# Patient Record
Sex: Male | Born: 1995 | Race: White | Hispanic: No | Marital: Single | Smoking: Former smoker
Health system: Southern US, Community
[De-identification: ages and names within clinical notes are randomized; demographics above are authoritative.]

## PROBLEM LIST (undated history)

## (undated) DIAGNOSIS — K219 Gastro-esophageal reflux disease without esophagitis: Secondary | ICD-10-CM

## (undated) DIAGNOSIS — J45909 Unspecified asthma, uncomplicated: Secondary | ICD-10-CM

## (undated) HISTORY — PX: ESOPHAGUS SURGERY: SHX626

---
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
## 2013-08-25 ENCOUNTER — Emergency Department (HOSPITAL_COMMUNITY)
Admission: EM | Admit: 2013-08-25 | Discharge: 2013-08-25 | Disposition: A | Discharge status: Home / Self Care | Payer: Self-pay | Attending: Emergency Medicine | Admitting: Emergency Medicine

## 2013-08-25 ENCOUNTER — Encounter (HOSPITAL_COMMUNITY): Payer: Self-pay | Admitting: Emergency Medicine

## 2013-08-25 DIAGNOSIS — L089 Local infection of the skin and subcutaneous tissue, unspecified: Secondary | ICD-10-CM | POA: Insufficient documentation

## 2013-08-25 DIAGNOSIS — Y929 Unspecified place or not applicable: Secondary | ICD-10-CM | POA: Insufficient documentation

## 2013-08-25 DIAGNOSIS — S80869A Insect bite (nonvenomous), unspecified lower leg, initial encounter: Principal | ICD-10-CM

## 2013-08-25 DIAGNOSIS — W57XXXA Bitten or stung by nonvenomous insect and other nonvenomous arthropods, initial encounter: Principal | ICD-10-CM

## 2013-08-25 DIAGNOSIS — L03119 Cellulitis of unspecified part of limb: Secondary | ICD-10-CM

## 2013-08-25 DIAGNOSIS — Y939 Activity, unspecified: Secondary | ICD-10-CM | POA: Insufficient documentation

## 2013-08-25 DIAGNOSIS — L02419 Cutaneous abscess of limb, unspecified: Secondary | ICD-10-CM | POA: Insufficient documentation

## 2013-08-25 DIAGNOSIS — F17200 Nicotine dependence, unspecified, uncomplicated: Secondary | ICD-10-CM | POA: Insufficient documentation

## 2013-08-25 MED ORDER — SULFAMETHOXAZOLE-TRIMETHOPRIM 800-160 MG PO TABS: 1.0000 | ORAL_TABLET | Freq: Two times a day (BID) | ORAL | Status: DC

## 2013-08-25 MED ORDER — CEPHALEXIN 500 MG PO CAPS: 500.0000 mg | ORAL_CAPSULE | Freq: Four times a day (QID) | ORAL | Status: DC

## 2013-08-25 MED ORDER — HYDROCODONE-ACETAMINOPHEN 5-325 MG PO TABS: 1.0000 | ORAL_TABLET | Freq: Four times a day (QID) | ORAL | Status: DC | PRN

## 2013-08-25 NOTE — Discharge Instructions (Signed)
Followup with your doctor, the ED, or an urgent care in 48-72 hours to have your wound checked. You may return to the emergency department if you have  a fever that persists greater than 101 or your abscess appears to become infected (growing surrounding redness and warmth). Do not operate any heavy machinery while on pain medications. Do not consume alcohol on these medications either.  Abscess An abscess (boil or furuncle) is an infected area that contains a collection of pus.  SYMPTOMS Signs and symptoms of an abscess include pain, tenderness, redness, or hardness. You may feel a moveable soft area under your skin. An abscess can occur anywhere in the body.  TREATMENT  A surgical cut (incision) may be made over your abscess to drain the pus. Gauze may be packed into the space or a drain may be looped through the abscess cavity (pocket). This provides a drain that will allow the cavity to heal from the inside outwards. The abscess may be painful for a few days, but should feel much better if it was drained.  Your abscess, if seen early, may not have localized and may not have been drained. If not, another appointment may be required if it does not get better on its own or with medications. HOME CARE INSTRUCTIONS   Only take over-the-counter or prescription medicines for pain, discomfort, or fever as directed by your caregiver.   Take your antibiotics as directed if they were prescribed. Finish them even if you start to feel better.   Keep the skin and clothes clean around your abscess.   If the abscess was drained, you will need to use gauze dressing to collect any draining pus. Dressings will typically need to be changed 3 or more times a day.   The infection may spread by skin contact with others. Avoid skin contact as much as possible.   Practice good hygiene. This includes regular hand washing, cover any draining skin lesions, and do not share personal care items.   If you participate in  sports, do not share athletic equipment, towels, whirlpools, or personal care items. Shower after every practice or tournament.   If a draining area cannot be adequately covered:   Do not participate in sports.   Children should not participate in day care until the wound has healed or drainage stops.   If your caregiver has given you a follow-up appointment, it is very important to keep that appointment. Not keeping the appointment could result in a much worse infection, chronic or permanent injury, pain, and disability. If there is any problem keeping the appointment, you must call back to this facility for assistance.  SEEK MEDICAL CARE IF:   You develop increased pain, swelling, redness, drainage, or bleeding in the wound site.   You develop signs of generalized infection including muscle aches, chills, fever, or a general ill feeling.   You have an oral temperature above 102 F (38.9 C).  MAKE SURE YOU:   Understand these instructions.   Will watch your condition.   Will get help right away if you are not doing well or get worse.  Document Released: 01/05/2005 Document Revised: 12/08/2010 Document Reviewed: 10/30/2007 [facility] Patient Information 2012 [place], [place].  Cellulitis Cellulitis is an infection of the skin and the tissue beneath it. The infected area is usually red and tender. Cellulitis occurs most often in the arms and lower legs.  CAUSES  Cellulitis is caused by bacteria that enter the skin through cracks or cuts  in the skin. The most common types of bacteria that cause cellulitis are Staphylococcus and Streptococcus. SYMPTOMS   Redness and warmth.  Swelling.  Tenderness or pain.  Fever. DIAGNOSIS  Your caregiver can usually determine what is wrong based on a physical exam. Blood tests may also be done. TREATMENT  Treatment usually involves taking an antibiotic medicine. HOME CARE INSTRUCTIONS   Take your antibiotics as directed. Finish them even if  you start to feel better.  Keep the infected arm or leg elevated to reduce swelling.  Apply a warm cloth to the affected area up to 4 times per day to relieve pain.  Only take over-the-counter or prescription medicines for pain, discomfort, or fever as directed by your caregiver.  Keep all follow-up appointments as directed by your caregiver. SEEK MEDICAL CARE IF:   You notice red streaks coming from the infected area.  Your red area gets larger or turns dark in color.  Your bone or joint underneath the infected area becomes painful after the skin has healed.  Your infection returns in the same area or another area.  You notice a swollen bump in the infected area.  You develop new symptoms. SEEK IMMEDIATE MEDICAL CARE IF:   You have a fever.  You feel very sleepy.  You develop vomiting or diarrhea.  You have a general ill feeling (malaise) with muscle aches and pains. MAKE SURE YOU:   Understand these instructions.  Will watch your condition.  Will get help right away if you are not doing well or get worse. Document Released: 01/05/2005 Document Revised: 09/27/2011 Document Reviewed: 06/13/2011 [facility] Patient Information 2014 [place], [place].

## 2013-08-25 NOTE — ED Notes (Signed)
Pt [place] on beach 1 week ago, and upon awakening found bite to right upper thigh. Area noted to be red, swelling at site. States area is progressively worsening. Pt denies fever/chills. NAD. AO x4.

## 2013-08-25 NOTE — ED Provider Notes (Signed)
CSN: [id number]     Arrival date & time 08/25/13  1622 History  This chart was scribed for non-physician practitioner, Arthor CaptainAbigail Ziyah Cordoba, PA-C working with Glynn OctaveStephen Rancour, MD by Greggory StallionKayla Andersen, ED scribe. This patient was seen in room TR09C/TR09C and the patient's care was started at 5:52 PM.   Chief Complaint  Patient presents with  . Insect Bite   The history is provided by the patient. No language interpreter was used.   HPI Comments: [name] is a 18 y.o. male who presents to the Emergency Department complaining of an insect bite to his right upper thigh that occurred one week ago. He states there is increased redness and swelling around the area. Pt states there has been some pus drainage from the area. Denies history of IV drug use.   History reviewed. No pertinent past medical history. History reviewed. No pertinent past surgical history. No family history on file. History  Substance Use Topics  . Smoking status: Current Every Day Smoker -- 0.50 packs/day    Types: Cigarettes  . Smokeless tobacco: Not on file  . Alcohol Use: Yes    Review of Systems  Constitutional: Negative for fever.  HENT: Negative for congestion.   Eyes: Negative for redness.  Respiratory: Negative for shortness of breath.   Cardiovascular: Negative for chest pain.  Gastrointestinal: Negative for abdominal distention.  Musculoskeletal: Negative for gait problem.  Skin: Negative for rash.       Abscess.   Neurological: Negative for speech difficulty.  Psychiatric/Behavioral: Negative for confusion.   Allergies  Review of patient's allergies indicates no known allergies.  Home Medications   Prior to Admission medications   Not on File   BP 127/81  Pulse 96  Temp(Src) 98.7 F (37.1 C) (Oral)  Resp 18  SpO2 99%  Physical Exam  Nursing note and vitals reviewed. Constitutional: He is oriented to person, place, and time. He appears well-developed and well-nourished. No distress.  HENT:   Head: Normocephalic and atraumatic.  Eyes: EOM are normal.  Neck: Neck supple. No tracheal deviation present.  Cardiovascular: Normal rate.   Pulmonary/Chest: Effort normal. No respiratory distress.  Musculoskeletal: Normal range of motion.  Neurological: He is alert and oriented to person, place, and time.  Skin: Skin is warm and dry.  Right thigh with 10 cm of induration surrounded by 20 cm of poorly demarcated erythema consistent with cellulitis. Tender to palpation. Expressible purulence and crusting centrally.   Psychiatric: He has a normal mood and affect. His behavior is normal.    ED Course  Procedures (including critical care time)  DIAGNOSTIC STUDIES: Oxygen Saturation is 99% on RA, normal by my interpretation.    COORDINATION OF CARE: 5:55 PM-Discussed treatment plan which includes I&D with pt at bedside and pt agreed to plan.   INCISION AND DRAINAGE Performed by: Arthor CaptainAbigail Les Longmore, PA-C Consent: Verbal consent obtained. Risks and benefits: risks, benefits and alternatives were discussed Type: abscess  Body area: right thigh  Anesthesia: local infiltration  Incision was made with a scalpel.  Local anesthetic: lidocaine 2% without epinephrine  Anesthetic total: 5 ml  Complexity: complex Blunt dissection to break up loculations  Drainage: purulent  Drainage amount: copious  Packing material: none  Patient tolerance: Patient tolerated the procedure well with no immediate complications.  Labs Review Labs Reviewed  CULTURE, ROUTINE-ABSCESS    Imaging Review No results found.   EKG Interpretation None      MDM   Final diagnoses:  Cellulitis and abscess  of leg   Patient with Abscess of the R  Thigh. I &D performed . Wound culture sent. D/c with bactrim and keflex for surrounding cellulitis. hte wound has been demarcated. patietn is to follow up in 2 days for a wound recheck.  I personally performed the services described in this documentation,  which was scribed in my presence. The recorded information has been reviewed and is accurate.  Arthor CaptainAbigail Omario Ander, PA-C 08/30/13 [phone]

## 2013-08-28 ENCOUNTER — Telehealth (HOSPITAL_BASED_OUTPATIENT_CLINIC_OR_DEPARTMENT_OTHER): Payer: Self-pay

## 2013-08-28 LAB — CULTURE, ROUTINE-ABSCESS: Special Requests: NORMAL

## 2013-08-28 NOTE — Telephone Encounter (Addendum)
[facility]  (+) MRSA.  Rx given in ED for Sulfa-Trimeth -> sensitive to the same and Keflex -> no sensitivity for this drug provided.  Call and notify pt.  General FYI set for MRSA Hx.  [facility]

## 2013-08-29 NOTE — Telephone Encounter (Signed)
Post ED Visit - Positive Culture Follow-up  Culture report reviewed by antimicrobial stewardship pharmacist: []  [name], Pharm.D., BCPS []  [name], Pharm.D., BCPS []  [name], Pharm.D., BCPS [x]  [name], [place]Pharm.D., BCPS, AAHIVP []  Estella HuskMichelle Larsen, Pharm.D., BCPS, AAHIVP []  [name], Pharm.D.  Positive abscess culture Treated with Sulfa-Trimeth, organism sensitive to the same and no further patient follow-up is required at this time.  [name] 08/29/2013, 11:50 AM

## 2013-08-30 NOTE — ED Provider Notes (Signed)
Medical screening examination/treatment/procedure(s) were performed by non-physician practitioner and as supervising physician I was immediately available for consultation/collaboration.   EKG Interpretation None       Glynn Octave, MD 08/30/13 1052

## 2013-09-06 ENCOUNTER — Telehealth (HOSPITAL_BASED_OUTPATIENT_CLINIC_OR_DEPARTMENT_OTHER): Payer: Self-pay | Admitting: Emergency Medicine

## 2013-09-06 NOTE — Telephone Encounter (Signed)
patient return call after receiving letter. ID verified. Notified of + abscess culture +MRSA. and that treated with prescribed Bactrim. Patient reports he is improved.

## 2014-12-18 ENCOUNTER — Emergency Department (HOSPITAL_COMMUNITY)
Admission: EM | Admit: 2014-12-18 | Discharge: 2014-12-18 | Disposition: A | Discharge status: Home / Self Care | Payer: Self-pay | Attending: Emergency Medicine | Admitting: Emergency Medicine

## 2014-12-18 ENCOUNTER — Emergency Department (HOSPITAL_COMMUNITY): Payer: Self-pay

## 2014-12-18 ENCOUNTER — Encounter (HOSPITAL_COMMUNITY): Payer: Self-pay | Admitting: Emergency Medicine

## 2014-12-18 DIAGNOSIS — R071 Chest pain on breathing: Secondary | ICD-10-CM

## 2014-12-18 DIAGNOSIS — Z792 Long term (current) use of antibiotics: Secondary | ICD-10-CM | POA: Insufficient documentation

## 2014-12-18 DIAGNOSIS — Z720 Tobacco use: Secondary | ICD-10-CM | POA: Insufficient documentation

## 2014-12-18 DIAGNOSIS — F1210 Cannabis abuse, uncomplicated: Secondary | ICD-10-CM | POA: Insufficient documentation

## 2014-12-18 DIAGNOSIS — R079 Chest pain, unspecified: Secondary | ICD-10-CM | POA: Insufficient documentation

## 2014-12-18 LAB — URINALYSIS, ROUTINE W REFLEX MICROSCOPIC
BILIRUBIN URINE: NEGATIVE
Glucose, UA: NEGATIVE mg/dL
Hgb urine dipstick: NEGATIVE
Ketones, ur: NEGATIVE mg/dL
Leukocytes, UA: NEGATIVE
Nitrite: NEGATIVE
PH: 6.5 (ref 5.0–8.0)
Protein, ur: NEGATIVE mg/dL
Specific Gravity, Urine: 1.026 (ref 1.005–1.030)
Urobilinogen, UA: 1 mg/dL (ref 0.0–1.0)

## 2014-12-18 LAB — RAPID URINE DRUG SCREEN, HOSP PERFORMED
Amphetamines: NOT DETECTED
BARBITURATES: NOT DETECTED
Benzodiazepines: NOT DETECTED
Cocaine: NOT DETECTED
Opiates: NOT DETECTED
TETRAHYDROCANNABINOL: POSITIVE — AB

## 2014-12-18 LAB — BASIC METABOLIC PANEL
Anion gap: 7 (ref 5–15)
BUN: 16 mg/dL (ref 6–20)
CO2: 31 mmol/L (ref 22–32)
Calcium: 10 mg/dL (ref 8.9–10.3)
Chloride: 99 mmol/L — ABNORMAL LOW (ref 101–111)
Creatinine, Ser: 0.98 mg/dL (ref 0.61–1.24)
GFR calc Af Amer: >60 mL/min (ref >60)
Glucose, Bld: 80 mg/dL (ref 65–99)
Potassium: 4.2 mmol/L (ref 3.5–5.1)
Sodium: 137 mmol/L (ref 135–145)

## 2014-12-18 LAB — CBC WITH DIFFERENTIAL/PLATELET
BASOS ABS: 0 10*3/uL (ref 0.0–0.1)
Basophils Relative: 0 % (ref 0–1)
Eosinophils Absolute: 0.2 10*3/uL (ref 0.0–0.7)
Eosinophils Relative: 3 % (ref 0–5)
HCT: 47.4 % (ref 39.0–52.0)
Hemoglobin: 16.5 g/dL (ref 13.0–17.0)
Lymphocytes Relative: 48 % — ABNORMAL HIGH (ref 12–46)
Lymphs Abs: 2.8 10*3/uL (ref 0.7–4.0)
MCH: 31.2 pg (ref 26.0–34.0)
MCHC: 34.8 g/dL (ref 30.0–36.0)
MCV: 89.6 fL (ref 78.0–100.0)
Monocytes Absolute: 0.6 10*3/uL (ref 0.1–1.0)
Monocytes Relative: 10 % (ref 3–12)
Neutro Abs: 2.3 10*3/uL (ref 1.7–7.7)
Neutrophils Relative %: 39 % — ABNORMAL LOW (ref 43–77)
PLATELETS: 192 10*3/uL (ref 150–400)
RBC: 5.29 MIL/uL (ref 4.22–5.81)
RDW: 12.5 % (ref 11.5–15.5)
WBC: 5.8 10*3/uL (ref 4.0–10.5)

## 2014-12-18 LAB — TSH: TSH: 0.866 u[IU]/mL (ref 0.350–4.500)

## 2014-12-18 LAB — TROPONIN I

## 2014-12-18 MED ORDER — IBUPROFEN 800 MG PO TABS: 800.0000 mg | ORAL_TABLET | Freq: Three times a day (TID) | ORAL | Status: DC

## 2014-12-18 NOTE — Discharge Instructions (Signed)
1. Medications: ibuprofen, usual home medications 2. Treatment: rest, drink plenty of fluids  3. Follow Up: please followup with your primary doctor  for discussion of your diagnoses and further evaluation after today's visit; if you do not have a primary care doctor use the resource guide provided to find one; please followup with cardiology if symptoms do not improve in 3-4 days; please return to the ER for severe chest pain, shortness of breath, fever   Chest Pain (Nonspecific) It is often hard to give a diagnosis for the cause of chest pain. There is always a chance that your pain could be related to something serious, such as a heart attack or a blood clot in the lungs. You need to follow up with your doctor. HOME CARE  If antibiotic medicine was given, take it as directed by your doctor. Finish the medicine even if you start to feel better.  For the next few days, avoid activities that bring on chest pain. Continue physical activities as told by your doctor.  Do not use any tobacco products. This includes cigarettes, chewing tobacco, and e-cigarettes.  Avoid drinking alcohol.  Only take medicine as told by your doctor.  Follow your doctor's suggestions for more testing if your chest pain does not go away.  Keep all doctor visits you made. GET HELP IF:  Your chest pain does not go away, even after treatment.  You have a rash with blisters on your chest.  You have a fever. GET HELP RIGHT AWAY IF:   You have more pain or pain that spreads to your arm, neck, jaw, back, or belly (abdomen).  You have shortness of breath.  You cough more than usual or cough up blood.  You have very bad back or belly pain.  You feel sick to your stomach (nauseous) or throw up (vomit).  You have very bad weakness.  You pass out (faint).  You have chills. This is an emergency. Do not wait to see if the problems will go away. Call your local emergency services (911 in U.S.). Do not drive  yourself to the hospital. MAKE SURE YOU:   Understand these instructions.  Will watch your condition.  Will get help right away if you are not doing well or get worse. Document Released: 09/14/2007 Document Revised: 04/02/2013 Document Reviewed: 09/14/2007 [facility] Patient Information 2015 [place], [place]. This information is not intended to replace advice given to you by your health care provider. Make sure you discuss any questions you have with your health care provider.   Emergency Department Resource Guide 1) Find a Doctor and Pay Out of Pocket Although you won't have to find out who is covered by your insurance plan, it is a good idea to ask around and get recommendations. You will then need to call the office and see if the doctor you have chosen will accept you as a new patient and what types of options they offer for patients who are self-pay. Some doctors offer discounts or will set up payment plans for their patients who do not have insurance, but you will need to ask so you aren't surprised when you get to your appointment.  2) Contact Your Local Health Department Not all health departments have doctors that can see patients for sick visits, but many do, so it is worth a call to see if yours does. If you don't know where your local health department is, you can check in your phone book. The CDC also has a tool  to help you locate your state's health department, and many state websites also have listings of all of their local health departments.  3) Find a Walk-in Clinic If your illness is not likely to be very severe or complicated, you may want to try a walk in clinic. These are popping up all over the country in pharmacies, drugstores, and shopping centers. They're usually staffed by nurse practitioners or physician assistants that have been trained to treat common illnesses and complaints. They're usually fairly quick and inexpensive. However, if you have serious medical issues or  chronic medical problems, these are probably not your best option.  No Primary Care Doctor: - Call Health Connect at  [phone] - they can help you locate a primary care doctor that  accepts your insurance, provides certain services, etc. - Physician Referral Service- [phone]  Chronic Pain Problems: Organization         Address  Phone   Notes  [facility]  [phone] Patients need to be referred by their primary care doctor.   Medication Assistance: Organization         Address  Phone   Notes  [facility] Medication [facility] [address] [place] [place], [place] [address] [phone] --Must be a resident of [facility] -- Must have NO insurance coverage whatsoever (no Medicaid/ Medicare, etc.) -- The pt. MUST have a primary care doctor that directs their care regularly and follows them in the community   MedAssist  [phone]   [employer]  [phone]    Agencies that provide inexpensive medical care: Organization         Address  Phone   Notes  [facility]  [phone]   [facility]    [phone]   [facility] [address] [place], [place] [address] [phone]   [facility] [address] [place] [place] [phone]   Planned Parenthood    [phone]   [facility]    [phone]   Community Health and [facility]  [address], [place] Phone:  [phone], Fax:  [phone] Hours of Operation:  9 am - 6 pm, M-F.  Also accepts Medicaid/Medicare and self-pay.  [facility]  [address], [place] Phone: [phone], Fax: [phone]. Hours of Operation:  8:30 am - 5:30 pm, M-F.  Also accepts Medicaid and self-pay.  [facility] [address], [place] Phone: [phone]   [facility] [address] Natasha Bence [place], [place]  [phone], Ext. [phone] Mondays & Thursdays: 7-9 AM.  First 15 patients are seen on a first come, first serve basis.    Medicaid-accepting [facility] Providers:  Organization         Address  Phone   Notes  [facility] [address], [place] [phone] Also accepts self-pay patients.  [facility] [address] Laurell Josephs [place], [place]  [phone]   [facility] [address], [place] [phone]   [facility] [address], [place] [phone]   Renaye Rakers [address], [place]   [phone] Only accepts [place] Access [place] patients after they have their name applied to their card.   Self-Pay (no insurance) in [place]:  Organization         Address  Phone   Notes  Sickle Cell Patients, [facility] Internal Medicine [place] [phone]   [facility] Care [place] [phone]   [facility] [place]  [place], [place] [phone]   [facility]/Dr. Osei-Bonsu  [address], [place] or [address], [place] [phone] Phone number for both [place] and [place] locations is the same.  Urgent Medical and [facility] [address] [place] [phone]   [facility] [address] [place] or [address] [phone]   [facility] [address], [place] [phone], phone; [phone], fax Sees patients 1st and 3rd Saturday of every month.  Must not qualify for public or private insurance (i.e. Medicaid, Medicare, [place] Health Choice, Veterans' Benefits)  Household income should be no more than 200% of the poverty level The clinic cannot treat you if you are pregnant or think you are pregnant  Sexually transmitted  diseases are not treated at the clinic.    Dental Care: Organization         Address  Phone  Notes  [facility] Department of [facility] [phone] Accepts children up to age 52 who are enrolled in [place] or [place]; pregnant women with a Medicaid card; and children who have applied for Medicaid or [place] Health Choice, but were declined, whose parents can pay a reduced fee at time of service.  [facility] Department of [facility]  [address], [place] [phone] Accepts children up to age 45 who are enrolled in [place] or [place]; pregnant women with a Medicaid card; and children who have applied for Medicaid or [place] Health Choice, but were declined, whose parents can pay a reduced fee at time of service.  [place] Adult [facility] Access PROGRAM  [place] [phone] Patients are seen by appointment only. Walk-ins are not accepted. [place] will see patients 47 years of age and older. Monday - Tuesday (8am-5pm) Most Wednesdays (8:30-5pm) $30 per visit, cash only  [facility] Adult Dental Access PROGRAM  [address], [facility] [phone] Patients are seen by appointment only. Walk-ins are not accepted. [place] will see patients 20 years of age and older. One Wednesday Evening (Monthly: Volunteer Based).  $30 per visit, cash only  [place]  [phone] for adults; Children under age 27, call Graduate Pediatric Dentistry at [phone]. Children aged 77-14, please call [phone] to request a pediatric application.  Dental services are provided in all areas of dental care including fillings, crowns and bridges, complete and partial dentures, implants, gum treatment, root canals, and extractions. Preventive care is also provided. Treatment is provided to both adults and children. Patients are selected via a  lottery and there is often a waiting list.   [facility] [address], [place]  [phone] [email or web address]   [facility] [address] [place], [place] [phone], Ext. [phone] Second and Fourth Thursday of each month, opens at 6:30 AM; Clinic ends at 9 AM.  Patients are seen on a first-come first-served basis, and a limited number are seen during each clinic.  [facility]  [address] Hillard Danker [place], [place] [phone]   Eligibility Requirements You must have lived in [place], [place], or [place] counties for at least the last three months.   You cannot be eligible for state or federal sponsored [employer], including [employer], [place], or [employer].   You generally cannot be eligible for healthcare insurance through your employer.    How to apply: Eligibility screenings are held every Tuesday and Wednesday afternoon from 1:00 pm until 4:00 pm. You do not need an appointment for the interview!  [facility] [address], [place], [place]   [place]  [place] Department  [place]  [phone]    Behavioral Health Resources in the Community: Intensive Outpatient Programs Organization         Address  Phone  Notes  [place]. [address], [place], [place] [phone]   [facility]patient [address] [place], [place]   ADS: Alcohol & Drug S[facility] [address] [place], [place]   [place] [address]  [place], [place] or [phone]   Substance Abuse Resources Organization         Address  Phone  Notes  Alcohol and Drug Services  [phone]   [place]  [phone]   [facility] [place]   Chinita Pester  [phone]   Residential &  Outpatient Substance Abuse Program  [phone]   Psychological Services Organization         Address  Phone  Notes  [facility]  [place]  [phone]   [place] [address], [place] or [phone]    Mobile Crisis Teams Organization         Address  Phone  Notes  [facility], Mobile Crisis Care Unit  [phone]   [facility]  [address] [place], [place]   [place] [address] [place] [phone]    Self-Help/Support Groups Organization         Address  Phone             Notes  [place]. of [place] - variety of support groups  [place] Call for more information  Narcotics Anonymous (NA), Caring [facility] [address], [employer] [place]  2 meetings at this location   Special educational needs teacher         Address  Phone  Notes  ASAP Residential Treatment [place],    [place]  [phone]   [facility]  [address] [place] [address], [place] [phone], [place], [place]   [place], [place] [phone] Admissions: 8am-3pm M-F  Incentives Substance [place] [address],    [place], [place] [phone]   [facility] [address] Jadene Pierini [place], [place]   [facility] [address],  [place], [place]   [facility]ight Programs - Intensive Outpatient [address] Kristeen [address], [place], [place]   [facility] ([place].) [place].,  [place], [place] or [phone]   [facility] (RTS) [address] [place], [place] Accepts Medicaid  [facility] [place] [address],  [place] [phone] Substance Abuse/Addiction Treatment  [facility] Organization          Address  Phone  Notes  [facility]  [phone]   Angie Fava, PhD [address] Ervin Knack [place], [place]   [phone] or [phone]   [facility] Behavioral   [address] [employer], [place] [phone]   [facility] [address] [place], [place] [phone] Insurance/Medicaid/sponsorship through [facility] and Families [address]                                    [place], [place] [phone] Therapy/tele-psych/case  [facility] [address], [place] [phone]    Dr. Lolly Mustache  [phone]   [facility]  [facility] Dept. 1) [address] [place] 2) [address], [place] 3)  [address] [place] [address], [place] [phone]  [phone]   [facility] Child Abuse Hotline 417-067-2668 or [phone] (After Hours)

## 2014-12-18 NOTE — ED Notes (Signed)
Patient states intermittent left sided chest pain associated with activity

## 2014-12-18 NOTE — ED Notes (Signed)
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 Smoker.

## 2014-12-18 NOTE — ED Notes (Signed)
Patient transported to X-ray 

## 2014-12-18 NOTE — Consult Note (Signed)
CARDIOLOGY CONSULT NOTE   Patient ID: [name] MRN: [id number] DOB/AGE: [birth year] 19 y.o.  Admit date: 12/18/2014  Primary Physician   No PCP Per Patient Primary Cardiologist   New Reason for Consultation   Chest pain  HPI: [name] is a 19 y.o. male with no significant past medical history except for his substance abuse. He smoke cigarette  half a pack a day x 5 years. Last cocaine use 1 month ago, last marijuana use last night. He works outside [place] and dose heavy lifting. Intermittently he has been having sharp left-sided chest pain with exertion for the past few weeks. No shortness of breath. No chest pain at rest. His chest pain are precipitated by deep breath. He denies fever, cough, recent illness, shortness of breath, lower extremity edema, palpitations, nausea, vomiting, orthopnea or PND. No recent trauma or travel.   He had a cystourethrogram and renal ultrasound done 10 years ago for urgent care. No history of syncope. No history of surgery or malignancy. He does not know any family history of heart disease, hypertension or diabetes.  In ED, his lab woks are unremarkable, Trop I x 1 negative. EKG with NSR with PVCs and incomplete RBBB. Tele showed ventricular bigeminy with rate of 40-80s. UDS positive for Marijuana. Cxr without acute abnormality. UA negative.     No Known Allergies  I have reviewed the patient's current medications     Prior to Admission medications   Medication Sig Start Date End Date Taking? Authorizing Provider  HYDROcodone-acetaminophen (NORCO) 5-325 MG per tablet Take 1-2 tablets by mouth every 6 (six) hours as needed for moderate pain. 08/25/13  Yes Arthor Captain, PA-C  cephALEXin (KEFLEX) 500 MG capsule Take 1 capsule (500 mg total) by mouth 4 (four) times daily. Patient not taking: Reported on 12/18/2014 08/25/13   Arthor Captain, PA-C  sulfamethoxazole-trimethoprim (SEPTRA DS) 800-160 MG per tablet Take 1 tablet by mouth every 12  (twelve) hours. Patient not taking: Reported on 12/18/2014 08/25/13   Arthor Captain, PA-C     Social History   Social History  . Marital Status: Married    Spouse Name: N/A  . Number of Children: N/A  . Years of Education: N/A   Occupational History  . Not on file.   Social History Main Topics  . Smoking status: Current Every Day Smoker -- 0.50 packs/day    Types: Cigarettes  . Smokeless tobacco: Not on file  . Alcohol Use: Yes  . Drug Use: Not on file  . Sexual Activity: Yes   Other Topics Concern  . Not on file   Social History Narrative    Patient dose not know about any family history.   ROS:  Full 14 point review of systems complete and found to be negative unless listed above.  Physical Exam: Blood pressure 104/56, pulse 29, temperature 97.4 F (36.3 C), temperature source Oral, resp. rate 16, SpO2 95 %.  General: Well developed, well nourished, male in no acute distress Head: Eyes PERRLA, No xanthomas. Normocephalic and atraumatic, oropharynx without edema or exudate.  Lungs: Resp regular and unlabored, CTA. Heart: RRR no s3, s4, or murmurs..   Neck: No carotid bruits. No lymphadenopathy.  No JVD. Abdomen: Bowel sounds present, abdomen soft and non-tender without masses or hernias noted. Msk:  No spine or cva tenderness. No weakness, no joint deformities or effusions. Extremities: No clubbing, cyanosis or edema. DP/PT/Radials 2+ and equal bilaterally. Neuro: Alert and oriented X 3. No focal  deficits noted. Psych:  Good affect, responds appropriately Skin: No rashes or lesions noted.  Labs:   Lab Results  Component Value Date   WBC 5.8 12/18/2014   HGB 16.5 12/18/2014   HCT 47.4 12/18/2014   MCV 89.6 12/18/2014   PLT 192 12/18/2014   No results for input(s): INR in the last 72 hours.  Recent Labs Lab 12/18/14 1231  NA 137  K 4.2  CL 99*  CO2 31  BUN 16  CREATININE 0.98  CALCIUM 10.0  GLUCOSE 80    ECG:   Vent. rate 92 BPM PR interval 132  ms QRS duration 94 ms QT/QTc 380/469 ms P-R-T axes 78 111 72  Radiology:  Dg Chest 2 View  12/18/2014   CLINICAL DATA:  Dyspnea, smoker  EXAM: CHEST  2 VIEW  COMPARISON:  None.  FINDINGS: The heart size and mediastinal contours are within normal limits. Both lungs are clear. The visualized skeletal structures are unremarkable.  IMPRESSION: No active cardiopulmonary disease.   Electronically Signed   By: Natasha Mead M.D.   On: 12/18/2014 12:59    ASSESSMENT AND PLAN:     1. Atypical chest pain - Seems pleuritic vs MSK  that worse with deep breath. Low suspicious for PE or cardiac etiology. Trop I x 1 negative. EKG with NSR with PVCs and incomplete RBBB. Tele showed ventricular bigeminy with rate of 40-80s and frequent PVCs. CXR without acute abnormality.    2. Polysubstance abuse - At extensive discussion (15-20 minutes) about cessation of tobacco, cocaine and marijuana cessation. Patient understands risk at such a young age. He is willing to quit. - UDS positive for Marijuana.   SignedManson Passey, PA 12/18/2014, 2:59 PM Pager [phone]  Co-Sign MD  Patient seen with PA, agree with the above note. 1. Chest pain: Began after swinging heavy pallets onto a truck.  Pleuritic, only present with deep breathing and sneezing.  Localizes to a small area of his left chest.  No friction rub or evidence for pericarditis on ECG.  CXR, troponin ok.  I suspect this is musculoskeletal.  If it does not resolve in 3-4 more days, we can do an outpatient echo.  2. PVCs: PVCs noted on telemetry and ECG.  He has a large, fully consumed Twitch energy drink on bedside table.  No cocaine on urine drug screen.  Advised him to avoid stimulants => energy drinks, cocaine, etc.   Marca Ancona 12/18/2014 3:29 PM

## 2014-12-18 NOTE — ED Provider Notes (Signed)
CSN: [id number]     Arrival date & time 12/18/14  0940 History   First MD Initiated Contact with Patient 12/18/14 1123     Chief Complaint  Patient presents with  . Chest Pain     HPI   [name] is a 19 y.o. male with a PMH of tobacco use who presents to the ED with chest pain x 3 days. Reports his pain over the left side of his chest and feels like pressure. At rest, has no chest pain, however symptoms are precipitated by deep inspiration. He also reports symptoms when he is exerting himself at work. 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 He has not tried anything for symptom relief. He states this has never happened to him before. Denies fever, chills, HA, lightheadedness, dizziness, cough, congestion, shortness of breath, palpitations, abdominal pain, vomiting, diarrhea, constipation. Reports intermittent nausea. Denies weakness, numbness, paresthesia. States he smokes 1/2 pack per day x 5 years. Denies history of malignancy, history of DVT, recent travel or immobility.   History reviewed. No pertinent past medical history. History reviewed. No pertinent past surgical history. History reviewed. No pertinent family history.   Social History  Substance Use Topics  . Smoking status: Current Every Day Smoker -- 0.50 packs/day    Types: Cigarettes  . Smokeless tobacco: None  . Alcohol Use: Yes     Review of Systems  Constitutional: Negative for fever, chills, activity change, appetite change and fatigue.  HENT: Negative for congestion.   Respiratory: Positive for chest tightness. Negative for cough and shortness of breath.   Cardiovascular: Positive for chest pain. Negative for palpitations and leg swelling.  Gastrointestinal: Negative for nausea, vomiting, abdominal pain, diarrhea and constipation.  Musculoskeletal: Negative for myalgias, back pain, arthralgias, neck pain and neck stiffness.  Skin: Negative for color change, pallor, rash and wound.  Neurological:  Negative for dizziness, syncope, weakness, light-headedness, numbness and headaches.  All other systems reviewed and are negative.     Allergies  Review of patient's allergies indicates no known allergies.  Home Medications   Prior to Admission medications   Medication Sig Start Date End Date Taking? Authorizing Provider  cephALEXin (KEFLEX) 500 MG capsule Take 1 capsule (500 mg total) by mouth 4 (four) times daily. 08/25/13   Arthor Captain, PA-C  HYDROcodone-acetaminophen (NORCO) 5-325 MG per tablet Take 1-2 tablets by mouth every 6 (six) hours as needed for moderate pain. 08/25/13   Arthor Captain, PA-C  sulfamethoxazole-trimethoprim (SEPTRA DS) 800-160 MG per tablet Take 1 tablet by mouth every 12 (twelve) hours. 08/25/13   Abigail Harris, PA-C    BP 104/59 mmHg  Pulse 81  Temp(Src) 97.4 F (36.3 C) (Oral)  Resp 18  SpO2 98% Physical Exam  Constitutional: He is oriented to person, place, and time. He appears well-developed and well-nourished. No distress.  HENT:  Head: Normocephalic and atraumatic.  Right Ear: External ear normal.  Left Ear: External ear normal.  Nose: Nose normal.  Mouth/Throat: Uvula is midline, oropharynx is clear and moist and mucous membranes are normal.  Eyes: Conjunctivae, EOM and lids are normal. Pupils are equal, round, and reactive to light. Right eye exhibits no discharge. Left eye exhibits no discharge. No scleral icterus.  Neck: Normal range of motion. Neck supple.  Cardiovascular: Normal rate, regular rhythm, intact distal pulses and normal pulses.   Regular rate and rhythm with occasional ectopy. Split S2.  Pulmonary/Chest: Effort normal and breath sounds normal. No respiratory distress. He  has no wheezes. He has no rales. He exhibits no tenderness.  Abdominal: Soft. Normal appearance and bowel sounds are normal. He exhibits no distension and no mass. There is no tenderness. There is no rigidity, no rebound and no guarding.  Musculoskeletal:  Normal range of motion. He exhibits no edema or tenderness.  Neurological: He is alert and oriented to person, place, and time. He has normal strength.  Skin: Skin is warm, dry and intact. No rash noted. He is not diaphoretic. No erythema. No pallor.  Psychiatric: He has a normal mood and affect. His speech is normal and behavior is normal. Judgment and thought content normal.  Nursing note and vitals reviewed.   ED Course  Procedures (including critical care time)  Labs Review Labs Reviewed  CBC WITH DIFFERENTIAL/PLATELET - Abnormal; Notable for the following:    Neutrophils Relative % 39 (*)    Lymphocytes Relative 48 (*)    All other components within normal limits  BASIC METABOLIC PANEL - Abnormal; Notable for the following:    Chloride 99 (*)    All other components within normal limits  URINALYSIS, ROUTINE W REFLEX MICROSCOPIC (NOT AT [facility]) - Abnormal; Notable for the following:    APPearance HAZY (*)    All other components within normal limits  URINE RAPID DRUG SCREEN, HOSP PERFORMED - Abnormal; Notable for the following:    Tetrahydrocannabinol POSITIVE (*)    All other components within normal limits  TROPONIN I  TSH    Imaging Review Dg Chest 2 View  12/18/2014   CLINICAL DATA:  Dyspnea, smoker  EXAM: CHEST  2 VIEW  COMPARISON:  None.  FINDINGS: The heart size and mediastinal contours are within normal limits. Both lungs are clear. The visualized skeletal structures are unremarkable.  IMPRESSION: No active cardiopulmonary disease.   Electronically Signed   By: Natasha Mead M.D.   On: 12/18/2014 12:59     I have personally reviewed and evaluated these images and lab results as part of my medical decision-making.   EKG Interpretation   Date/Time:  Thursday December 18 2014 09:57:05 EDT Ventricular Rate:  87 PR Interval:  132 QRS Duration: 94 QT Interval:  378 QTC Calculation: 454 R Axis:   117 Text Interpretation:  Sinus rhythm with sinus arrhythmia with frequent   Premature ventricular complexes Right atrial enlargement Right axis  deviation Pulmonary disease pattern Incomplete right bundle branch block  Abnormal ECG agree. 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 Denies fever, chills, HA, lightheadedness, dizziness, cough, congestion, shortness of breath, palpitations, abdominal pain, vomiting, diarrhea, constipation, weakness, numbness, paresthesia. States he smokes 1/2 pack per day x 5 years. Denies history of malignancy, history of DVT, recent travel or immobility.  Patient is afebrile. Vital signs stable. Regular rate and rhythm with occasional ectopy and split S2. Lungs clear to auscultation bilaterally. Chest wall non-tender to palpation. Abdomen soft, non-tender, non-distended. No lower extremity edema or TTP.  EKG with frequent PVCs, right atrial enlargement, right axis deviation. Troponin negative x 1. No evidence of ACS or pericarditis. CBC, BMP unremarkable. TSH within normal limits. UA negative for infection, UDS positive for THC. CXR negative for active cardiopulmonary disease.   Cardiology consulted; per note, feel that  chest pain is likely musculoskeletal in etiology with low suspicion for PE or cardiac source of symptoms; if pain does not resolve, patient to follow up with cardiology for outpatient echo; patient counseled on smoking and drug cessation and advised not to consume stimulants.  Patient to follow-up with PCP. Return precautions discussed.   BP 102/76 mmHg  Pulse 60  Temp(Src) 97.4 F (36.3 C) (Oral)  Resp 18  SpO2 99%       Mady Gemma, PA-C 12/18/14 1701  Arby Barrette, MD 12/18/14 1752

## 2017-07-03 ENCOUNTER — Ambulatory Visit (HOSPITAL_COMMUNITY)
Admission: EM | Admit: 2017-07-03 | Discharge: 2017-07-03 | Disposition: A | Discharge status: Home / Self Care | Payer: Self-pay | Attending: Family Medicine | Admitting: Family Medicine

## 2017-07-03 ENCOUNTER — Encounter (HOSPITAL_COMMUNITY): Payer: Self-pay | Admitting: Family Medicine

## 2017-07-03 DIAGNOSIS — R69 Illness, unspecified: Secondary | ICD-10-CM

## 2017-07-03 DIAGNOSIS — J111 Influenza due to unidentified influenza virus with other respiratory manifestations: Secondary | ICD-10-CM

## 2017-07-03 MED ORDER — OSELTAMIVIR PHOSPHATE 75 MG PO CAPS: 75.0000 mg | ORAL_CAPSULE | Freq: Two times a day (BID) | ORAL | 0 refills | Status: DC

## 2017-07-03 NOTE — Discharge Instructions (Signed)
If this does not get better, you need to find a PCP to follow up. Even if it does get better, it is not a bad idea to find one.  OK to take Tylenol 1000 mg (2 extra strength tabs) or 975 mg (3 regular strength tabs) every 6 hours as needed.  Ibuprofen 400-600 mg (2-3 over the counter strength tabs) every 6 hours as needed for pain.  Continue to push fluids, practice good hand hygiene, and cover your mouth if you cough.  If you start having fevers, shaking or shortness of breath, seek immediate care.

## 2017-07-03 NOTE — ED Triage Notes (Signed)
Pt here for flu like symptoms he was exposed at work. Sweats, fatigue, loss of appetite. This has been intermittent x a few weeks.

## 2017-07-03 NOTE — ED Provider Notes (Signed)
  [facility]    CSN: [id number] Arrival date & time: 07/03/17  1840   Chief Complaint  Patient presents with  . Influenza    [name] here for URI complaints.  Duration: 1 day  Associated symptoms: sinus congestion, rhinorrhea and myalgia Denies: sinus pain, itchy watery eyes, ear pain, ear drainage, sore throat, wheezing, shortness of breath and fevers Treatment to date: none Sick contacts: Yes- coworkers  ROS:  Const: Denies fevers HEENT: As noted in HPI Lungs: No SOB  No known health hx.   BP 118/73   Pulse 70   Temp 97.7 F (36.5 C)   Resp 18   SpO2 97%  General: Awake, alert, appears stated age HEENT: AT, [place], ears patent b/l and TM's neg, nares patent w/o discharge, pharynx pink and without exudates, MMM Neck: No masses or asymmetry Heart: RRR Lungs: CTAB, no accessory muscle use Abd: Neg Psych: Age appropriate judgment and insight, normal mood and affect  Influenza-like illness  Tamiflu.  Pt has been under a lot of stress and bringing up somatic issues. I think treating for flu is reasonable, but he needs to follow up with a pcp if things do not improve. I think underlying and untreated anxiety could be a strong contributor to his issues at hand.  Continue to push fluids, practice good hand hygiene, cover mouth when coughing. F/u prn. If starting to experience fevers, shaking, or shortness of breath, seek immediate care. Pt voiced understanding and agreement to the plan.   [name], [name] Paul, [place] 07/03/17 2132

## 2017-08-02 ENCOUNTER — Ambulatory Visit (HOSPITAL_COMMUNITY)
Admission: EM | Admit: 2017-08-02 | Discharge: 2017-08-02 | Disposition: A | Discharge status: Home / Self Care | Payer: Self-pay | Attending: Family Medicine | Admitting: Family Medicine

## 2017-08-02 ENCOUNTER — Encounter (HOSPITAL_COMMUNITY): Payer: Self-pay | Admitting: Emergency Medicine

## 2017-08-02 DIAGNOSIS — F17210 Nicotine dependence, cigarettes, uncomplicated: Secondary | ICD-10-CM | POA: Insufficient documentation

## 2017-08-02 DIAGNOSIS — R5383 Other fatigue: Secondary | ICD-10-CM

## 2017-08-02 DIAGNOSIS — R1013 Epigastric pain: Secondary | ICD-10-CM | POA: Insufficient documentation

## 2017-08-02 DIAGNOSIS — M791 Myalgia, unspecified site: Secondary | ICD-10-CM

## 2017-08-02 LAB — CBC WITH DIFFERENTIAL/PLATELET
Basophils Absolute: 0 10*3/uL (ref 0.0–0.1)
Basophils Relative: 0 %
Eosinophils Absolute: 0.1 10*3/uL (ref 0.0–0.7)
Eosinophils Relative: 1 %
HCT: 43.2 % (ref 39.0–52.0)
Hemoglobin: 14.6 g/dL (ref 13.0–17.0)
Lymphocytes Relative: 51 %
Lymphs Abs: 3.2 10*3/uL (ref 0.7–4.0)
MCH: 29.6 pg (ref 26.0–34.0)
MCHC: 33.8 g/dL (ref 30.0–36.0)
MCV: 87.6 fL (ref 78.0–100.0)
Monocytes Absolute: 0.6 10*3/uL (ref 0.1–1.0)
Monocytes Relative: 10 %
Neutro Abs: 2.4 10*3/uL (ref 1.7–7.7)
Neutrophils Relative %: 38 %
Platelets: 183 10*3/uL (ref 150–400)
RBC: 4.93 MIL/uL (ref 4.22–5.81)
RDW: 12.3 % (ref 11.5–15.5)
WBC: 6.4 10*3/uL (ref 4.0–10.5)

## 2017-08-02 LAB — COMPREHENSIVE METABOLIC PANEL
ALT: 20 U/L (ref 17–63)
AST: 27 U/L (ref 15–41)
Albumin: 4.6 g/dL (ref 3.5–5.0)
Alkaline Phosphatase: 52 U/L (ref 38–126)
Anion gap: 7 (ref 5–15)
BUN: 12 mg/dL (ref 6–20)
CO2: 29 mmol/L (ref 22–32)
Calcium: 9.8 mg/dL (ref 8.9–10.3)
Chloride: 104 mmol/L (ref 101–111)
Creatinine, Ser: 0.97 mg/dL (ref 0.61–1.24)
GFR calc Af Amer: >60 mL/min (ref >60)
GFR calc non Af Amer: >60 mL/min (ref >60)
Glucose, Bld: 95 mg/dL (ref 65–99)
Potassium: 3.9 mmol/L (ref 3.5–5.1)
Sodium: 140 mmol/L (ref 135–145)
Total Bilirubin: 1.3 mg/dL — ABNORMAL HIGH (ref 0.3–1.2)
Total Protein: 7.2 g/dL (ref 6.5–8.1)

## 2017-08-02 NOTE — Discharge Instructions (Addendum)
Please return in 2 days to review the labs.  [facility]

## 2017-08-02 NOTE — ED Triage Notes (Signed)
Pt c/o epigastric pain, states "its squeezing me right there". Pt has mutiple complaints, stating "I just feel bad, im not eating, i've lost weight"

## 2017-08-02 NOTE — ED Provider Notes (Signed)
[facility]   [id number] 08/02/17 Arrival Time: 1655   SUBJECTIVE:  [name] is a 22 y.o. male who presents to the urgent care with complaint of epigastric pain, states "its squeezing me right there". Pt has mutiple complaints, stating "I just feel bad, im not eating, i've lost weight"  Patient describes an illness that began 2-3 weeks ago where he has felt weak and has diffuse muscle aches and chest pains constantly.  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  He has been crying today because he feels his body is failing.   History reviewed. No pertinent past medical history. No family history on file. Social History   Socioeconomic History  . Marital status: Married    Spouse name: Not on file  . Number of children: Not on file  . Years of education: Not on file  . Highest education level: Not on file  Occupational History  . Not on file  Social Needs  . Financial resource strain: Not on file  . Food insecurity:    Worry: Not on file    Inability: Not on file  . Transportation needs:    Medical: Not on file    Non-medical: Not on file  Tobacco Use  . Smoking status: Current Every Day Smoker    Packs/day: 0.50    Types: Cigarettes  Substance and Sexual Activity  . Alcohol use: Yes    Alcohol/week: 0.0 oz  . Drug use: Yes  . Sexual activity: Yes  Lifestyle  . Physical activity:    Days per week: Not on file    Minutes per session: Not on file  . Stress: Not on file  Relationships  . Social connections:    Talks on phone: Not on file    Gets together: Not on file    Attends religious service: Not on file    Active member of club or organization: Not on file    Attends meetings of clubs or organizations: Not on file    Relationship status: Not on file  . Intimate partner violence:    Fear of current or ex partner: Not on file    Emotionally abused: Not on file    Physically abused: Not on file   Forced sexual activity: Not on file  Other Topics Concern  . Not on file  Social History Narrative  . Not on file   No outpatient medications have been marked as taking for the 08/02/17 encounter [facility] Encounter).   No Known Allergies    ROS: As per HPI, remainder of ROS negative.   OBJECTIVE:   Vitals:   08/02/17 1716  BP: 106/61  Pulse: 61  Resp: 18  Temp: 98.3 F (36.8 C)  SpO2: 100%     General appearance: alert; no distress Eyes: PERRL; EOMI; conjunctiva normal HENT: normocephalic; atraumatic;  external ears normal without trauma; nasal mucosa normal; oral mucosa normal Neck: supple Lungs: clear to auscultation bilaterally Heart: regular rate and rhythm Abdomen: soft, non-tender; bowel sounds normal; no masses or organomegaly; no guarding or rebound tenderness Back: no CVA tenderness Extremities: no cyanosis or edema; symmetrical with no gross deformities Skin: warm and dry Neurologic: normal gait; grossly normal Psychological: alert and cooperative; normal mood and affect      Labs:  Results for orders placed or performed during the hospital encounter of 12/18/14  CBC with Differential  Result Value Ref Range   WBC 5.8 4.0 -  10.5 K/uL   RBC 5.29 4.22 - 5.81 MIL/uL   Hemoglobin 16.5 13.0 - 17.0 g/dL   HCT 69.647.4 29.539.0 - 28.452.0 %   MCV 89.6 78.0 - 100.0 fL   MCH 31.2 26.0 - 34.0 pg   MCHC 34.8 30.0 - 36.0 g/dL   RDW 13.212.5 44.011.5 - 10.215.5 %   Platelets 192 150 - 400 K/uL   Neutrophils Relative % 39 (L) 43 - 77 %   Neutro Abs 2.3 1.7 - 7.7 K/uL   Lymphocytes Relative 48 (H) 12 - 46 %   Lymphs Abs 2.8 0.7 - 4.0 K/uL   Monocytes Relative 10 3 - 12 %   Monocytes Absolute 0.6 0.1 - 1.0 K/uL   Eosinophils Relative 3 0 - 5 %   Eosinophils Absolute 0.2 0.0 - 0.7 K/uL   Basophils Relative 0 0 - 1 %   Basophils Absolute 0.0 0.0 - 0.1 K/uL  Basic metabolic panel  Result Value Ref Range   Sodium 137 135 - 145 mmol/L   Potassium 4.2 3.5 - 5.1 mmol/L   Chloride  99 (L) 101 - 111 mmol/L   CO2 31 22 - 32 mmol/L   Glucose, Bld 80 65 - 99 mg/dL   BUN 16 6 - 20 mg/dL   Creatinine, Ser 7.250.98 0.61 - 1.24 mg/dL   Calcium 36.610.0 8.9 - 44.010.3 mg/dL   GFR calc non Af Amer >60 >60 mL/min   GFR calc Af Amer >60 >60 mL/min   Anion gap 7 5 - 15  Troponin I  Result Value Ref Range   Troponin I <0.03 <0.031 ng/mL  Urinalysis, Routine w reflex microscopic  Result Value Ref Range   Color, Urine YELLOW YELLOW   APPearance HAZY (A) CLEAR   Specific Gravity, Urine 1.026 1.005 - 1.030   pH 6.5 5.0 - 8.0   Glucose, UA NEGATIVE NEGATIVE mg/dL   Hgb urine dipstick NEGATIVE NEGATIVE   Bilirubin Urine NEGATIVE NEGATIVE   Ketones, ur NEGATIVE NEGATIVE mg/dL   Protein, ur NEGATIVE NEGATIVE mg/dL   Urobilinogen, UA 1.0 0.0 - 1.0 mg/dL   Nitrite NEGATIVE NEGATIVE   Leukocytes, UA NEGATIVE NEGATIVE  Urine rapid drug screen (hosp performed)  Result Value Ref Range   Opiates NONE DETECTED NONE DETECTED   Cocaine NONE DETECTED NONE DETECTED   Benzodiazepines NONE DETECTED NONE DETECTED   Amphetamines NONE DETECTED NONE DETECTED   Tetrahydrocannabinol POSITIVE (A) NONE DETECTED   Barbiturates NONE DETECTED NONE DETECTED  TSH  Result Value Ref Range   TSH 0.866 0.350 - 4.500 uIU/mL    Labs Reviewed  CBC WITH DIFFERENTIAL/PLATELET  COMPREHENSIVE METABOLIC PANEL  HIV ANTIBODY (ROUTINE TESTING)    No results found.     ASSESSMENT & PLAN:  1. Myalgia   2. Fatigue, unspecified type     No orders of the defined types were placed in this encounter.   Reviewed expectations re: course of current medical issues. Questions answered. Outlined signs and symptoms indicating need for more acute intervention. Patient verbalized understanding. After Visit Summary given.    Procedures:      Elvina SidleLauenstein, Dontrel Smethers, MD 08/02/17 1755

## 2017-08-03 LAB — HIV ANTIBODY (ROUTINE TESTING W REFLEX): HIV Screen 4th Generation wRfx: NONREACTIVE

## 2017-08-03 LAB — POCT I-STAT, CHEM 8
BUN: 14 mg/dL (ref 6–20)
Calcium, Ion: 1.25 mmol/L (ref 1.15–1.40)
Chloride: 101 mmol/L (ref 101–111)
Creatinine, Ser: 0.9 mg/dL (ref 0.61–1.24)
Glucose, Bld: 91 mg/dL (ref 65–99)
HCT: 45 % (ref 39.0–52.0)
Hemoglobin: 15.3 g/dL (ref 13.0–17.0)
Potassium: 3.9 mmol/L (ref 3.5–5.1)
Sodium: 141 mmol/L (ref 135–145)
TCO2: 29 mmol/L (ref 22–32)

## 2017-08-04 ENCOUNTER — Encounter (HOSPITAL_COMMUNITY): Payer: Self-pay | Admitting: Family Medicine

## 2017-08-04 ENCOUNTER — Ambulatory Visit (HOSPITAL_COMMUNITY)
Admission: EM | Admit: 2017-08-04 | Discharge: 2017-08-04 | Disposition: A | Discharge status: Home / Self Care | Payer: Self-pay | Attending: Family Medicine | Admitting: Family Medicine

## 2017-08-04 DIAGNOSIS — B349 Viral infection, unspecified: Secondary | ICD-10-CM

## 2017-08-04 MED ORDER — PREDNISONE 20 MG PO TABS: ORAL_TABLET | ORAL | 0 refills | Status: DC

## 2017-08-04 NOTE — ED Triage Notes (Signed)
Pt here for lab results

## 2017-08-04 NOTE — ED Provider Notes (Signed)
[facility]   [id number] 08/04/17 Arrival Time: 1621   SUBJECTIVE:  [name] is a 22 y.o. male who presents to the urgent care with complaint of myalgia and weakness that comes on in waves.  Was seen here several days ago and labs were run     History reviewed. No pertinent past medical history. History reviewed. No pertinent family history. Social History   Socioeconomic History  . Marital status: Married    Spouse name: Not on file  . Number of children: Not on file  . Years of education: Not on file  . Highest education level: Not on file  Occupational History  . Not on file  Social Needs  . Financial resource strain: Not on file  . Food insecurity:    Worry: Not on file    Inability: Not on file  . Transportation needs:    Medical: Not on file    Non-medical: Not on file  Tobacco Use  . Smoking status: Current Every Day Smoker    Packs/day: 0.50    Types: Cigarettes  Substance and Sexual Activity  . Alcohol use: Yes    Alcohol/week: 0.0 oz  . Drug use: Yes  . Sexual activity: Yes  Lifestyle  . Physical activity:    Days per week: Not on file    Minutes per session: Not on file  . Stress: Not on file  Relationships  . Social connections:    Talks on phone: Not on file    Gets together: Not on file    Attends religious service: Not on file    Active member of club or organization: Not on file    Attends meetings of clubs or organizations: Not on file    Relationship status: Not on file  . Intimate partner violence:    Fear of current or ex partner: Not on file    Emotionally abused: Not on file    Physically abused: Not on file    Forced sexual activity: Not on file  Other Topics Concern  . Not on file  Social History Narrative  . Not on file   No outpatient medications have been marked as taking for the 08/04/17 encounter [facility] Encounter).   No Known Allergies    ROS: As per HPI, remainder of ROS  negative.   OBJECTIVE:   There were no vitals filed for this visit.   General appearance: alert; no distress Eyes: PERRL; EOMI; conjunctiva normal HENT: normocephalic; atraumatic; TMs normal, canal normal, external ears normal without trauma; nasal mucosa normal; oral mucosa normal Neck: supple Lungs: clear to auscultation bilaterally Heart: regular rate and rhythm Abdomen: soft, non-tender; bowel sounds normal; no masses or organomegaly; no guarding or rebound tenderness Back: no CVA tenderness Extremities: no cyanosis or edema; symmetrical with no gross deformities Skin: warm and dry Neurologic: normal gait; grossly normal Psychological: alert and cooperative; normal mood and affect      Labs:  Results for orders placed or performed during the hospital encounter of 08/02/17  CBC with Differential  Result Value Ref Range   WBC 6.4 4.0 - 10.5 K/uL   RBC 4.93 4.22 - 5.81 MIL/uL   Hemoglobin 14.6 13.0 - 17.0 g/dL   HCT 40.9 81.1 - 91.4 %   MCV 87.6 78.0 - 100.0 fL   MCH 29.6 26.0 - 34.0 pg   MCHC 33.8 30.0 - 36.0 g/dL   RDW 78.2 95.6 - 21.3 %   Platelets 183 150 - 400 K/uL  Neutrophils Relative % 38 %   Neutro Abs 2.4 1.7 - 7.7 K/uL   Lymphocytes Relative 51 %   Lymphs Abs 3.2 0.7 - 4.0 K/uL   Monocytes Relative 10 %   Monocytes Absolute 0.6 0.1 - 1.0 K/uL   Eosinophils Relative 1 %   Eosinophils Absolute 0.1 0.0 - 0.7 K/uL   Basophils Relative 0 %   Basophils Absolute 0.0 0.0 - 0.1 K/uL  Comprehensive metabolic panel  Result Value Ref Range   Sodium 140 135 - 145 mmol/L   Potassium 3.9 3.5 - 5.1 mmol/L   Chloride 104 101 - 111 mmol/L   CO2 29 22 - 32 mmol/L   Glucose, Bld 95 65 - 99 mg/dL   BUN 12 6 - 20 mg/dL   Creatinine, Ser 5.780.97 0.61 - 1.24 mg/dL   Calcium 9.8 8.9 - 46.910.3 mg/dL   Total Protein 7.2 6.5 - 8.1 g/dL   Albumin 4.6 3.5 - 5.0 g/dL   AST 27 15 - 41 U/L   ALT 20 17 - 63 U/L   Alkaline Phosphatase 52 38 - 126 U/L   Total Bilirubin 1.3 (H) 0.3  - 1.2 mg/dL   GFR calc non Af Amer >60 >60 mL/min   GFR calc Af Amer >60 >60 mL/min   Anion gap 7 5 - 15  HIV antibody  Result Value Ref Range   HIV Screen 4th Generation wRfx Non Reactive Non Reactive  I-STAT, chem 8  Result Value Ref Range   Sodium 141 135 - 145 mmol/L   Potassium 3.9 3.5 - 5.1 mmol/L   Chloride 101 101 - 111 mmol/L   BUN 14 6 - 20 mg/dL   Creatinine, Ser 6.290.90 0.61 - 1.24 mg/dL   Glucose, Bld 91 65 - 99 mg/dL   Calcium, Ion 5.281.25 4.131.15 - 1.40 mmol/L   TCO2 29 22 - 32 mmol/L   Hemoglobin 15.3 13.0 - 17.0 g/dL   HCT 24.445.0 01.039.0 - 27.252.0 %    Labs Reviewed - No data to display  No results found.     ASSESSMENT & PLAN:  1. Viral syndrome     Meds ordered this encounter  Medications  . predniSONE (DELTASONE) 20 MG tablet    Sig: One daily with food    Dispense:  5 tablet    Refill:  0    Reviewed expectations re: course of current medical issues. Questions answered. Outlined signs and symptoms indicating need for more acute intervention. Patient verbalized understanding. After Visit Summary given.    Procedures:      Elvina SidleLauenstein, Jeiry Birnbaum, MD 08/04/17 1715

## 2017-08-05 NOTE — Progress Notes (Signed)
Pt called and made aware of results being normal

## 2017-11-23 ENCOUNTER — Encounter (HOSPITAL_COMMUNITY): Payer: Self-pay | Admitting: Emergency Medicine

## 2017-11-23 ENCOUNTER — Emergency Department (HOSPITAL_COMMUNITY)
Admission: EM | Admit: 2017-11-23 | Discharge: 2017-11-23 | Disposition: A | Discharge status: Home / Self Care | Payer: Self-pay | Attending: Emergency Medicine | Admitting: Emergency Medicine

## 2017-11-23 ENCOUNTER — Emergency Department (HOSPITAL_COMMUNITY): Payer: Self-pay

## 2017-11-23 DIAGNOSIS — Z87891 Personal history of nicotine dependence: Secondary | ICD-10-CM | POA: Insufficient documentation

## 2017-11-23 DIAGNOSIS — R072 Precordial pain: Secondary | ICD-10-CM | POA: Insufficient documentation

## 2017-11-23 HISTORY — DX: Gastro-esophageal reflux disease without esophagitis: K21.9

## 2017-11-23 LAB — URINALYSIS, ROUTINE W REFLEX MICROSCOPIC
Bilirubin Urine: NEGATIVE
Glucose, UA: NEGATIVE mg/dL
Hgb urine dipstick: NEGATIVE
Ketones, ur: 5 mg/dL — AB
LEUKOCYTES UA: NEGATIVE
NITRITE: NEGATIVE
PROTEIN: NEGATIVE mg/dL
Specific Gravity, Urine: 1.019 (ref 1.005–1.030)
pH: 7 (ref 5.0–8.0)

## 2017-11-23 LAB — CBC
HEMATOCRIT: 49.4 % (ref 39.0–52.0)
HEMOGLOBIN: 16.7 g/dL (ref 13.0–17.0)
MCH: 29.8 pg (ref 26.0–34.0)
MCHC: 33.8 g/dL (ref 30.0–36.0)
MCV: 88.2 fL (ref 78.0–100.0)
Platelets: 213 10*3/uL (ref 150–400)
RBC: 5.6 MIL/uL (ref 4.22–5.81)
RDW: 11.9 % (ref 11.5–15.5)
WBC: 5.7 10*3/uL (ref 4.0–10.5)

## 2017-11-23 LAB — I-STAT TROPONIN, ED
TROPONIN I, POC: 0 ng/mL (ref 0.00–0.08)
Troponin i, poc: 0 ng/mL (ref 0.00–0.08)

## 2017-11-23 LAB — BASIC METABOLIC PANEL
ANION GAP: 10 (ref 5–15)
BUN: 14 mg/dL (ref 6–20)
CALCIUM: 9.9 mg/dL (ref 8.9–10.3)
CO2: 25 mmol/L (ref 22–32)
Chloride: 104 mmol/L (ref 98–111)
Creatinine, Ser: 0.95 mg/dL (ref 0.61–1.24)
GFR calc Af Amer: >60 mL/min (ref >60)
GFR calc non Af Amer: >60 mL/min (ref >60)
GLUCOSE: 92 mg/dL (ref 70–99)
Potassium: 4 mmol/L (ref 3.5–5.1)
Sodium: 139 mmol/L (ref 135–145)

## 2017-11-23 LAB — RAPID URINE DRUG SCREEN, HOSP PERFORMED
Amphetamines: NOT DETECTED
BENZODIAZEPINES: NOT DETECTED
Barbiturates: NOT DETECTED
Cocaine: NOT DETECTED
OPIATES: NOT DETECTED
Tetrahydrocannabinol: NOT DETECTED

## 2017-11-23 LAB — D-DIMER, QUANTITATIVE: D-Dimer, Quant: <0.27 ug/mL-FEU (ref 0.00–0.50)

## 2017-11-23 MED ORDER — SODIUM CHLORIDE 0.9 % IV SOLN
INTRAVENOUS | Status: DC
  Administered 2017-11-23: 100 mL/h via INTRAVENOUS

## 2017-11-23 MED ORDER — SODIUM CHLORIDE 0.9 % IV BOLUS
1000.0000 mL | Freq: Once | INTRAVENOUS | Status: AC
  Administered 2017-11-23: 1000 mL via INTRAVENOUS

## 2017-11-23 NOTE — ED Notes (Addendum)
Pt state he had no  difficuylty walking

## 2017-11-23 NOTE — ED Notes (Addendum)
Irregular rhythm noted and recording given to Dr. Deretha EmoryZackowski. Bigemeny noted.

## 2017-11-23 NOTE — ED Notes (Signed)
Pt reports chest pains that have been going on for a month now. Pt reports these pains feel like a squeezing at times and at other times like someone is stabbing him with a pin. Pt denies any illicit street drugs.

## 2017-11-23 NOTE — Discharge Instructions (Addendum)
Work note provided.  Call and schedule appointment with cardiology.  In the meantime recommend taking a baby aspirin once daily.  Return for any new or worse symptoms.  Today's work-up without any significant findings.  Her cardiology will be able to do additional work-up as needed.

## 2017-11-23 NOTE — ED Notes (Signed)
Pt ambulating in hallway with tech

## 2017-11-23 NOTE — ED Notes (Signed)
Pt states he understands instructions and after discussed at length is discharged stable with steadygait. Urged to return for any problems.

## 2017-11-23 NOTE — ED Provider Notes (Signed)
[facility] Provider Note   CSN: [id number] Arrival date & time: 11/23/17  [id number]     History   Chief Complaint Chief Complaint  Patient presents with  . Chest Pain    HPI [name] is a 22 y.o. male.  Patient states that he has been having difficulty with anterior chest pain for several months following a significant viral illness over the winter early spring.  Patient is also had fatigue.  Has some shortness of breath with it.  He just has not felt right ever since he had that viral illness.  Is been seen several times at urgent care for this complaint.  Patient denies any drug use.  Used to use cocaine but that has been over a year since he is used that.  The pain occurs on a daily basis it is intermittent but sometimes it is severe.     Past Medical History:  Diagnosis Date  . Acid reflux     There are no active problems to display for this patient.   Past Surgical History:  Procedure Laterality Date  . ESOPHAGUS SURGERY          Home Medications    Prior to Admission medications   Not on File    Family History No family history on file.  Social History Social History   Tobacco Use  . Smoking status: Former Smoker    Packs/day: 0.50    Types: Cigarettes  Substance Use Topics  . Alcohol use: Yes    Alcohol/week: 0.0 standard drinks    Comment: occasional   . Drug use: Not Currently    Comment: previously used cocaine in 2016     Allergies   Patient has no known allergies.   Review of Systems Review of Systems  Constitutional: Positive for fatigue. Negative for fever.  HENT: Negative for congestion.   Eyes: Negative for visual disturbance.  Respiratory: Positive for shortness of breath.   Cardiovascular: Positive for chest pain.  Gastrointestinal: Negative for abdominal pain.  Genitourinary: Negative for dysuria.  Musculoskeletal: Negative for back pain.  Skin: Negative for rash.  Neurological:  Negative for headaches.  Hematological: Does not bruise/bleed easily.  Psychiatric/Behavioral: Negative for confusion.     Physical Exam Updated Vital Signs BP 116/70   Pulse 61   Temp 97.9 F (36.6 C) (Oral)   Resp 19   SpO2 97%   Physical Exam  Constitutional: He is oriented to person, place, and time. He appears well-developed and well-nourished. No distress.  HENT:  Head: Normocephalic and atraumatic.  Mouth/Throat: Oropharynx is clear and moist.  Eyes: Pupils are equal, round, and reactive to light. EOM are normal.  Neck: Normal range of motion. Neck supple.  Cardiovascular: Normal rate, regular rhythm and normal heart sounds.  Pulmonary/Chest: Effort normal and breath sounds normal. No respiratory distress. He has no wheezes.  Abdominal: Soft. Bowel sounds are normal. There is no tenderness.  Musculoskeletal: Normal range of motion.  Neurological: He is alert and oriented to person, place, and time. No cranial nerve deficit or sensory deficit. He exhibits normal muscle tone. Coordination normal.  Skin: Skin is warm.  Nursing note and vitals reviewed.    ED Treatments / Results  Labs (all labs ordered are listed, but only abnormal results are displayed) Labs Reviewed  URINALYSIS, ROUTINE W REFLEX MICROSCOPIC - Abnormal; Notable for the following components:      Result Value   Ketones, ur 5 (*)  All other components within normal limits  BASIC METABOLIC PANEL  CBC  D-DIMER, QUANTITATIVE (NOT [facility])  RAPID URINE DRUG SCREEN, HOSP PERFORMED  I-STAT TROPONIN, ED  I-STAT TROPONIN, ED    EKG EKG Interpretation  Date/Time:  Thursday November 23 2017 06:25:47 EDT Ventricular Rate:  86 PR Interval:  132 QRS Duration: 94 QT Interval:  372 QTC Calculation: 445 R Axis:   107 Text Interpretation:  Sinus rhythm with occasional Premature ventricular complexes Right atrial enlargement Rightward axis Pulmonary disease pattern Incomplete right bundle branch block  Abnormal ECG Interpretation limited secondary to artifact No significant change since last tracing Confirmed by Zadie RhineWickline, Donald ([id number]) on 11/23/2017 6:38:00 AM   Radiology Dg Chest 2 View  Result Date: 11/23/2017 CLINICAL DATA:  Central chest pain for 4 months. Pain is worse with deep breaths. EXAM: CHEST - 2 VIEW COMPARISON:  Two-view chest x-ray 12/18/2014 FINDINGS: The heart size and mediastinal contours are within normal limits. Both lungs are clear. The visualized skeletal structures are unremarkable. IMPRESSION: Negative two view chest x-ray Electronically Signed   By: Marin Robertshristopher  Mattern M.D.   On: 11/23/2017 07:38    Procedures Procedures (including critical care time)  Medications Ordered in ED Medications  0.9 %  sodium chloride infusion (100 mL/hr Intravenous New Bag/Given 11/23/17 1319)  sodium chloride 0.9 % bolus 1,000 mL (0 mLs Intravenous Stopped 11/23/17 1311)     Initial Impression / Assessment and Plan / ED Course  I have reviewed the triage vital signs and the nursing notes.  Pertinent labs & imaging results that were available during my care of the patient were reviewed by me and considered in my medical decision making (see chart for details).     Extensive work-up here for several month history of the chest pain.  Troponins negative x2 d-dimer negative.  Patient with some bigeminy on cardiac monitoring.  Patient's blood pressures a time was soft but after walking him and checking them they were fine.  Patient had a viral illness in the early spring winter months and had persistent chest discomfort since then.  Possibly could have had a pericarditis but was expected his troponins to be positive her EKG does show some changes consistent with that.  But based on this recommending he follow-up with cardiology safe to go home today.  Chest x-ray also negative.  Urine drug screen negative.  So recommending baby aspirin a day.  And follow-up with cardiology.  Final  Clinical Impressions(s) / ED Diagnoses   Final diagnoses:  Precordial pain    ED Discharge Orders    None       Vanetta MuldersZackowski, Javante Nilsson, MD 11/23/17 [phone]

## 2017-11-23 NOTE — ED Triage Notes (Signed)
Pt reports central chest pain x4 months, states pain is worse with deep breath, states he has been seen for this before and given steroids for a virus. Patient is concerned something is wrong with his heart. Reports he previously used cocaine in 2016.

## 2017-12-27 ENCOUNTER — Ambulatory Visit (INDEPENDENT_AMBULATORY_CARE_PROVIDER_SITE_OTHER): Payer: Self-pay | Admitting: Cardiovascular Disease

## 2017-12-27 ENCOUNTER — Encounter: Payer: Self-pay | Admitting: Cardiovascular Disease

## 2017-12-27 DIAGNOSIS — R0789 Other chest pain: Secondary | ICD-10-CM

## 2017-12-27 NOTE — Progress Notes (Signed)
12/27/2017 [name]   1995-06-23  [id number]  Primary Physician Patient, No Pcp Per Primary Cardiologist: Runell Gess MD Milagros Loll, [place], [place]  HPI:  [name] is a 22 y.o. thin appearing single Caucasian male with no children with multiple tattoos referred by the ER for atypical chest pain.  He currently works doing Aeronautical engineer.  He has used cocaine in the past.  He was evaluated 3 years ago by Dr. Sherlie Ban for atypical chest pain as well found to be musculoskeletal.  He was recently evaluated in the emergency room 11/23/2017 with a negative work-up including enzymes and EKGs.  He says he has occasional shortness of breath and some atypical/noncardiac chest pain.   No outpatient medications have been marked as taking for the 12/27/17 encounter (Office Visit) with Runell Gess, MD.     No Known Allergies  Social History   Socioeconomic History  . Marital status: Married    Spouse name: Not on file  . Number of children: Not on file  . Years of education: Not on file  . Highest education level: Not on file  Occupational History  . Not on file  Social Needs  . Financial resource strain: Not on file  . Food insecurity:    Worry: Not on file    Inability: Not on file  . Transportation needs:    Medical: Not on file    Non-medical: Not on file  Tobacco Use  . Smoking status: Former Smoker    Packs/day: 0.50    Types: Cigarettes  . Smokeless tobacco: Never Used  Substance and Sexual Activity  . Alcohol use: Yes    Alcohol/week: 0.0 standard drinks    Comment: occasional   . Drug use: Not Currently    Comment: previously used cocaine in 2016  . Sexual activity: Yes  Lifestyle  . Physical activity:    Days per week: Not on file    Minutes per session: Not on file  . Stress: Not on file  Relationships  . Social connections:    Talks on phone: Not on file    Gets together: Not on file    Attends religious service: Not on file    Active member of club  or organization: Not on file    Attends meetings of clubs or organizations: Not on file    Relationship status: Not on file  . Intimate partner violence:    Fear of current or ex partner: Not on file    Emotionally abused: Not on file    Physically abused: Not on file    Forced sexual activity: Not on file  Other Topics Concern  . Not on file  Social History Narrative  . Not on file     Review of Systems: General: negative for chills, fever, night sweats or weight changes.  Cardiovascular: negative for chest pain, dyspnea on exertion, edema, orthopnea, palpitations, paroxysmal nocturnal dyspnea or shortness of breath Dermatological: negative for rash Respiratory: negative for cough or wheezing Urologic: negative for hematuria Abdominal: negative for nausea, vomiting, diarrhea, bright red blood per rectum, melena, or hematemesis Neurologic: negative for visual changes, syncope, or dizziness All other systems reviewed and are otherwise negative except as noted above.    Blood pressure 102/82, pulse 71, height 5\' 9"  (1.753 m), weight 140 lb 6.4 oz (63.7 kg).  General appearance: alert and no distress Neck: no adenopathy, no carotid bruit, no JVD, supple, symmetrical, trachea midline and thyroid not  enlarged, symmetric, no tenderness/mass/nodules Lungs: clear to auscultation bilaterally Heart: regular rate and rhythm, S1, S2 normal, no murmur, click, rub or gallop Extremities: extremities normal, atraumatic, no cyanosis or edema Pulses: 2+ and symmetric Skin: Skin color, texture, turgor normal. No rashes or lesions Neurologic: Alert and oriented X 3, normal strength and tone. Normal symmetric reflexes. Normal coordination and gait  EKG not performed today  ASSESSMENT AND PLAN:   Atypical chest pain [name] was referred by the emergency room after being seen on 11/23/2017 for atypical chest pain.  His enzymes were negative.  EKG showed no acute changes.  There was discussion about  possible pericarditis.  He has used cocaine in the past.  I am going to get a 2D echo to further evaluate.      Runell GessJonathan J. Lawernce Earll MD FACP,FACC,FAHA, [facility] 12/27/2017 4:29 PM

## 2017-12-27 NOTE — Assessment & Plan Note (Signed)
Barbara CowerJason was referred by the emergency room after being seen on 11/23/2017 for atypical chest pain.  His enzymes were negative.  EKG showed no acute changes.  There was discussion about possible pericarditis.  He has used cocaine in the past.  I am going to get a 2D echo to further evaluate.

## 2017-12-27 NOTE — Patient Instructions (Signed)
Medication Instructions:  Your physician recommends that you continue on your current medications as directed. Please refer to the Current Medication list given to you today.   Labwork: none  Testing/Procedures: Your physician has requested that you have an echocardiogram. Echocardiography is a painless test that uses sound waves to create images of your heart. It provides your doctor with information about the size and shape of your heart and how well your heart's chambers and valves are working. This procedure takes approximately one hour. There are no restrictions for this procedure.    Follow-Up: Follow up with Dr. Berry as needed.   Any Other Special Instructions Will Be Listed Below (If Applicable).     If you need a refill on your cardiac medications before your next appointment, please call your pharmacy.   

## 2017-12-29 ENCOUNTER — Other Ambulatory Visit (HOSPITAL_COMMUNITY): Payer: Self-pay

## 2017-12-29 DIAGNOSIS — R0989 Other specified symptoms and signs involving the circulatory and respiratory systems: Secondary | ICD-10-CM

## 2018-01-04 ENCOUNTER — Other Ambulatory Visit (HOSPITAL_COMMUNITY): Payer: Self-pay

## 2018-01-08 ENCOUNTER — Ambulatory Visit (HOSPITAL_COMMUNITY)
Admission: EM | Admit: 2018-01-08 | Discharge: 2018-01-08 | Disposition: A | Discharge status: Home / Self Care | Payer: Self-pay | Attending: Family Medicine | Admitting: Family Medicine

## 2018-01-08 ENCOUNTER — Encounter (HOSPITAL_COMMUNITY): Payer: Self-pay | Admitting: Emergency Medicine

## 2018-01-08 DIAGNOSIS — R531 Weakness: Secondary | ICD-10-CM | POA: Insufficient documentation

## 2018-01-08 DIAGNOSIS — J45909 Unspecified asthma, uncomplicated: Secondary | ICD-10-CM | POA: Insufficient documentation

## 2018-01-08 DIAGNOSIS — R0789 Other chest pain: Secondary | ICD-10-CM | POA: Insufficient documentation

## 2018-01-08 DIAGNOSIS — K219 Gastro-esophageal reflux disease without esophagitis: Secondary | ICD-10-CM | POA: Insufficient documentation

## 2018-01-08 DIAGNOSIS — Z87891 Personal history of nicotine dependence: Secondary | ICD-10-CM | POA: Insufficient documentation

## 2018-01-08 LAB — TSH: TSH: 2.361 u[IU]/mL (ref 0.350–4.500)

## 2018-01-08 LAB — SEDIMENTATION RATE: Sed Rate: 1 mm/hr (ref 0–16)

## 2018-01-08 MED ORDER — FLUTICASONE-SALMETEROL 100-50 MCG/DOSE IN AEPB: 1.0000 | INHALATION_SPRAY | Freq: Once | RESPIRATORY_TRACT | 1 refills | Status: DC

## 2018-01-08 NOTE — ED Provider Notes (Signed)
[facility]    CSN: [id number] Arrival date & time: 01/08/18  1627     History   Chief Complaint Chief Complaint  Patient presents with  . Weakness    HPI [name] is a 22 y.o. male.   1 of several recent visits with complaints of weakness shortness of breath weight loss.  Multiple labs including CBC chest x-ray BMP have all been normal.  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  Also had asthma but has not had any inhalers.  He subjectively feels short of breath and says he has had some cough.  Denies use of vaping cigarettes.  HPI  Past Medical History:  Diagnosis Date  . Acid reflux     Patient Active Problem List   Diagnosis Date Noted  . Atypical chest pain 12/27/2017    Past Surgical History:  Procedure Laterality Date  . ESOPHAGUS SURGERY         Home Medications    Prior to Admission medications   Medication Sig Start Date End Date Taking? Authorizing Provider  Fluticasone-Salmeterol (ADVAIR DISKUS) 100-50 MCG/DOSE AEPB Inhale 1 puff into the lungs once for 1 dose. 01/08/18 01/08/18  Frederica Kuster, MD    Family History No family history on file.  Social History Social History   Tobacco Use  . Smoking status: Former Smoker    Packs/day: 0.50    Types: Cigarettes  . Smokeless tobacco: Never Used  Substance Use Topics  . Alcohol use: Yes    Alcohol/week: 0.0 standard drinks    Comment: occasional   . Drug use: Not Currently    Comment: previously used cocaine in 2016     Allergies   Patient has no known allergies.   Review of Systems Review of Systems  Constitutional: Positive for fatigue.  HENT: Negative.   Respiratory: Positive for cough and shortness of breath.   Cardiovascular: Positive for palpitations.  Gastrointestinal: Negative.   Neurological: Negative.      Physical Exam Triage Vital Signs ED Triage Vitals  Enc Vitals Group     BP 01/08/18 1746 123/82      Pulse Rate 01/08/18 1746 83     Resp 01/08/18 1746 16     Temp 01/08/18 1746 98.4 F (36.9 C)     Temp Source 01/08/18 1746 Oral     SpO2 01/08/18 1746 100 %     Weight --      Height --      Head Circumference --      Peak Flow --      Pain Score 01/08/18 1747 4     Pain Loc --      Pain Edu? --      Excl. in GC? --    No data found.  Updated Vital Signs BP 123/82   Pulse 83   Temp 98.4 F (36.9 C) (Oral)   Resp 16   SpO2 100%   Visual Acuity Right Eye Distance:   Left Eye Distance:   Bilateral Distance:    Right Eye Near:   Left Eye Near:    Bilateral Near:     Physical Exam  Constitutional: He is oriented to person, place, and time. He appears well-developed and well-nourished.  HENT:  Head: Normocephalic.  Mouth/Throat: Oropharynx is clear and moist.  Eyes: Pupils are equal, round, and reactive to light.  Cardiovascular: Normal rate.  Occasional extrasystole.  Question split second  sound.  Pulmonary/Chest: Effort normal and breath sounds normal.  Abdominal: There is no tenderness. There is no guarding.  Musculoskeletal: Normal range of motion.  Neurological: He is oriented to person, place, and time.     UC Treatments / Results  Labs (all labs ordered are listed, but only abnormal results are displayed) Labs Reviewed  ANTINUCLEAR ANTIBODIES, IFA  SEDIMENTATION RATE  TSH    EKG None  Radiology No results found.  Procedures Procedures (including critical care time)  Medications Ordered in UC Medications - No data to display  Initial Impression / Assessment and Plan / UC Course  I have reviewed the triage vital signs and the nursing notes.  Pertinent labs & imaging results that were available during my care of the patient were reviewed by me and considered in my medical decision making (see chart for details).     Short of breath and weak.  Symptoms are very subjective as there have been are not denied any objective findings.  Also  has weight loss of 15 pounds over 6 months by history. Final Clinical Impressions(s) / UC Diagnoses   Final diagnoses:  None   Discharge Instructions   None    ED Prescriptions    Medication Sig Dispense Auth. Provider   Fluticasone-Salmeterol (ADVAIR DISKUS) 100-50 MCG/DOSE AEPB Inhale 1 puff into the lungs once for 1 dose. 60 each Frederica Kuster, MD     Controlled Substance Prescriptions [place] Controlled Substance Registry consulted? No   Frederica Kuster, MD 01/08/18 [phone]

## 2018-01-08 NOTE — ED Triage Notes (Signed)
PT reports he is pale, SOB, has lost 15 lbs in last 6-8 months.   PT is concerned about breathing problems, viruses, pneumonia.

## 2018-01-10 LAB — ANTINUCLEAR ANTIBODIES, IFA: ANA Ab, IFA: NEGATIVE

## 2018-02-05 ENCOUNTER — Other Ambulatory Visit: Payer: Self-pay

## 2018-02-05 ENCOUNTER — Ambulatory Visit (HOSPITAL_COMMUNITY): Payer: Self-pay | Attending: Cardiology

## 2018-02-05 DIAGNOSIS — R0789 Other chest pain: Secondary | ICD-10-CM | POA: Insufficient documentation

## 2018-02-06 ENCOUNTER — Other Ambulatory Visit: Payer: Self-pay | Admitting: *Deleted

## 2018-02-06 ENCOUNTER — Encounter: Payer: Self-pay | Admitting: *Deleted

## 2018-02-06 DIAGNOSIS — I429 Cardiomyopathy, unspecified: Secondary | ICD-10-CM

## 2018-02-06 NOTE — Telephone Encounter (Signed)
This encounter was created in error - please disregard.

## 2018-02-21 ENCOUNTER — Ambulatory Visit (HOSPITAL_COMMUNITY): Admission: RE | Admit: 2018-02-21 | Payer: Self-pay | Source: Ambulatory Visit

## 2018-02-21 ENCOUNTER — Other Ambulatory Visit: Payer: Self-pay | Admitting: Cardiovascular Disease

## 2018-02-21 ENCOUNTER — Ambulatory Visit (HOSPITAL_COMMUNITY)
Admission: RE | Admit: 2018-02-21 | Discharge: 2018-02-21 | Disposition: A | Discharge status: Home / Self Care | Payer: Self-pay | Source: Ambulatory Visit | Attending: Cardiovascular Disease | Admitting: Cardiovascular Disease

## 2018-02-21 ENCOUNTER — Encounter (HOSPITAL_COMMUNITY): Payer: Self-pay

## 2018-02-21 DIAGNOSIS — I429 Cardiomyopathy, unspecified: Secondary | ICD-10-CM | POA: Insufficient documentation

## 2018-02-21 MED ORDER — METOPROLOL TARTRATE 5 MG/5ML IV SOLN
INTRAVENOUS | Status: AC
  Filled 2018-02-21: qty 10

## 2018-02-21 MED ORDER — IOPAMIDOL (ISOVUE-370) INJECTION 76%
100.0000 mL | Freq: Once | INTRAVENOUS | Status: AC | PRN
  Administered 2018-02-21: 100 mL via INTRAVENOUS

## 2018-02-21 MED ORDER — NITROGLYCERIN 0.4 MG SL SUBL
SUBLINGUAL_TABLET | SUBLINGUAL | Status: AC
  Administered 2018-02-21: 0.8 mg
  Filled 2018-02-21: qty 2

## 2018-02-21 NOTE — Progress Notes (Signed)
Patient arrived to radiology department for CT scan of heart. Patient heart rate 50-70s with occasional PVCs. Patient appear anxious and states he is worried about having "endocarditis or myocarditis because he was around coworkers that were sick". Patient taken to CT scanner and patient heart rate 50-70's with frequent PVCs. CT scanner unable to track heart rate consistently because of frequent PVC's. Dr. Eden EmmsNishan informed of situation. Patient informed that we will not be able to proceed with CT scan and states understanding.

## 2018-02-25 ENCOUNTER — Telehealth: Payer: Self-pay | Admitting: Physician Assistant

## 2018-02-25 NOTE — Telephone Encounter (Signed)
Patient paged the after hour answering to see what the next step of plan. 22 year old patient who had a viral infection earlier this year. Echo obtain in late oct 2019 showed EF 40-45%. Coronary CT obtained however had to be terminated early since his HR was fluctuating all over the place. Patient called after answering service to figure what the next step of the plan is. Will forward to Dr. Allyson SabalBerry to review. Note, he is not on any CHF meds.

## 2018-02-26 NOTE — Telephone Encounter (Signed)
Left message for pt to call to schedule follow up appt.

## 2018-02-26 NOTE — Telephone Encounter (Signed)
Needs return office visit to discuss results of 2D echo

## 2018-02-28 NOTE — Telephone Encounter (Signed)
Follow up scheduled

## 2018-03-01 ENCOUNTER — Telehealth: Payer: Self-pay | Admitting: *Deleted

## 2018-03-01 DIAGNOSIS — R0789 Other chest pain: Secondary | ICD-10-CM

## 2018-03-01 NOTE — Telephone Encounter (Signed)
-----   Message from Runell GessJonathan J Berry, MD sent at 02/26/2018  2:55 PM EST ----- OK, Thx Nish.  Gavin Poundeborah, can you order a Myoview stress test please  JJB ----- Message ----- From: Wendall StadeNishan, Peter C, MD Sent: 02/21/2018   4:05 PM EST To: Runell GessJonathan J Berry, MD  JB- unable to do cardiac CT Too many PVC;s BP was too low to give more than 5 mg lopressor and  Nurses wouldn't let me give versed in Radiology department Will need other test to r/o CAD He was Very anxious

## 2018-03-01 NOTE — Telephone Encounter (Signed)
Left message for pt to call and schedule exercise nuclear study. Order placed

## 2018-03-12 NOTE — Telephone Encounter (Signed)
Stress test has been scheduled.  

## 2018-03-29 ENCOUNTER — Telehealth (HOSPITAL_COMMUNITY): Payer: Self-pay

## 2018-03-29 NOTE — Telephone Encounter (Signed)
Encounter complete. 

## 2018-03-30 ENCOUNTER — Telehealth (HOSPITAL_COMMUNITY): Payer: Self-pay

## 2018-03-30 ENCOUNTER — Encounter

## 2018-03-30 ENCOUNTER — Encounter: Payer: Self-pay | Admitting: Cardiovascular Disease

## 2018-03-30 ENCOUNTER — Ambulatory Visit: Payer: BLUE CROSS/BLUE SHIELD | Admitting: Cardiovascular Disease

## 2018-03-30 DIAGNOSIS — R0989 Other specified symptoms and signs involving the circulatory and respiratory systems: Secondary | ICD-10-CM

## 2018-03-30 NOTE — Telephone Encounter (Signed)
Encounter complete. 

## 2018-04-03 ENCOUNTER — Ambulatory Visit (HOSPITAL_COMMUNITY): Admission: RE | Admit: 2018-04-03 | Payer: BLUE CROSS/BLUE SHIELD | Source: Ambulatory Visit

## 2018-04-05 ENCOUNTER — Inpatient Hospital Stay (HOSPITAL_COMMUNITY): Admission: RE | Admit: 2018-04-05 | Payer: Self-pay | Source: Ambulatory Visit

## 2018-04-05 ENCOUNTER — Ambulatory Visit: Payer: BLUE CROSS/BLUE SHIELD | Admitting: Cardiovascular Disease

## 2018-04-05 DIAGNOSIS — R0989 Other specified symptoms and signs involving the circulatory and respiratory systems: Secondary | ICD-10-CM

## 2018-04-06 ENCOUNTER — Encounter: Payer: Self-pay | Admitting: *Deleted

## 2018-05-19 ENCOUNTER — Other Ambulatory Visit: Payer: Self-pay

## 2018-05-19 ENCOUNTER — Encounter (HOSPITAL_COMMUNITY): Payer: Self-pay | Admitting: Emergency Medicine

## 2018-05-19 ENCOUNTER — Emergency Department (HOSPITAL_COMMUNITY): Payer: BLUE CROSS/BLUE SHIELD

## 2018-05-19 ENCOUNTER — Emergency Department (HOSPITAL_COMMUNITY)
Admission: EM | Admit: 2018-05-19 | Discharge: 2018-05-19 | Disposition: A | Discharge status: Home / Self Care | Payer: BLUE CROSS/BLUE SHIELD | Attending: Emergency Medicine | Admitting: Emergency Medicine

## 2018-05-19 DIAGNOSIS — R079 Chest pain, unspecified: Secondary | ICD-10-CM | POA: Diagnosis not present

## 2018-05-19 DIAGNOSIS — I493 Ventricular premature depolarization: Secondary | ICD-10-CM | POA: Insufficient documentation

## 2018-05-19 DIAGNOSIS — I429 Cardiomyopathy, unspecified: Secondary | ICD-10-CM | POA: Insufficient documentation

## 2018-05-19 DIAGNOSIS — R0789 Other chest pain: Secondary | ICD-10-CM | POA: Diagnosis present

## 2018-05-19 DIAGNOSIS — Z87891 Personal history of nicotine dependence: Secondary | ICD-10-CM | POA: Diagnosis not present

## 2018-05-19 LAB — CBC
HCT: 47.6 % (ref 39.0–52.0)
HEMOGLOBIN: 16.2 g/dL (ref 13.0–17.0)
MCH: 30.2 pg (ref 26.0–34.0)
MCHC: 34 g/dL (ref 30.0–36.0)
MCV: 88.6 fL (ref 80.0–100.0)
Platelets: 219 10*3/uL (ref 150–400)
RBC: 5.37 MIL/uL (ref 4.22–5.81)
RDW: 12.1 % (ref 11.5–15.5)
WBC: 4.9 10*3/uL (ref 4.0–10.5)
nRBC: 0 % (ref 0.0–0.2)

## 2018-05-19 LAB — RAPID URINE DRUG SCREEN, HOSP PERFORMED
Amphetamines: NOT DETECTED
Barbiturates: NOT DETECTED
Benzodiazepines: NOT DETECTED
Cocaine: NOT DETECTED
OPIATES: NOT DETECTED
Tetrahydrocannabinol: NOT DETECTED

## 2018-05-19 LAB — URINALYSIS, ROUTINE W REFLEX MICROSCOPIC
Bilirubin Urine: NEGATIVE
GLUCOSE, UA: NEGATIVE mg/dL
Hgb urine dipstick: NEGATIVE
Ketones, ur: NEGATIVE mg/dL
Leukocytes, UA: NEGATIVE
Nitrite: NEGATIVE
PH: 7 (ref 5.0–8.0)
Protein, ur: NEGATIVE mg/dL
Specific Gravity, Urine: 1.016 (ref 1.005–1.030)

## 2018-05-19 LAB — COMPREHENSIVE METABOLIC PANEL
ALBUMIN: 4.5 g/dL (ref 3.5–5.0)
ALT: 18 U/L (ref 0–44)
AST: 23 U/L (ref 15–41)
Alkaline Phosphatase: 44 U/L (ref 38–126)
Anion gap: 12 (ref 5–15)
BUN: 12 mg/dL (ref 6–20)
CO2: 26 mmol/L (ref 22–32)
Calcium: 10.2 mg/dL (ref 8.9–10.3)
Chloride: 102 mmol/L (ref 98–111)
Creatinine, Ser: 0.89 mg/dL (ref 0.61–1.24)
GFR calc Af Amer: >60 mL/min (ref >60)
GFR calc non Af Amer: >60 mL/min (ref >60)
Glucose, Bld: 115 mg/dL — ABNORMAL HIGH (ref 70–99)
POTASSIUM: 4.1 mmol/L (ref 3.5–5.1)
SODIUM: 140 mmol/L (ref 135–145)
TOTAL PROTEIN: 7.3 g/dL (ref 6.5–8.1)
Total Bilirubin: 1.3 mg/dL — ABNORMAL HIGH (ref 0.3–1.2)

## 2018-05-19 LAB — I-STAT TROPONIN, ED: Troponin i, poc: 0.01 ng/mL (ref 0.00–0.08)

## 2018-05-19 MED ORDER — SODIUM CHLORIDE 0.9 % IV BOLUS
1000.0000 mL | Freq: Once | INTRAVENOUS | Status: AC
  Administered 2018-05-19: 1000 mL via INTRAVENOUS

## 2018-05-19 NOTE — Discharge Instructions (Signed)
You also need to establish care with a PCP.

## 2018-05-19 NOTE — ED Provider Notes (Signed)
[facility] EMERGENCY DEPARTMENT Provider Note   CSN: [id number] Arrival date & time: 05/19/18  1100     History   Chief Complaint Chief Complaint  Patient presents with  . Chest Pain    HPI [name] is a 23 y.o. male.  Pt presents to the ED today with CP.  He has CP and weakness for several months.  In October, he saw Dr. Allyson Sabal (cards) who ordered an ECHO.  The ECHO showed hypokinesis of the inferior and inferiolateral walls with mild to moderate LV dysfunction.  EF 40-45%.  Next, a cardiac CT was ordered, but this was unable to be completed due to too many PVCs.  His bp was too low to give more than 5 mg of lopressor.  He was also very anxious during that test.  Pt was supposed to return for a nuclear medicine myocardial perfusion study; however, he was a no show.  He said no one was telling him what was going on and he was too nervous.  He continues to have sx of cp, so he came in today.  He said he's lost weight and feels week.  He is worried he is dying.     Past Medical History:  Diagnosis Date  . Acid reflux     Patient Active Problem List   Diagnosis Date Noted  . Atypical chest pain 12/27/2017    Past Surgical History:  Procedure Laterality Date  . ESOPHAGUS SURGERY          Home Medications    Prior to Admission medications   Not on File    Family History History reviewed. No pertinent family history.  Social History Social History   Tobacco Use  . Smoking status: Former Smoker    Packs/day: 0.50    Types: Cigarettes  . Smokeless tobacco: Never Used  Substance Use Topics  . Alcohol use: Yes    Alcohol/week: 0.0 standard drinks    Comment: occasional   . Drug use: Not Currently    Comment: previously used cocaine in 2016     Allergies   Patient has no known allergies.   Review of Systems Review of Systems  Cardiovascular: Positive for chest pain.  All other systems reviewed and are negative.    Physical  Exam Updated Vital Signs BP 95/71 (BP Location: Right Arm)   Pulse 79   Temp (!) 97.5 F (36.4 C) (Oral)   Resp 17   Ht 5\' 9"  (1.753 m)   Wt 65.8 kg   SpO2 100%   BMI 21.41 kg/m   Physical Exam Vitals signs and nursing note reviewed.  Constitutional:      Appearance: He is well-developed.  HENT:     Head: Normocephalic and atraumatic.  Eyes:     Pupils: Pupils are equal, round, and reactive to light.  Neck:     Musculoskeletal: Normal range of motion and neck supple.  Cardiovascular:     Rate and Rhythm: Normal rate. Rhythm irregular.     Heart sounds: Normal heart sounds.     Comments: PVCs on monitor Pulmonary:     Effort: Pulmonary effort is normal.     Breath sounds: Normal breath sounds.  Abdominal:     General: Bowel sounds are normal.     Palpations: Abdomen is soft.  Musculoskeletal: Normal range of motion.  Skin:    General: Skin is warm and dry.     Capillary Refill: Capillary refill takes less than 2  seconds.  Neurological:     General: No focal deficit present.     Mental Status: He is alert and oriented to person, place, and time.  Psychiatric:        Mood and Affect: Mood is anxious.      ED Treatments / Results  Labs (all labs ordered are listed, but only abnormal results are displayed) Labs Reviewed  COMPREHENSIVE METABOLIC PANEL - Abnormal; Notable for the following components:      Result Value   Glucose, Bld 115 (*)    Total Bilirubin 1.3 (*)    All other components within normal limits  URINALYSIS, ROUTINE W REFLEX MICROSCOPIC - Abnormal; Notable for the following components:   APPearance HAZY (*)    All other components within normal limits  CBC  RAPID URINE DRUG SCREEN, HOSP PERFORMED  HEPATITIS PANEL, ACUTE  TSH  I-STAT TROPONIN, ED    EKG EKG Interpretation  Date/Time:  Saturday May 19 2018 11:15:05 EST Ventricular Rate:  72 PR Interval:  130 QRS Duration: 102 QT Interval:  382 QTC Calculation: 418 R  Axis:   95 Text Interpretation:  Sinus rhythm with occasional Premature ventricular complexes Rightward axis Incomplete right bundle branch block Borderline ECG No significant change since last tracing Confirmed by Jacalyn Lefevre [phone]) on 05/19/2018 11:32:15 AM   Radiology Dg Chest 2 View  Result Date: 05/19/2018 CLINICAL DATA:  Chest pain EXAM: CHEST - 2 VIEW COMPARISON:  November 23, 2017 FINDINGS: There is no edema or consolidation. Heart size and pulmonary vascularity are within normal limits. No adenopathy. No bone lesions. No pneumothorax. IMPRESSION: No edema or consolidation. Electronically Signed   By: Bretta Bang III M.D.   On: 05/19/2018 12:39    Procedures Procedures (including critical care time)  Medications Ordered in ED Medications  sodium chloride 0.9 % bolus 1,000 mL (1,000 mLs Intravenous New Bag/Given 05/19/18 1140)     Initial Impression / Assessment and Plan / ED Course  I have reviewed the triage vital signs and the nursing notes.  Pertinent labs & imaging results that were available during my care of the patient were reviewed by me and considered in my medical decision making (see chart for details).   Pt is having frequent PVCs which he can feel.  I think that is the cause of his CP.  The pt denies any recent drug, tobacco or alcohol use.  He admits to using cocaine in the past, but that was when he was 18.  He does not drink much caffeine.   He is encouraged to f/u with cardiology again to get the tests done which need to be done.  He is also encouraged to establish primary care.  Return if worse.   Final Clinical Impressions(s) / ED Diagnoses   Final diagnoses:  PVC (premature ventricular contraction)  Cardiomyopathy, unspecified type [facility])    ED Discharge Orders    None       Jacalyn Lefevre, MD 05/19/18 1328

## 2018-05-19 NOTE — ED Notes (Signed)
Patient verbalizes understanding of discharge instructions. Opportunity for questioning and answers were provided. Armband removed by staff, pt discharged from ED.  

## 2018-05-19 NOTE — ED Triage Notes (Signed)
Pt comes in complaining of many heart problems to include central chest pain.  Pt asking for labs for hepatitis.  Pt does have history of and echo for he states was due so a virus that he had last April.  NAD noted at this time AOx4

## 2018-05-25 ENCOUNTER — Telehealth (HOSPITAL_COMMUNITY): Payer: Self-pay

## 2018-05-31 ENCOUNTER — Telehealth (HOSPITAL_COMMUNITY): Payer: Self-pay

## 2018-06-06 ENCOUNTER — Encounter: Payer: Self-pay | Admitting: Cardiovascular Disease

## 2018-06-06 ENCOUNTER — Ambulatory Visit (INDEPENDENT_AMBULATORY_CARE_PROVIDER_SITE_OTHER): Payer: BLUE CROSS/BLUE SHIELD | Admitting: Cardiovascular Disease

## 2018-06-06 DIAGNOSIS — R079 Chest pain, unspecified: Secondary | ICD-10-CM | POA: Diagnosis not present

## 2018-06-06 DIAGNOSIS — R0789 Other chest pain: Secondary | ICD-10-CM | POA: Diagnosis not present

## 2018-06-06 NOTE — Patient Instructions (Signed)
Medication Instructions:  Your physician recommends that you continue on your current medications as directed. Please refer to the Current Medication list given to you today.  If you need a refill on your cardiac medications before your next appointment, please call your pharmacy.   Lab work: NONE If you have labs (blood work) drawn today and your tests are completely normal, you will receive your results only by: Marland Kitchen MyChart Message (if you have MyChart) OR . A paper copy in the mail If you have any lab test that is abnormal or we need to change your treatment, we will call you to review the results.  Testing/Procedures: Your physician has requested that you have a lexiscan myoview. For further information please visit [email or web address] Please follow instruction sheet, as given.   Follow-Up: At [facility], you and your health needs are our priority.  As part of our continuing mission to provide you with exceptional heart care, we have created designated Provider Care Teams.  These Care Teams include your primary Cardiologist (physician) and Advanced Practice Providers (APPs -  Physician Assistants and Nurse Practitioners) who all work together to provide you with the care you need, when you need it. . You will need a follow up appointment in 4 weeks. You may see Dr. Allyson Sabal or one of the following Advanced Practice Providers on your designated Care Team:   . Corine Shelter, [place] . Azalee Course, PA-C . Micah Flesher, PA-C . Joni Reining, DNP . Theodore Demark, PA-C . Judy Pimple, PA-C . Marjie Skiff, PA-C

## 2018-06-06 NOTE — Assessment & Plan Note (Signed)
[name] continues to have atypical chest pain.  He was recently seen in the emergency room 05/19/2018 with ongoing chest pain.  His blood work was negative.  He did have frequent PVCs.  He did have a 2D echo performed 01/28/2018 that showed an EF of 40 to 45% with segmental wall motion abnormalities.  He is a former cocaine abuser.  I am going to get it at a pharmacologic Myoview stress test to further evaluate.

## 2018-06-06 NOTE — Progress Notes (Signed)
06/06/2018 [name]   1995-05-25  [id number]  Primary Physician Patient, No Pcp Per Primary Cardiologist: Runell Gess MD Milagros Loll, [place], [place]  HPI:  [name] is a 23 y.o.   thin appearing single Caucasian male with no children with multiple tattoos referred by the ER for atypical chest pain.  He currently works doing Aeronautical engineer, and also works in a Academic librarian.  I last saw him in the office 12/27/2017.  I did obtain a 2D echo on him 02/05/2018 revealing an EF of 40 to 45% with inferolateral and inferior hypokinesia. He has used cocaine in the past.  He was evaluated 3 years ago by Dr. Sherlie Ban for atypical chest pain as well found to be musculoskeletal.  He was recently evaluated in the emergency room 11/23/2017 with a negative work-up including enzymes and EKGs.  He says he has occasional shortness of breath and some atypical/noncardiac chest pain. He was seen in the ER again with chest pain.  I did order a nuclear stress test which he was a "no-show" for.  No outpatient medications have been marked as taking for the 06/06/18 encounter (Office Visit) with Runell Gess, MD.     No Known Allergies  Social History   Socioeconomic History  . Marital status: Single    Spouse name: Not on file  . Number of children: Not on file  . Years of education: Not on file  . Highest education level: Not on file  Occupational History  . Not on file  Social Needs  . Financial resource strain: Not on file  . Food insecurity:    Worry: Not on file    Inability: Not on file  . Transportation needs:    Medical: Not on file    Non-medical: Not on file  Tobacco Use  . Smoking status: Former Smoker    Packs/day: 0.50    Types: Cigarettes  . Smokeless tobacco: Never Used  Substance and Sexual Activity  . Alcohol use: Yes    Alcohol/week: 0.0 standard drinks    Comment: occasional   . Drug use: Not Currently    Comment: previously used cocaine in 2016  . Sexual  activity: Yes  Lifestyle  . Physical activity:    Days per week: Not on file    Minutes per session: Not on file  . Stress: Not on file  Relationships  . Social connections:    Talks on phone: Not on file    Gets together: Not on file    Attends religious service: Not on file    Active member of club or organization: Not on file    Attends meetings of clubs or organizations: Not on file    Relationship status: Not on file  . Intimate partner violence:    Fear of current or ex partner: Not on file    Emotionally abused: Not on file    Physically abused: Not on file    Forced sexual activity: Not on file  Other Topics Concern  . Not on file  Social History Narrative  . Not on file     Review of Systems: General: negative for chills, fever, night sweats or weight changes.  Cardiovascular: negative for chest pain, dyspnea on exertion, edema, orthopnea, palpitations, paroxysmal nocturnal dyspnea or shortness of breath Dermatological: negative for rash Respiratory: negative for cough or wheezing Urologic: negative for hematuria Abdominal: negative for nausea, vomiting, diarrhea, bright red blood per rectum, melena,  or hematemesis Neurologic: negative for visual changes, syncope, or dizziness All other systems reviewed and are otherwise negative except as noted above.    Blood pressure 123/67, pulse 95, height 5\' 9"  (1.753 m), weight 144 lb 3.2 oz (65.4 kg), SpO2 98 %.  General appearance: alert and no distress Neck: no adenopathy, no carotid bruit, no JVD, supple, symmetrical, trachea midline and thyroid not enlarged, symmetric, no tenderness/mass/nodules Lungs: clear to auscultation bilaterally Heart: regular rate and rhythm, S1, S2 normal, no murmur, click, rub or gallop Extremities: extremities normal, atraumatic, no cyanosis or edema Pulses: 2+ and symmetric Skin: Skin color, texture, turgor normal. No rashes or lesions Neurologic: Alert and oriented X 3, normal strength  and tone. Normal symmetric reflexes. Normal coordination and gait  EKG not performed today  ASSESSMENT AND PLAN:   Atypical chest pain Sollie continues to have atypical chest pain.  He was recently seen in the emergency room 05/19/2018 with ongoing chest pain.  His blood work was negative.  He did have frequent PVCs.  He did have a 2D echo performed 01/28/2018 that showed an EF of 40 to 45% with segmental wall motion abnormalities.  He is a former cocaine abuser.  I am going to get it at a pharmacologic Myoview stress test to further evaluate.      Runell Gess MD FACP,FACC,FAHA, [facility] 06/06/2018 4:49 PM

## 2018-06-06 NOTE — Addendum Note (Signed)
Addended by: Harlow Asa on: 06/06/2018 04:56 PM   Modules accepted: Orders

## 2018-06-12 ENCOUNTER — Ambulatory Visit (HOSPITAL_COMMUNITY)
Admission: RE | Admit: 2018-06-12 | Discharge: 2018-06-12 | Disposition: A | Discharge status: Home / Self Care | Payer: BLUE CROSS/BLUE SHIELD | Source: Ambulatory Visit | Attending: Cardiology | Admitting: Cardiology

## 2018-06-12 DIAGNOSIS — R079 Chest pain, unspecified: Secondary | ICD-10-CM | POA: Diagnosis not present

## 2018-06-12 LAB — MYOCARDIAL PERFUSION IMAGING
CHL CUP NUCLEAR SSS: 3
LV dias vol: 176 mL (ref 62–150)
LV sys vol: 97 mL
NUC STRESS TID: 1.13
Peak HR: 117 {beats}/min
Rest HR: 63 {beats}/min
SDS: 0
SRS: 3

## 2018-06-12 MED ORDER — TECHNETIUM TC 99M TETROFOSMIN IV KIT
10.7000 | PACK | Freq: Once | INTRAVENOUS | Status: AC | PRN
  Administered 2018-06-12: 10.7 via INTRAVENOUS
  Filled 2018-06-12: qty 11

## 2018-06-12 MED ORDER — REGADENOSON 0.4 MG/5ML IV SOLN
0.4000 mg | Freq: Once | INTRAVENOUS | Status: AC
  Administered 2018-06-12: 0.4 mg via INTRAVENOUS

## 2018-06-12 MED ORDER — TECHNETIUM TC 99M TETROFOSMIN IV KIT
31.7000 | PACK | Freq: Once | INTRAVENOUS | Status: AC | PRN
  Administered 2018-06-12: 31.7 via INTRAVENOUS
  Filled 2018-06-12: qty 32

## 2018-06-14 ENCOUNTER — Telehealth: Payer: Self-pay | Admitting: Cardiovascular Disease

## 2018-06-14 NOTE — Telephone Encounter (Signed)
° ° ° °  Please call patient with stress test results °

## 2018-06-14 NOTE — Telephone Encounter (Signed)
Follow up   Pt is returning phone call for results.  He said he is working and to please call back with the results around 4pm today

## 2018-06-14 NOTE — Telephone Encounter (Signed)
Called patient LVM, advised that the stress results per Dr.Berry to have patient be seen at next available appointment to discuss. Advised of opening on 06/29/2018 at 10:15 am. Advised patient to call back if he could not make that appointment.

## 2018-06-15 ENCOUNTER — Encounter (HOSPITAL_COMMUNITY): Payer: BLUE CROSS/BLUE SHIELD

## 2018-06-18 ENCOUNTER — Other Ambulatory Visit: Payer: Self-pay

## 2018-06-18 ENCOUNTER — Telehealth: Payer: Self-pay | Admitting: Cardiovascular Disease

## 2018-06-18 NOTE — Telephone Encounter (Signed)
° °  Patient calling to discuss results of stress test.

## 2018-06-18 NOTE — Telephone Encounter (Signed)
New Message        Patient is calling back for test results.

## 2018-06-18 NOTE — Telephone Encounter (Signed)
Patient aware of results and of appt scheduled for 3/20 to discuss myocardial perfusion imaging study results. Informed pt that Dr. Allyson Sabal will review results with pt during this OV. Pt verbalized understanding

## 2018-06-18 NOTE — Telephone Encounter (Signed)
Error no note needed °

## 2018-06-21 ENCOUNTER — Telehealth: Payer: Self-pay | Admitting: Cardiovascular Disease

## 2018-06-21 ENCOUNTER — Ambulatory Visit (INDEPENDENT_AMBULATORY_CARE_PROVIDER_SITE_OTHER): Payer: BLUE CROSS/BLUE SHIELD | Admitting: Cardiology

## 2018-06-21 ENCOUNTER — Encounter: Payer: Self-pay | Admitting: Cardiology

## 2018-06-21 VITALS — BP 107/64 | HR 56 | Ht 69.0 in | Wt 142.8 lb

## 2018-06-21 DIAGNOSIS — R531 Weakness: Secondary | ICD-10-CM | POA: Diagnosis not present

## 2018-06-21 DIAGNOSIS — F419 Anxiety disorder, unspecified: Secondary | ICD-10-CM

## 2018-06-21 DIAGNOSIS — I255 Ischemic cardiomyopathy: Secondary | ICD-10-CM

## 2018-06-21 DIAGNOSIS — I429 Cardiomyopathy, unspecified: Secondary | ICD-10-CM

## 2018-06-21 DIAGNOSIS — R9430 Abnormal result of cardiovascular function study, unspecified: Secondary | ICD-10-CM | POA: Insufficient documentation

## 2018-06-21 DIAGNOSIS — R634 Abnormal weight loss: Secondary | ICD-10-CM | POA: Diagnosis not present

## 2018-06-21 NOTE — Assessment & Plan Note (Signed)
Past EF 40-45% by echo

## 2018-06-21 NOTE — Assessment & Plan Note (Addendum)
Myoview abnormal- he was unable to complete coronary CT and his frequent PVCs also impacted his CT.  He may ultimately need diagnostic catheterization.  I would think he would require sedation for this

## 2018-06-21 NOTE — Telephone Encounter (Signed)
° °  System issues   Pt c/o of Chest Pain: STAT if CP now or developed within 24 hours  1. Are you having CP right now yes  2. Are you experiencing any other symptoms (ex. SOB, nausea, vomiting, sweating)?  3. How long have you been experiencing CP?  4. Is your CP continuous or coming and going?continuous  5. Have you taken Nitroglycerin? ?

## 2018-06-21 NOTE — Progress Notes (Signed)
06/21/2018 [name]   02-13-1996  [id number]  Primary Physician Patient, No Pcp Per Primary Cardiologist: Dr Allyson Sabal  HPI:  23 y/o Caucasian male seen by cardiology in the past for chest pain which was felt to be non cardiac.  He was seen in Aug 2019 in the ED.  Labs were normal.  Echo done 02/05/18 showed an EF of 40-45% with segmental WMA.  Coronary CT scan was done but the patient was too anxious and had frequent PVCs. They were unable to complete the study.  I believe it was re ordered but never completed.  He was seen in follow up 06/06/2018 and Dr Allyson Sabal ordered a Lexiscan which was done 06/12/2018.  This was an intermediate study with a small apical defect.  He was to f/u with Dr Allyson Sabal 06/29/2018 but called this morning and asked to be seen.  He apparently was already in the building because he walked into the office shortly after this.  When I saw him he was anxious, pressured speech, very frustrated, but coherent.  His main complaint is gradual decline in strength over the past year.  He says he has lost 15 lbs.  He complained of his heart racing but he was in NSR-56 with PVCs.  Unfortunately Epic was down and I did not have access to his Myoview report.  I reviewed his previous studies including his past lab work.  The patient understands what was checked in the past and why it was checked.  He knows that his TSH was normal and his ANA was normal.  He has an idea what that indicates.  I suggested we proceed with an echocardiogram to make sure his LV function has not declined, as noted I did not have his Myoview results when I ordered this.  I also said strongly suggested we try and get him lined up with a primary care provider, possibly [facility].   No current outpatient medications on file.   No current facility-administered medications for this visit.     No Known Allergies  Past Medical History:  Diagnosis Date  . Acid reflux     Social History   Socioeconomic  History  . Marital status: Single    Spouse name: Not on file  . Number of children: Not on file  . Years of education: Not on file  . Highest education level: Not on file  Occupational History  . Not on file  Social Needs  . Financial resource strain: Not on file  . Food insecurity:    Worry: Not on file    Inability: Not on file  . Transportation needs:    Medical: Not on file    Non-medical: Not on file  Tobacco Use  . Smoking status: Former Smoker    Packs/day: 0.50    Types: Cigarettes  . Smokeless tobacco: Never Used  Substance and Sexual Activity  . Alcohol use: Yes    Alcohol/week: 0.0 standard drinks    Comment: occasional   . Drug use: Not Currently    Comment: previously used cocaine in 2016  . Sexual activity: Yes  Lifestyle  . Physical activity:    Days per week: Not on file    Minutes per session: Not on file  . Stress: Not on file  Relationships  . Social connections:    Talks on phone: Not on file    Gets together: Not on file    Attends religious service: Not on file  Active member of club or organization: Not on file    Attends meetings of clubs or organizations: Not on file    Relationship status: Not on file  . Intimate partner violence:    Fear of current or ex partner: Not on file    Emotionally abused: Not on file    Physically abused: Not on file    Forced sexual activity: Not on file  Other Topics Concern  . Not on file  Social History Narrative  . Not on file     No family history on file.   Review of Systems: General: negative for chills, fever, night sweats or weight changes.  Cardiovascular: negative for chest pain, dyspnea on exertion, edema, orthopnea, palpitations, paroxysmal nocturnal dyspnea or shortness of breath Dermatological: negative for rash Respiratory: negative for cough or wheezing Urologic: negative for hematuria Abdominal: negative for nausea, vomiting, diarrhea, bright red blood per rectum, melena, or  hematemesis Neurologic: negative for visual changes, syncope, or dizziness All other systems reviewed and are otherwise negative except as noted above.    Blood pressure 107/64, pulse (!) 56, height 5\' 9"  (1.753 m), weight 142 lb 12.8 oz (64.8 kg).  General appearance: alert, cooperative, no distress and anxious, talkative Neck: no carotid bruit, no JVD and thyroid not enlarged, symmetric, no tenderness/mass/nodules Lungs: clear to auscultation bilaterally Heart: regular rate and rhythm Extremities: no edema Skin: Skin color, texture, turgor normal. No rashes or lesions Neurologic: Grossly normal  EKG NSR, SB, PVCs  ASSESSMENT AND PLAN:   Weight loss Pt reports 15 lb weight loss- TSH WNL  Weakness Unclear this a cardiac issue  Cardiomyopathy (HCC) Past EF 40-45% by echo  Abnormal cardiac function test Myoview abnormal- he was unable to complete coronary CT and his frequent PVCs also impacted his CT.  He may ultimately need diagnostic catheterization.  I would think he would require sedation for this  Anxiety Pt is significantly anxious about his health.  He is coherent and intelligent  But I suspect there is an underlying psychological issue.    PLAN  Proceed with echo.  I'll review further with Dr Allyson Sabal.  Corine Shelter PA-C 06/21/2018 2:35 PM

## 2018-06-21 NOTE — Assessment & Plan Note (Signed)
Pt reports 15 lb weight loss- TSH WNL

## 2018-06-21 NOTE — Patient Instructions (Addendum)
Medication Instructions:  Your physician recommends that you continue on your current medications as directed. Please refer to the Current Medication list given to you today. If you need a refill on your cardiac medications before your next appointment, please call your pharmacy.   Lab work: NONE  If you have labs (blood work) drawn today and your tests are completely normal, you will receive your results only by: Marland Kitchen MyChart Message (if you have MyChart) OR . A paper copy in the mail If you have any lab test that is abnormal or we need to change your treatment, we will call you to review the results.  Testing/Procedures: Your physician has requested that you have an echocardiogram. Echocardiography is a painless test that uses sound waves to create images of your heart. It provides your doctor with information about the size and shape of your heart and how well your heart's chambers and valves are working. This procedure takes approximately one hour. There are no restrictions for this procedure. [address]  Follow-Up: At [facility], you and your health needs are our priority.  As part of our continuing mission to provide you with exceptional heart care, we have created designated Provider Care Teams.  These Care Teams include your primary Cardiologist (physician) and Advanced Practice Providers (APPs -  Physician Assistants and Nurse Practitioners) who all work together to provide you with the care you need, when you need it.  . Your follow up will be determined after your Echocardiogram  Any Other Special Instructions Will Be Listed Below (If Applicable). You have been referred to [facility]

## 2018-06-21 NOTE — Assessment & Plan Note (Signed)
Pt is significantly anxious about his health.  He is coherent and intelligent  But I suspect there is an underlying psychological issue.

## 2018-06-21 NOTE — Assessment & Plan Note (Addendum)
Unclear this a cardiac issue

## 2018-06-21 NOTE — Telephone Encounter (Signed)
Incoming call from pt with concerns regarding CP, generalized fatigue, and SOB. He states he was sent home from work today d/t CP. Advised pt that if actively having CP he will need to report to ED. Pt refuses to present to ED and feels that they will not take his symptoms seriously since they have not done so in the past. Pt states that ever since he was possibly exposed to what he states may have been bacterial or viral meningitis, his overall health has declined. Spoke with pt extensively; pt is troubled by onset of symptoms in the past few months. He struggled to describe CP and states he is unable to put in words. States when he has CP it is both dull and sharp and 'feels like an ache deep down inside' and states pain accompanied by right arm ache-like pain and neck pain. He states he needs to actively tell himself to take deep breaths because most of his breathing is shallow and that during his episodes of CP he 'can barely breathe' and that his breathing 'feels labored'. He states that his CP has lasted all night before and last episode a few days ago, but he does have a continuous dull pain to his chest. He describes the continuous pain as chest tightness that 'feels like a rope is tied around [his] lung and chest' and 'like someone has a firm hold [on his heart] and isn't letting go'. Pt later states that he gets CP when he tries to relax and describes CP as 'pulsating, aching pains'. Pt states that during onset he gets very tired and his skin 'gets clammy'. Pt expresses concern over quality of life and states that at times he doesn't care about his symptoms any more and just wants to die. He states that at times if he develops CP he will push through the pain regardless of whether or not this may be fatal to him because he does not care if he dies. Pt scheduled for appt today with Corine Shelter, PA. Pt agreeable to present for this appt.

## 2018-06-29 ENCOUNTER — Ambulatory Visit (INDEPENDENT_AMBULATORY_CARE_PROVIDER_SITE_OTHER): Payer: BLUE CROSS/BLUE SHIELD | Admitting: Cardiovascular Disease

## 2018-06-29 DIAGNOSIS — I429 Cardiomyopathy, unspecified: Secondary | ICD-10-CM

## 2018-06-29 DIAGNOSIS — R0789 Other chest pain: Secondary | ICD-10-CM | POA: Diagnosis not present

## 2018-06-29 NOTE — Progress Notes (Signed)
06/29/2018 [name]   1995-11-26  [id number]  Primary Physician Patient, No Pcp Per Primary Cardiologist: Runell Gess MD Milagros Loll, [place], [place]  HPI:  [name] is a 23 y.o.   thin appearing single Caucasian male with no children with multiple tattoos referred by the ER for atypical chest pain. He currently works doing Aeronautical engineer, and also works in a Academic librarian.  I last saw him in the office 06/06/2018, and he saw Corine Shelter, PA-C back on 06/21/2018 as well..  I did obtain a 2D echo on him 02/05/2018 revealing an EF of 40 to 45% with inferolateral and inferior hypokinesia.He has used cocaine in the past. He was evaluated 3 years ago by Dr. Shirlee Latch for atypical chest pain as well found to be musculoskeletal. He was recently evaluated in the emergency room 11/23/2017 with a negative work-up including enzymes and EKGs. He says he has occasional shortness of breath and some atypical/noncardiac chest pain. He was seen in the ER again with chest pain.  I did order a nuclear stress test which he was a "no-show" for and a coronary CTA which could not be performed because of questionable PVCs.  Since I saw him a month ago he says he feels clinically improved.  He did have his Myoview stress test on 06/12/2018 that was read as "intermediate risk with a small partially reversible apical anterior and lateral defect.  He currently denies chest pain or shortness of breath.   No outpatient medications have been marked as taking for the 06/29/18 encounter (Office Visit) with Runell Gess, MD.     No Known Allergies  Social History   Socioeconomic History  . Marital status: Single    Spouse name: Not on file  . Number of children: Not on file  . Years of education: Not on file  . Highest education level: Not on file  Occupational History  . Not on file  Social Needs  . Financial resource strain: Not on file  . Food insecurity:    Worry: Not on file    Inability:  Not on file  . Transportation needs:    Medical: Not on file    Non-medical: Not on file  Tobacco Use  . Smoking status: Former Smoker    Packs/day: 0.50    Types: Cigarettes  . Smokeless tobacco: Never Used  Substance and Sexual Activity  . Alcohol use: Yes    Alcohol/week: 0.0 standard drinks    Comment: occasional   . Drug use: Not Currently    Comment: previously used cocaine in 2016  . Sexual activity: Yes  Lifestyle  . Physical activity:    Days per week: Not on file    Minutes per session: Not on file  . Stress: Not on file  Relationships  . Social connections:    Talks on phone: Not on file    Gets together: Not on file    Attends religious service: Not on file    Active member of club or organization: Not on file    Attends meetings of clubs or organizations: Not on file    Relationship status: Not on file  . Intimate partner violence:    Fear of current or ex partner: Not on file    Emotionally abused: Not on file    Physically abused: Not on file    Forced sexual activity: Not on file  Other Topics Concern  . Not on file  Social History Narrative  . Not on file     Review of Systems: General: negative for chills, fever, night sweats or weight changes.  Cardiovascular: negative for chest pain, dyspnea on exertion, edema, orthopnea, palpitations, paroxysmal nocturnal dyspnea or shortness of breath Dermatological: negative for rash Respiratory: negative for cough or wheezing Urologic: negative for hematuria Abdominal: negative for nausea, vomiting, diarrhea, bright red blood per rectum, melena, or hematemesis Neurologic: negative for visual changes, syncope, or dizziness All other systems reviewed and are otherwise negative except as noted above.    Blood pressure 115/68, pulse 70, height 5\' 9"  (1.753 m), weight 146 lb 6.4 oz (66.4 kg).  General appearance: alert and no distress Neck: no adenopathy, no carotid bruit, no JVD, supple, symmetrical, trachea  midline and thyroid not enlarged, symmetric, no tenderness/mass/nodules Lungs: clear to auscultation bilaterally Heart: regular rate and rhythm, S1, S2 normal, no murmur, click, rub or gallop Extremities: extremities normal, atraumatic, no cyanosis or edema Pulses: 2+ and symmetric Skin: Skin color, texture, turgor normal. No rashes or lesions Neurologic: Alert and oriented X 3, normal strength and tone. Normal symmetric reflexes. Normal coordination and gait  EKG not performed today  ASSESSMENT AND PLAN:   Atypical chest pain History of atypical chest pain with a recent Myoview stress test that was read as "intermediate risk with partially reversible anterior apical lateral defect.  He says his chest pain and shortness of breath has improved.  At this point, I prefer to treat him conservatively.  I have reassured him and will see him back in 3 to 4 months.  Cardiomyopathy (HCC) History of cardiomyopathy of unknown etiology with an EF in the 40 to 45% range.  He has no symptoms of heart failure.  This was performed in October of last year.  He has had in April of that year he had a viral illness so conceivably this could have been viral in nature.  Repeat 2D echo was ordered however the patient never showed up for that apparently.  We may want to perform cardiac MRI in the future to further assess LV function whether or not he has an infiltrative process.      Runell Gess MD FACP,FACC,FAHA, [facility] 06/29/2018 10:34 AM

## 2018-06-29 NOTE — Assessment & Plan Note (Signed)
History of atypical chest pain with a recent Myoview stress test that was read as "intermediate risk with partially reversible anterior apical lateral defect.  He says his chest pain and shortness of breath has improved.  At this point, I prefer to treat him conservatively.  I have reassured him and will see him back in 3 to 4 months.

## 2018-06-29 NOTE — Patient Instructions (Signed)
Medication Instructions:  NO CHANGE  If you need a refill on your cardiac medications before your next appointment, please call your pharmacy.   Lab work: If you have labs (blood work) drawn today and your tests are completely normal, you will receive your results only by: Marland Kitchen MyChart Message (if you have MyChart) OR . A paper copy in the mail If you have any lab test that is abnormal or we need to change your treatment, we will call you to review the results.  Follow-Up: At [facility], you and your health needs are our priority.  As part of our continuing mission to provide you with exceptional heart care, we have created designated Provider Care Teams.  These Care Teams include your primary Cardiologist (physician) and Advanced Practice Providers (APPs -  Physician Assistants and Nurse Practitioners) who all work together to provide you with the care you need, when you need it. You will need a follow up appointment in 4 months.  Please call our office 2 months in advance to schedule this appointment.  You may see Nanetta Batty, MD or one of the following Advanced Practice Providers on your designated Care Team:   Corine Shelter, PA-C Judy Pimple, [place] . Marjie Skiff, PA-C

## 2018-06-29 NOTE — Assessment & Plan Note (Signed)
History of cardiomyopathy of unknown etiology with an EF in the 40 to 45% range.  He has no symptoms of heart failure.  This was performed in October of last year.  He has had in April of that year he had a viral illness so conceivably this could have been viral in nature.  Repeat 2D echo was ordered however the patient never showed up for that apparently.  We may want to perform cardiac MRI in the future to further assess LV function whether or not he has an infiltrative process.

## 2018-10-19 ENCOUNTER — Telehealth: Payer: Self-pay

## 2018-10-19 NOTE — Telephone Encounter (Signed)
lmtcb regarding changing 7/17 appt from virtual to OV

## 2018-10-26 ENCOUNTER — Telehealth: Payer: BLUE CROSS/BLUE SHIELD | Admitting: Cardiovascular Disease

## 2018-11-24 ENCOUNTER — Encounter (HOSPITAL_COMMUNITY): Payer: Self-pay | Admitting: *Deleted

## 2018-11-24 ENCOUNTER — Emergency Department (HOSPITAL_COMMUNITY)
Admission: EM | Admit: 2018-11-24 | Discharge: 2018-11-25 | Disposition: A | Discharge status: Home / Self Care | Payer: Self-pay | Attending: Emergency Medicine | Admitting: Emergency Medicine

## 2018-11-24 ENCOUNTER — Emergency Department (HOSPITAL_COMMUNITY): Payer: Self-pay

## 2018-11-24 ENCOUNTER — Other Ambulatory Visit: Payer: Self-pay

## 2018-11-24 DIAGNOSIS — Z87891 Personal history of nicotine dependence: Secondary | ICD-10-CM | POA: Insufficient documentation

## 2018-11-24 DIAGNOSIS — T148XXA Other injury of unspecified body region, initial encounter: Secondary | ICD-10-CM

## 2018-11-24 DIAGNOSIS — Y998 Other external cause status: Secondary | ICD-10-CM | POA: Insufficient documentation

## 2018-11-24 DIAGNOSIS — Y929 Unspecified place or not applicable: Secondary | ICD-10-CM | POA: Insufficient documentation

## 2018-11-24 DIAGNOSIS — Y9389 Activity, other specified: Secondary | ICD-10-CM | POA: Insufficient documentation

## 2018-11-24 DIAGNOSIS — S4992XA Unspecified injury of left shoulder and upper arm, initial encounter: Secondary | ICD-10-CM

## 2018-11-24 DIAGNOSIS — W25XXXA Contact with sharp glass, initial encounter: Secondary | ICD-10-CM | POA: Insufficient documentation

## 2018-11-24 DIAGNOSIS — J45909 Unspecified asthma, uncomplicated: Secondary | ICD-10-CM | POA: Insufficient documentation

## 2018-11-24 DIAGNOSIS — S41112A Laceration without foreign body of left upper arm, initial encounter: Secondary | ICD-10-CM

## 2018-11-24 DIAGNOSIS — S60512A Abrasion of left hand, initial encounter: Secondary | ICD-10-CM | POA: Insufficient documentation

## 2018-11-24 HISTORY — DX: Unspecified asthma, uncomplicated: J45.909

## 2018-11-24 MED ORDER — LIDOCAINE-EPINEPHRINE (PF) 2 %-1:200000 IJ SOLN
10.0000 mL | Freq: Once | INTRAMUSCULAR | Status: AC
  Administered 2018-11-24: 10 mL
  Filled 2018-11-24: qty 20

## 2018-11-24 MED ORDER — TETANUS-DIPHTH-ACELL PERTUSSIS 5-2.5-18.5 LF-MCG/0.5 IM SUSP
0.5000 mL | Freq: Once | INTRAMUSCULAR | Status: AC
  Administered 2018-11-24: 22:00:00 0.5 mL via INTRAMUSCULAR
  Filled 2018-11-24: qty 0.5

## 2018-11-24 NOTE — ED Triage Notes (Signed)
The pt put his lt elbow through a car window glass  He was angry  Abrasions over all his nuckles lt hand  Laceration across his lt bicept  Bleeding controlled at  The moment

## 2018-11-24 NOTE — ED Triage Notes (Signed)
The pt is very drunk   The bleeding is controlled with a bandage on it  Good strong radial pulse  The pt falls asleep  During  The triage

## 2018-11-24 NOTE — ED Provider Notes (Signed)
[facility] EMERGENCY DEPARTMENT Provider Note   CSN: [id number] Arrival date & time: 11/24/18  2113    History   Chief Complaint Chief Complaint  Patient presents with  . Laceration    HPI [name] is a 23 y.o. male with a history of asthma who presents to the emergency department with a chief complaint of left arm and hand injury.  The patient reports that he punched his hands and left arm through a window approximately 1.5 hours prior to arrival.  He has a laceration to his left upper arm and multiple abrasions and wounds to the left hand.  He is left-hand dominant.  He is unsure when his Tdap was last updated, but thinks it was within the last 10 years.  He reports that he drank approximately 12 beers today.  He does not take any blood thinners.  He is endorsing constant, moderate pain to the left upper arm that is worse with movement, but denies left hand pain, numbness, weakness, or left elbow or shoulder pain.  No treatment prior to arrival.  He denies SI, HI, or auditory visual hallucinations.     The history is provided by the patient. No language interpreter was used.    Past Medical History:  Diagnosis Date  . Acid reflux   . Asthma     Patient Active Problem List   Diagnosis Date Noted  . Weight loss 06/21/2018  . Weakness 06/21/2018  . Cardiomyopathy ([place]) 06/21/2018  . Abnormal cardiac function test 06/21/2018  . Anxiety 06/21/2018  . Atypical chest pain 12/27/2017    Past Surgical History:  Procedure Laterality Date  . ESOPHAGUS SURGERY          Home Medications    Prior to Admission medications   Not on File    Family History No family history on file.  Social History Social History   Tobacco Use  . Smoking status: Former Smoker    Packs/day: 0.50    Types: Cigarettes  . Smokeless tobacco: Never Used  Substance Use Topics  . Alcohol use: Yes    Alcohol/week: 0.0 standard drinks    Comment: occasional   .  Drug use: Not Currently    Comment: previously used cocaine in 2016     Allergies   Patient has no known allergies.   Review of Systems Review of Systems  Constitutional: Negative for appetite change, chills and fever.  Respiratory: Negative for shortness of breath.   Cardiovascular: Negative for chest pain.  Gastrointestinal: Negative for abdominal pain.  Genitourinary: Negative for dysuria.  Musculoskeletal: Positive for arthralgias and myalgias. Negative for back pain, joint swelling, neck pain and neck stiffness.  Skin: Positive for wound. Negative for rash.  Allergic/Immunologic: Negative for immunocompromised state.  Neurological: Negative for weakness, numbness and headaches.  Hematological: Does not bruise/bleed easily.  Psychiatric/Behavioral: Negative for confusion.     Physical Exam Updated Vital Signs BP 103/65 (BP Location: Right Arm)   Pulse 90   Temp 98.3 F (36.8 C)   Resp 16   Ht 5\' 9"  (1.753 m)   Wt 66.4 kg   SpO2 98%   BMI 21.62 kg/m   Physical Exam Vitals signs and nursing note reviewed.  Constitutional:      General: He is not in acute distress.    Appearance: He is well-developed. He is not diaphoretic.  HENT:     Head: Normocephalic and atraumatic.  Eyes:     General: No scleral icterus.  Conjunctiva/sclera: Conjunctivae normal.  Neck:     Musculoskeletal: Normal range of motion.  Cardiovascular:     Rate and Rhythm: Normal rate and regular rhythm.     Heart sounds: Normal heart sounds. No murmur.     Comments: Capillary refill < 2 sec Pulmonary:     Effort: Pulmonary effort is normal. No respiratory distress.     Breath sounds: Normal breath sounds.  Musculoskeletal: Normal range of motion.     Comments: ROM: Full active and passive range of motion of the left shoulder and wrist without tenderness to palpation.  Left elbow range of motion is decreased secondary to laceration.  Skin:    General: Skin is warm and dry.     Capillary  Refill: Capillary refill takes less than 2 seconds.     Comments: Multiple superficial abrasions are noted to the dorsum of the left hand and fingers.  No lacerations or wounds amenable to repair.  There is a 3.5 laceration noted to the left upper arm over the biceps brachii, extending to the level of the subcutaneous tissue.  Underlying biceps brachii and fascia are intact.  A mild amount of oozing is noted, but no pulsating bleeding.  See picture below.   Neurological:     Mental Status: He is alert and oriented to person, place, and time.     Comments: Sensation: Sensation is intact and equal throughout the bilateral upper extremities. Strength: 5 out of 5 strength against resistance to the bilateral upper extremities.           ED Treatments / Results  Labs (all labs ordered are listed, but only abnormal results are displayed) Labs Reviewed - No data to display  EKG None  Radiology Dg Humerus Left  Result Date: 11/24/2018 CLINICAL DATA:  Pain EXAM: LEFT HUMERUS - 2+ VIEW COMPARISON:  None. FINDINGS: There is soft tissue swelling and subcutaneous gas involving the distal lower extremity. There is a small radiopaque foreign body projecting at the anterior ulnar aspect of the distal humerus. There is no acute displaced fracture or dislocation. IMPRESSION: 1. No acute displaced fracture or dislocation. 2. Soft tissue laceration at the level of the distal left humerus. There is a possible radiopaque foreign body as detailed above. Electronically Signed   By: Katherine Mantlehristopher  Green M.D.   On: 11/24/2018 22:52   Dg Hand Complete Left  Result Date: 11/24/2018 CLINICAL DATA:  Pain EXAM: LEFT HAND - COMPLETE 3+ VIEW COMPARISON:  None. FINDINGS: There is no evidence of fracture or dislocation. There is no evidence of arthropathy or other focal bone abnormality. There is soft tissue swelling about the dorsal aspect of the hand. IMPRESSION: Soft tissue swelling without evidence for an acute  displaced fracture or radiopaque foreign body. Electronically Signed   By: Katherine Mantlehristopher  Green M.D.   On: 11/24/2018 22:53    Procedures .Marland Kitchen.Laceration Repair  Date/Time: 11/25/2018 12:34 AM Performed by: Barkley BoardsMcDonald, Deneen Slager A, PA-C Authorized by: Barkley BoardsMcDonald, Amore Grater A, PA-C   Consent:    Consent obtained:  Verbal   Consent given by:  Patient   Risks discussed:  Infection, poor cosmetic result, pain, need for additional repair, poor wound healing and retained foreign body   Alternatives discussed:  No treatment Anesthesia (see MAR for exact dosages):    Anesthesia method:  Local infiltration   Local anesthetic:  Lidocaine 2% WITH epi Laceration details:    Location: left upper arm.   Length (cm):  3   Depth (mm):  0.5 Repair  type:    Repair type:  Intermediate Pre-procedure details:    Preparation:  Patient was prepped and draped in usual sterile fashion and imaging obtained to evaluate for foreign bodies Exploration:    Hemostasis achieved with:  Epinephrine and direct pressure   Wound exploration: wound explored through full range of motion and entire depth of wound probed and visualized     Wound extent: areolar tissue violated     Wound extent: no fascia violation noted, no foreign bodies/material noted, no muscle damage noted, no nerve damage noted, no tendon damage noted, no underlying fracture noted and no vascular damage noted     Contaminated: no   Treatment:    Area cleansed with:  Shur-Clens and saline   Amount of cleaning:  Extensive   Irrigation method:  Pressure wash   Visualized foreign bodies/material removed: no   Subcutaneous repair:    Suture size:  3-0   Wound subcutaneous closure material used: Vicrl Rapide.   Suture technique:  Simple interrupted   Number of sutures:  3 Skin repair:    Repair method:  Sutures   Suture material:  Prolene   Suture technique:  Simple interrupted   Number of sutures:  8 Approximation:    Approximation:  Close Post-procedure details:     Dressing:  Antibiotic ointment and sterile dressing   Patient tolerance of procedure:  Tolerated well, no immediate complications Comments:     After copious and extensive visualization to the base of the wound in a bloodless field, no foreign bodies were visualized.   (including critical care time)  Medications Ordered in ED Medications  lidocaine-EPINEPHrine (XYLOCAINE W/EPI) 2 %-1:200000 (PF) injection 10 mL (10 mLs Infiltration Given 11/24/18 2356)  Tdap (BOOSTRIX) injection 0.5 mL (0.5 mLs Intramuscular Given 11/24/18 2229)     Initial Impression / Assessment and Plan / ED Course  I have reviewed the triage vital signs and the nursing notes.  Pertinent labs & imaging results that were available during my care of the patient were reviewed by me and considered in my medical decision making (see chart for details).        23 year old male with a history of asthma presenting with a left arm and hand injury after punching his hand through a window 1.5 hours prior to arrival.  He is unsure when his Tdap was last updated.  He is left-hand dominant.  Significant EtOH is on board.  On exam, he is neurovascularly intact.  X-ray of the left hand with soft tissue swelling, but negative for acute displaced fracture or radiopaque foreign body.  X-ray of the left humerus is negative for acute fracture and there is a possible radiopaque foreign body projecting at the anterior ulnar aspect of the distal humerus.  Pressure irrigation performed. Wound explored and base of wound visualized in a bloodless field.  There was no evidence of foreign body when the wound was viewed in a bloodless field at the base of the wound.  Laceration occurred < 8 hours prior to repair which was well tolerated. Tdap updated.  Pt has no comorbidities to effect normal wound healing. Pt discharged without antibiotics.  Discussed suture home care with patient and answered questions. Pt to follow-up for wound check and  suture removal in 7-10 days; they are to return to the ED sooner for signs of infection. Pt is hemodynamically stable with no complaints and is clinically sober prior to dc.  He ambulated out of the department with a steady gait.  Final Clinical Impressions(s) / ED Diagnoses   Final diagnoses:  Injury of left upper arm, initial encounter  Laceration of left upper arm, initial encounter  Superficial abrasion    ED Discharge Orders    None       Marq Rebello A, PA-C 11/25/18 0618    Charlynne PanderYao, David Hsienta, MD 11/29/18 [phone]

## 2018-11-24 NOTE — ED Notes (Signed)
Patient keeps coming out of his room, asking if he needs sutures, and asking if he could go get his brother. PAtient has been asked to return to room.

## 2018-11-25 NOTE — ED Notes (Signed)
Patient verbalizes understanding of discharge instructions. Opportunity for questioning and answers were provided. Armband removed by staff, pt discharged from ED.  

## 2018-11-25 NOTE — ED Notes (Signed)
Pt's arm cleaned, bacitracin applied, bandaged, and placed in a sling.

## 2018-11-25 NOTE — Discharge Instructions (Signed)
Thank you for allowing me to care for you today in the Emergency Department.   Your sutures need to be removed in 7 to 10 days.  You can go to [facility] urgent care or return to the ER to have your sutures removed.  If you leave them in longer, and will be more painful to eventually have them removed.  You can wear the sling as needed for comfort.  Avoid wearing it for long periods of time as this may cause stiffness in your arm.  Take 650 mg of Tylenol or 600 mg of ibuprofen with food every 6 hours for pain.  You can alternate between these 2 medications every 3 hours if your pain returns.  For instance, you can take Tylenol at noon, followed by a dose of ibuprofen at 3, followed by second dose of Tylenol and 6.  Your tetanus immunization was updated today and is good for the next 10 years.  To care for your wounds on your arm and hand at home, clean the area at least once daily with warm water and soap.  You can do this while you are in the shower.  Apply a topical antibiotic such as bacitracin or Neosporin to the wound to prevent infection.  If you are at work and there is concern that the wound to get contaminated, cover the area with a bandage.  You should return to the emergency department sooner if you develop signs of infection, which include fever, chills, thick, mucus-like drainage from the area, redness, warmth, swelling, or red streaking from the wound.

## 2018-12-02 ENCOUNTER — Other Ambulatory Visit: Payer: Self-pay

## 2018-12-02 ENCOUNTER — Emergency Department (HOSPITAL_COMMUNITY)
Admission: EM | Admit: 2018-12-02 | Discharge: 2018-12-02 | Disposition: A | Discharge status: Home / Self Care | Payer: Self-pay | Attending: Emergency Medicine | Admitting: Emergency Medicine

## 2018-12-02 DIAGNOSIS — J45909 Unspecified asthma, uncomplicated: Secondary | ICD-10-CM | POA: Insufficient documentation

## 2018-12-02 DIAGNOSIS — Z87891 Personal history of nicotine dependence: Secondary | ICD-10-CM | POA: Insufficient documentation

## 2018-12-02 DIAGNOSIS — X58XXXD Exposure to other specified factors, subsequent encounter: Secondary | ICD-10-CM | POA: Insufficient documentation

## 2018-12-02 DIAGNOSIS — S41112D Laceration without foreign body of left upper arm, subsequent encounter: Secondary | ICD-10-CM | POA: Insufficient documentation

## 2018-12-02 DIAGNOSIS — T8130XA Disruption of wound, unspecified, initial encounter: Secondary | ICD-10-CM

## 2018-12-02 NOTE — ED Triage Notes (Signed)
Pt removed  Some stitches in hiks left arm and the wound came open

## 2018-12-02 NOTE — ED Notes (Signed)
Steri stripe applied to left bicep , closed well dsg applied

## 2018-12-02 NOTE — ED Provider Notes (Signed)
[facility] Provider Note   CSN: [id number] Arrival date & time: 12/02/18  [id number]     History   Chief Complaint No chief complaint on file.   HPI [name] is a 23 y.o. male.     HPI  23 year old male presents today complaining of wound problems left upper arm.  He had stitches placed approximately 1 week ago for laceration to the left arm.  He states that he has had some opening of the wound in this area.  He has not had any redness, pus, discharge, or pain.  Otherwise he has been healing well without difficulties.  Past Medical History:  Diagnosis Date  . Acid reflux   . Asthma     Patient Active Problem List   Diagnosis Date Noted  . Weight loss 06/21/2018  . Weakness 06/21/2018  . Cardiomyopathy (HCC) 06/21/2018  . Abnormal cardiac function test 06/21/2018  . Anxiety 06/21/2018  . Atypical chest pain 12/27/2017    Past Surgical History:  Procedure Laterality Date  . ESOPHAGUS SURGERY          Home Medications    Prior to Admission medications   Not on File    Family History No family history on file.  Social History Social History   Tobacco Use  . Smoking status: Former Smoker    Packs/day: 0.50    Types: Cigarettes  . Smokeless tobacco: Never Used  Substance Use Topics  . Alcohol use: Yes    Alcohol/week: 0.0 standard drinks    Comment: occasional   . Drug use: Not Currently    Comment: previously used cocaine in 2016     Allergies   Patient has no known allergies.   Review of Systems Review of Systems  All other systems reviewed and are negative.    Physical Exam Updated Vital Signs There were no vitals taken for this visit.  Physical Exam Vitals signs and nursing note reviewed.  Constitutional:      Appearance: He is normal weight.  HENT:     Head: Normocephalic.  Musculoskeletal: Normal range of motion.     Comments: Left upper extremity with approximately a 3 cm wound with 4  sutures still in place.  They appear to be healing well.  The middle has no suture and is slightly dehisced.  There is no erythema or discharge.  Skin:    General: Skin is warm.     Capillary Refill: Capillary refill takes less than 2 seconds.  Neurological:     Mental Status: He is alert.      ED Treatments / Results  Labs (all labs ordered are listed, but only abnormal results are displayed) Labs Reviewed - No data to display  EKG None  Radiology No results found.  Procedures Procedures (including critical care time)  Medications Ordered in ED Medications - No data to display   Initial Impression / Assessment and Plan / ED Course  I have reviewed the triage vital signs and the nursing notes.  Pertinent labs & imaging results that were available during my care of the patient were reviewed by me and considered in my medical decision making (see chart for details).        Patient with well-healing left upper extremity wound with slight dehiscence.  Plan Place Steri-Strips.  Patient advised regarding return precautions.  Advised to leave remaining sutures in place for another week.  Final Clinical Impressions(s) / ED Diagnoses   Final diagnoses:  Wound dehiscence    ED Discharge Orders    None       Pattricia Boss, MD 12/02/18 [phone]

## 2019-06-05 ENCOUNTER — Encounter (HOSPITAL_COMMUNITY): Payer: Self-pay | Admitting: Emergency Medicine

## 2019-06-05 ENCOUNTER — Other Ambulatory Visit: Payer: Self-pay

## 2019-06-05 ENCOUNTER — Emergency Department (HOSPITAL_COMMUNITY)
Admission: EM | Admit: 2019-06-05 | Discharge: 2019-06-05 | Discharge status: Against Medical Advice | Payer: Self-pay | Attending: Emergency Medicine | Admitting: Emergency Medicine

## 2019-06-05 DIAGNOSIS — R430 Anosmia: Secondary | ICD-10-CM | POA: Insufficient documentation

## 2019-06-05 DIAGNOSIS — R438 Other disturbances of smell and taste: Secondary | ICD-10-CM | POA: Insufficient documentation

## 2019-06-05 DIAGNOSIS — Z20822 Contact with and (suspected) exposure to covid-19: Secondary | ICD-10-CM | POA: Insufficient documentation

## 2019-06-05 NOTE — ED Provider Notes (Signed)
[facility] EMERGENCY DEPARTMENT Provider Note   CSN: [id number] Arrival date & time: 06/05/19  1117     History Chief Complaint  Patient presents with  . Loss of taste  . Loss of smell    [name] is a 24 y.o. male.  24 y.o male with a PMH of childhood asthma presents to the ED with a chief complaint of loss of taste/loss of smell x yesterday. Patient reports he was applying cologne when he could not smell the fragrance. He reports being exposed to a coworker with Covid 19 about 3 weeks ago but was tested after with a negative results. He denies any symptoms such as, chills, chest pain, shortness of breath, abdominal pain, nausea, vomiting.  The history is provided by the patient.       Past Medical History:  Diagnosis Date  . Acid reflux   . Asthma     Patient Active Problem List   Diagnosis Date Noted  . Weight loss 06/21/2018  . Weakness 06/21/2018  . Cardiomyopathy (HCC) 06/21/2018  . Abnormal cardiac function test 06/21/2018  . Anxiety 06/21/2018  . Atypical chest pain 12/27/2017    Past Surgical History:  Procedure Laterality Date  . ESOPHAGUS SURGERY         History reviewed. No pertinent family history.  Social History   Tobacco Use  . Smoking status: Former Smoker    Packs/day: 0.50    Types: Cigarettes  . Smokeless tobacco: Never Used  Substance Use Topics  . Alcohol use: Yes    Alcohol/week: 0.0 standard drinks    Comment: occasional   . Drug use: Not Currently    Comment: previously used cocaine in 2016    Home Medications Prior to Admission medications   Not on File    Allergies    Patient has no known allergies.  Review of Systems   Review of Systems  Constitutional: Negative for fever.  Respiratory: Negative for shortness of breath.   Cardiovascular: Negative for chest pain.    Physical Exam Updated Vital Signs BP 118/70 (BP Location: Right Arm)   Pulse 88   Temp 98.2 F (36.8 C) (Oral)   Resp  20   SpO2 98%   Physical Exam Vitals and nursing note reviewed.  Constitutional:      Appearance: He is well-developed.  HENT:     Head: Normocephalic and atraumatic.  Eyes:     General: No scleral icterus.    Pupils: Pupils are equal, round, and reactive to light.  Cardiovascular:     Heart sounds: Normal heart sounds.  Pulmonary:     Effort: Pulmonary effort is normal.     Breath sounds: Normal breath sounds. No wheezing.  Chest:     Chest wall: No tenderness.  Abdominal:     General: Bowel sounds are normal. There is no distension.     Palpations: Abdomen is soft.     Tenderness: There is no abdominal tenderness.  Musculoskeletal:        General: No tenderness or deformity.     Cervical back: Normal range of motion.  Skin:    General: Skin is warm and dry.  Neurological:     Mental Status: He is alert and oriented to person, place, and time.     ED Results / Procedures / Treatments   Labs (all labs ordered are listed, but only abnormal results are displayed) Labs Reviewed  SARS CORONAVIRUS 2 (TAT 6-24 HRS)  EKG None  Radiology No results found.  Procedures Procedures (including critical care time)  Medications Ordered in ED Medications - No data to display  ED Course  I have reviewed the triage vital signs and the nursing notes.  Pertinent labs & imaging results that were available during my care of the patient were reviewed by me and considered in my medical decision making (see chart for details).    MDM Rules/Calculators/A&P     With a past medical history of childhood asthma presents to the ED with complaints of loss of taste and smell since yesterday.  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  He was then tested in outpatient center, these results were negative.  He reports his last of taste and smell started yesterday while applying cologne.  He has had no fevers, chills,  chest pain, shortness of breath, other complaints.  Vitals are within normal limits on today's visit, he is afebrile without any tachypnea or tachycardia.  Patient reports he just started a new job therefore he is unable to miss this, I discussed that if he is positive he will need to quarantine at home for the 14 days.  He understands and agrees with this procedure.  Will be provided with a work note.  Return precautions discussed at length.   After leaving patient's room I was informed that patient left AGAINST MEDICAL ADVICE.  [name] was evaluated in Emergency Department on 06/05/2019 for the symptoms described in the history of present illness. He was evaluated in the context of the global COVID-19 pandemic, which necessitated consideration that the patient might be at risk for infection with the SARS-CoV-2 virus that causes COVID-19. Institutional protocols and algorithms that pertain to the evaluation of patients at risk for COVID-19 are in a state of rapid change based on information released by regulatory bodies including the CDC and federal and state organizations. These policies and algorithms were followed during the patient's care in the ED. 16   Portions of this note were generated with L[employer]. Dictation errors may occur despite best attempts at proofreading.  Final Clinical Impression(s) / ED Diagnoses Final diagnoses:  WIOXB-[id number] virus test result unknown    Rx / DC Orders ED Discharge Orders    None       Janeece Fitting, PA-C 06/05/19 [place], Adam, DO 06/05/19 1558

## 2019-06-05 NOTE — ED Triage Notes (Signed)
Pt arrives to ED from home with complaints of losing his taste and smell yesterday. Pt denies fevers, chills, SOB, or chest pain.

## 2019-06-05 NOTE — Discharge Instructions (Signed)
Today you were tested for COVID-19, these results should be available to you within 6 to 24 hours.  Please be aware if he test negative today this does not mean that you are free of this virus, you likely were tested too early.  If your results are positive you will need to quarantine at home for the next 2 weeks.  Provided a work note to help with this matter.

## 2019-06-05 NOTE — ED Notes (Signed)
Pt to nurses station stating he wants to leave. This RN requested pt allow [place] to swab him before he leaves, pt states "nah, if it's just going to be like this im gonna head out". Pt left in NAD, made aware that if he were to return the process would start over.

## 2020-08-15 IMAGING — CR DG CHEST 2V
2 series · 2 of 2 positions shown · non-contrast
Comparison: November 23, 2017

CLINICAL DATA: Chest pain

EXAM:
CHEST - 2 VIEW

[chest pa]
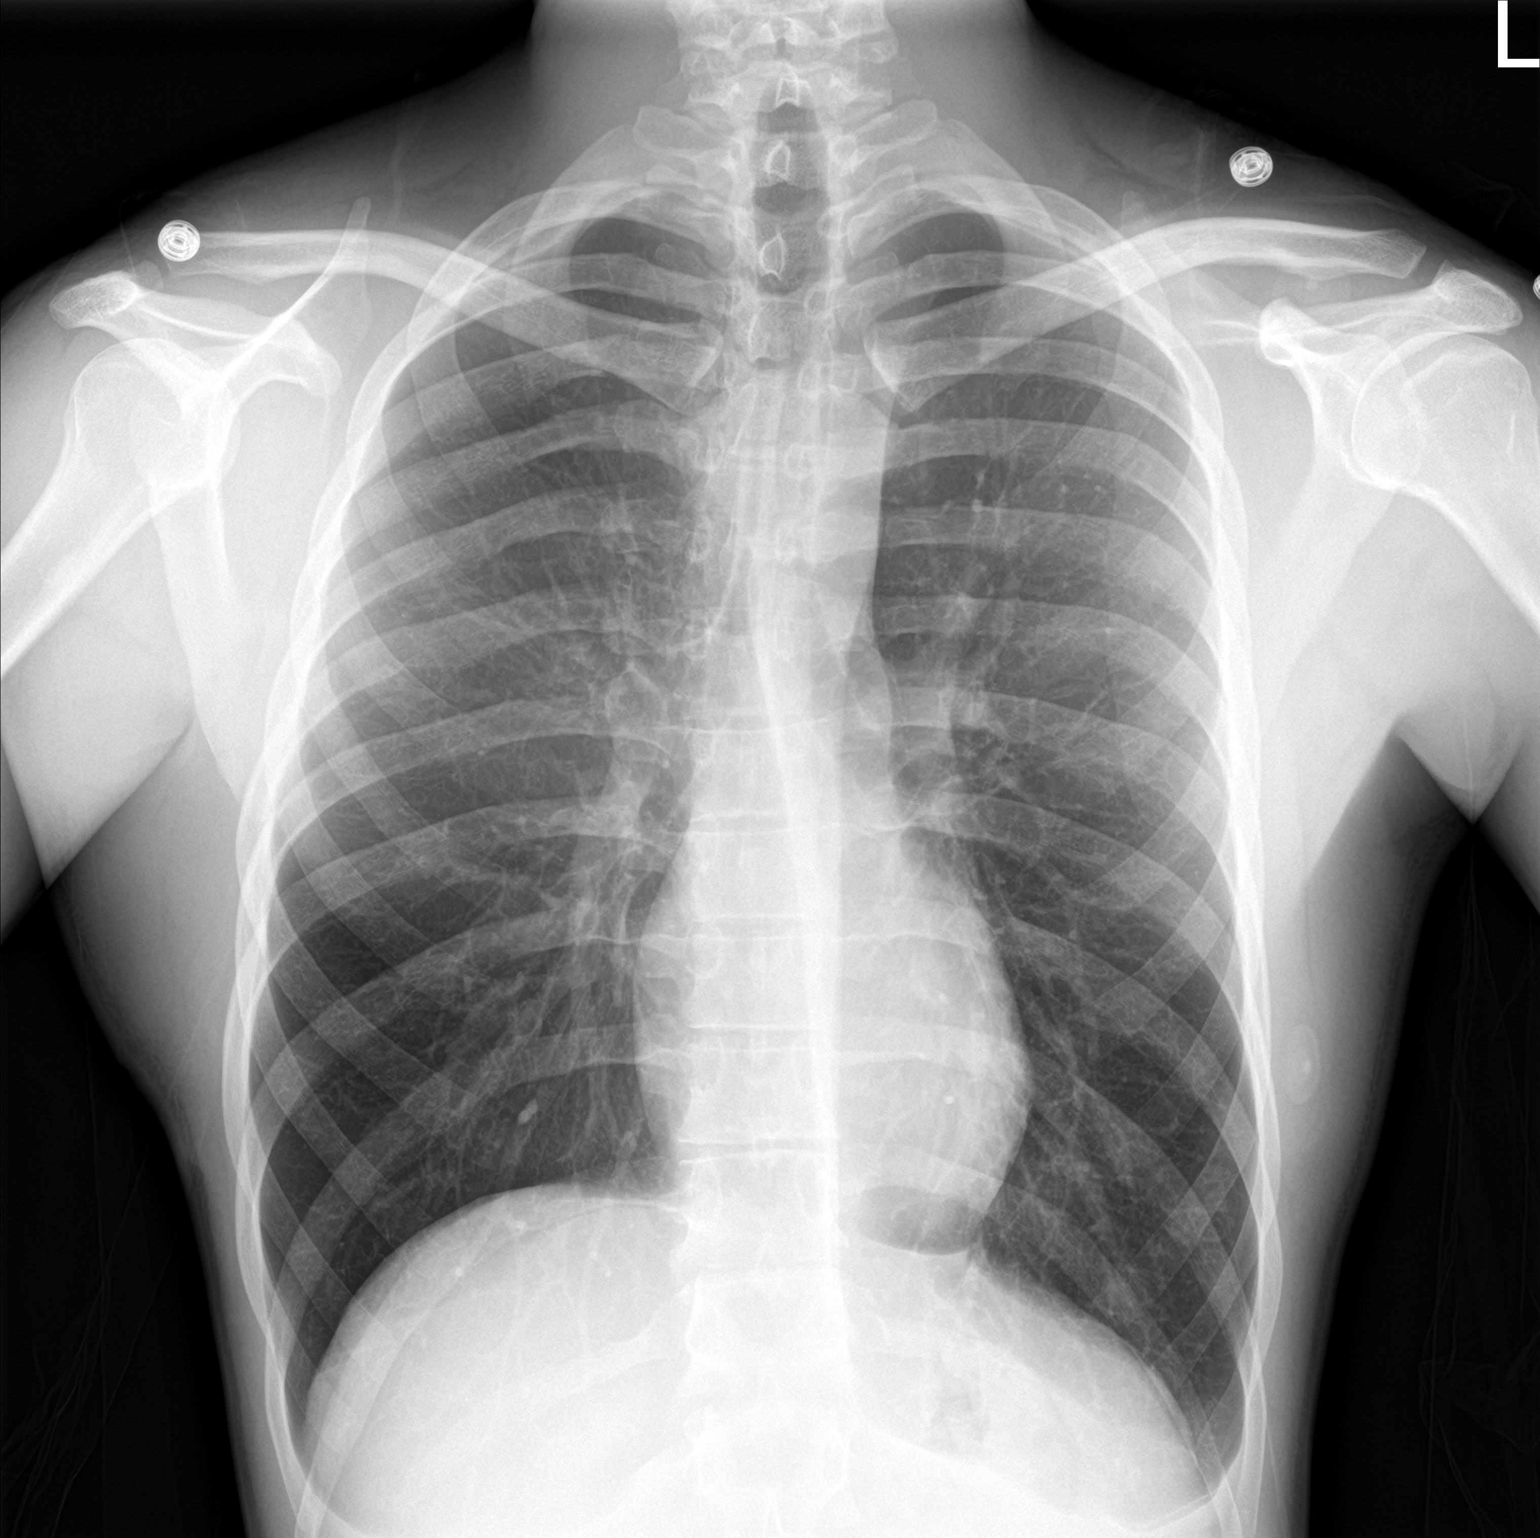

[chest lat]
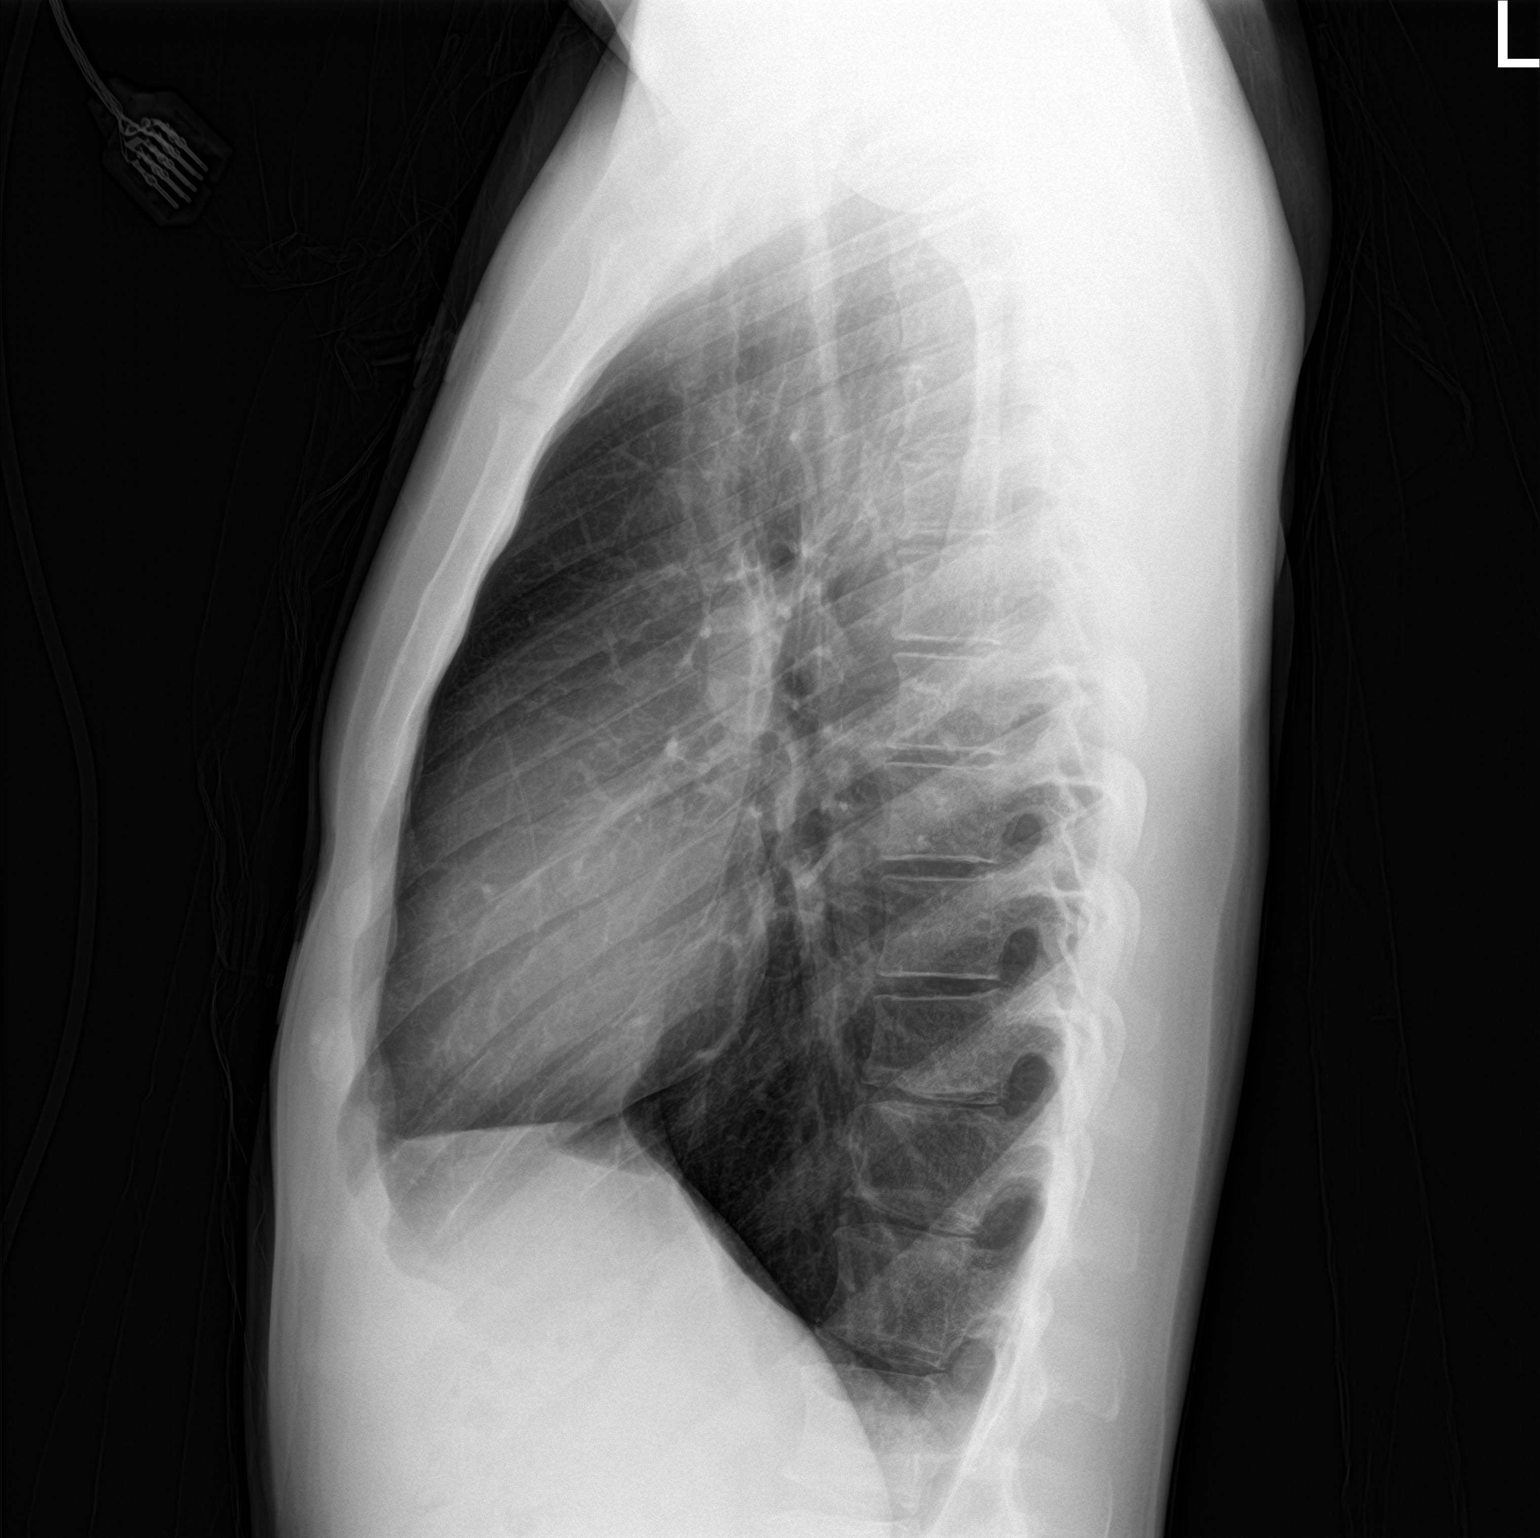

[2 of 2 positions shown; findings below may reference images not displayed]

FINDINGS: There is no edema or consolidation. Heart size and pulmonary
vascularity are within normal limits. No adenopathy. No bone
lesions. No pneumothorax.
IMPRESSION: No edema or consolidation.

## 2022-12-27 ENCOUNTER — Inpatient Hospital Stay (HOSPITAL_COMMUNITY)
Admission: EM | Admit: 2022-12-27 | Discharge: 2023-01-02 | DRG: 603 | Disposition: A | Discharge status: Home / Self Care | Payer: Self-pay | Attending: Internal Medicine | Admitting: Internal Medicine

## 2022-12-27 ENCOUNTER — Emergency Department (HOSPITAL_COMMUNITY): Payer: Self-pay

## 2022-12-27 ENCOUNTER — Other Ambulatory Visit: Payer: Self-pay

## 2022-12-27 DIAGNOSIS — L02413 Cutaneous abscess of right upper limb: Principal | ICD-10-CM | POA: Diagnosis present

## 2022-12-27 DIAGNOSIS — Z560 Unemployment, unspecified: Secondary | ICD-10-CM

## 2022-12-27 DIAGNOSIS — K219 Gastro-esophageal reflux disease without esophagitis: Secondary | ICD-10-CM | POA: Diagnosis present

## 2022-12-27 DIAGNOSIS — J45909 Unspecified asthma, uncomplicated: Secondary | ICD-10-CM | POA: Diagnosis present

## 2022-12-27 DIAGNOSIS — F32A Depression, unspecified: Secondary | ICD-10-CM | POA: Diagnosis present

## 2022-12-27 DIAGNOSIS — M7021 Olecranon bursitis, right elbow: Secondary | ICD-10-CM | POA: Diagnosis present

## 2022-12-27 DIAGNOSIS — Z87891 Personal history of nicotine dependence: Secondary | ICD-10-CM

## 2022-12-27 DIAGNOSIS — F1410 Cocaine abuse, uncomplicated: Secondary | ICD-10-CM | POA: Diagnosis present

## 2022-12-27 DIAGNOSIS — Z556 Problems related to health literacy: Secondary | ICD-10-CM

## 2022-12-27 DIAGNOSIS — Z5900 Homelessness unspecified: Secondary | ICD-10-CM

## 2022-12-27 DIAGNOSIS — L02419 Cutaneous abscess of limb, unspecified: Secondary | ICD-10-CM | POA: Diagnosis present

## 2022-12-27 DIAGNOSIS — Z638 Other specified problems related to primary support group: Secondary | ICD-10-CM

## 2022-12-27 DIAGNOSIS — I428 Other cardiomyopathies: Secondary | ICD-10-CM | POA: Diagnosis present

## 2022-12-27 DIAGNOSIS — F1510 Other stimulant abuse, uncomplicated: Secondary | ICD-10-CM | POA: Diagnosis present

## 2022-12-27 DIAGNOSIS — B9562 Methicillin resistant Staphylococcus aureus infection as the cause of diseases classified elsewhere: Secondary | ICD-10-CM | POA: Diagnosis present

## 2022-12-27 NOTE — ED Triage Notes (Signed)
Patient reports infected right elbow skin abscess with purulent drainage onset this week . Denies fever or chills .

## 2022-12-28 ENCOUNTER — Observation Stay (HOSPITAL_BASED_OUTPATIENT_CLINIC_OR_DEPARTMENT_OTHER): Payer: Self-pay

## 2022-12-28 ENCOUNTER — Encounter (HOSPITAL_COMMUNITY): Payer: Self-pay

## 2022-12-28 DIAGNOSIS — L02419 Cutaneous abscess of limb, unspecified: Secondary | ICD-10-CM | POA: Diagnosis present

## 2022-12-28 DIAGNOSIS — L02413 Cutaneous abscess of right upper limb: Secondary | ICD-10-CM

## 2022-12-28 DIAGNOSIS — I428 Other cardiomyopathies: Secondary | ICD-10-CM

## 2022-12-28 LAB — BASIC METABOLIC PANEL WITH GFR
Anion gap: 12 (ref 5–15)
BUN: 14 mg/dL (ref 6–20)
CO2: 25 mmol/L (ref 22–32)
Calcium: 9 mg/dL (ref 8.9–10.3)
Chloride: 101 mmol/L (ref 98–111)
Creatinine, Ser: 0.81 mg/dL (ref 0.61–1.24)
GFR, Estimated: >60 mL/min (ref >60)
Glucose, Bld: 87 mg/dL (ref 70–99)
Potassium: 3.6 mmol/L (ref 3.5–5.1)
Sodium: 138 mmol/L (ref 135–145)

## 2022-12-28 LAB — CBC WITH DIFFERENTIAL/PLATELET
Abs Immature Granulocytes: 0.02 10*3/uL (ref 0.00–0.07)
Basophils Absolute: 0 10*3/uL (ref 0.0–0.1)
Basophils Relative: 0 %
Eosinophils Absolute: 0.1 10*3/uL (ref 0.0–0.5)
Eosinophils Relative: 2 %
HCT: 42.2 % (ref 39.0–52.0)
Hemoglobin: 14.1 g/dL (ref 13.0–17.0)
Immature Granulocytes: 0 %
Lymphocytes Relative: 23 %
Lymphs Abs: 2 10*3/uL (ref 0.7–4.0)
MCH: 29.7 pg (ref 26.0–34.0)
MCHC: 33.4 g/dL (ref 30.0–36.0)
MCV: 89 fL (ref 80.0–100.0)
Monocytes Absolute: 1.2 10*3/uL — ABNORMAL HIGH (ref 0.1–1.0)
Monocytes Relative: 13 %
Neutro Abs: 5.6 10*3/uL (ref 1.7–7.7)
Neutrophils Relative %: 62 %
Platelets: DECREASED 10*3/uL (ref 150–400)
RBC: 4.74 MIL/uL (ref 4.22–5.81)
RDW: 11.6 % (ref 11.5–15.5)
WBC: 9 10*3/uL (ref 4.0–10.5)
nRBC: 0 % (ref 0.0–0.2)

## 2022-12-28 LAB — HIV ANTIBODY (ROUTINE TESTING W REFLEX): HIV Screen 4th Generation wRfx: NONREACTIVE

## 2022-12-28 LAB — ECHOCARDIOGRAM COMPLETE
Area-P 1/2: 3.7 cm2
Height: 69 in
S' Lateral: 3.3 cm
Weight: 2363.33 [oz_av]

## 2022-12-28 LAB — I-STAT CG4 LACTIC ACID, ED: Lactic Acid, Venous: 0.9 mmol/L (ref 0.5–1.9)

## 2022-12-28 MED ORDER — ACETAMINOPHEN 650 MG RE SUPP: 650.0000 mg | Freq: Four times a day (QID) | RECTAL | Status: DC | PRN

## 2022-12-28 MED ORDER — VANCOMYCIN HCL 1250 MG/250ML IV SOLN
1250.0000 mg | Freq: Two times a day (BID) | INTRAVENOUS | Status: DC
  Administered 2022-12-28 – 2023-01-02 (×10): 1250 mg via INTRAVENOUS
  Filled 2022-12-28 (×12): qty 250

## 2022-12-28 MED ORDER — ONDANSETRON HCL 4 MG PO TABS: 4.0000 mg | ORAL_TABLET | Freq: Four times a day (QID) | ORAL | Status: DC | PRN

## 2022-12-28 MED ORDER — SODIUM CHLORIDE 0.9% FLUSH
3.0000 mL | Freq: Two times a day (BID) | INTRAVENOUS | Status: DC
  Administered 2022-12-28 – 2023-01-01 (×7): 3 mL via INTRAVENOUS

## 2022-12-28 MED ORDER — ONDANSETRON HCL 4 MG/2ML IJ SOLN: 4.0000 mg | Freq: Four times a day (QID) | INTRAMUSCULAR | Status: DC | PRN

## 2022-12-28 MED ORDER — TOPIRAMATE 25 MG PO TABS
25.0000 mg | ORAL_TABLET | Freq: Every day | ORAL | Status: DC
  Filled 2022-12-28: qty 1

## 2022-12-28 MED ORDER — ACETAMINOPHEN 325 MG PO TABS: 650.0000 mg | ORAL_TABLET | Freq: Four times a day (QID) | ORAL | Status: DC | PRN

## 2022-12-28 MED ORDER — ENOXAPARIN SODIUM 40 MG/0.4ML IJ SOSY
40.0000 mg | PREFILLED_SYRINGE | INTRAMUSCULAR | Status: DC
  Administered 2022-12-29 – 2022-12-31 (×3): 40 mg via SUBCUTANEOUS
  Filled 2022-12-28 (×5): qty 0.4

## 2022-12-28 MED ORDER — KETOROLAC TROMETHAMINE 15 MG/ML IJ SOLN: 15.0000 mg | Freq: Four times a day (QID) | INTRAMUSCULAR | Status: AC | PRN

## 2022-12-28 MED ORDER — VANCOMYCIN HCL 1500 MG/300ML IV SOLN
1500.0000 mg | Freq: Once | INTRAVENOUS | Status: AC
  Administered 2022-12-28: 1500 mg via INTRAVENOUS
  Filled 2022-12-28: qty 300

## 2022-12-28 NOTE — ED Provider Notes (Signed)
[facility] EMERGENCY DEPARTMENT AT [facility] Provider Note   CSN: [id number] Arrival date & time: 12/27/22  1912     History  Chief Complaint  Patient presents with   Abscess    [name] is a 27 y.o. male.  Patient with history of substance abuse, cardiomyopathy, homelessness presents today with complaints of right elbow abscess. He states that he felt a pain like something bit him a few days ago and noted that the area was swelling since then. He tried to squeeze and drain the area 2 days ago without success and notes that since then the area has become larger and redness is tracking around his elbow and seems to be continuing to enlarge. He states that earlier today the area opened up and started draining purulent fluid. He denies any fevers or chills. Notes that he is able to range his elbow with only mild discomfort. Denies IVDU, states 'I use ice sometimes but I dont inject it.' He does note that he is currently homeless. Denies any history of similar symptoms previously.   The history is provided by the patient. No language interpreter was used.  Abscess      Home Medications Prior to Admission medications   Not on File      Allergies    Patient has no known allergies.    Review of Systems   Review of Systems  Skin:  Positive for wound.  All other systems reviewed and are negative.   Physical Exam Updated Vital Signs BP 106/78 (BP Location: Left Arm)   Pulse 71   Temp 98.8 F (37.1 C) (Oral)   Resp 16   Ht 5\' 9"  (1.753 m)   Wt 67 kg   SpO2 100%   BMI 21.81 kg/m  Physical Exam Vitals and nursing note reviewed.  Constitutional:      General: He is not in acute distress.    Appearance: Normal appearance. He is normal weight. He is not ill-appearing, toxic-appearing or diaphoretic.  HENT:     Head: Normocephalic and atraumatic.  Cardiovascular:     Rate and Rhythm: Normal rate.  Pulmonary:     Effort: Pulmonary effort is normal. No  respiratory distress.  Musculoskeletal:        General: Normal range of motion.     Cervical back: Normal range of motion.     Comments: Mild discomfort with ROM of the right elbow.  Distal pulses and sensation intact and 2+.  Skin:    General: Skin is warm and dry.     Comments: Dorsal aspect of right elbow with open wound draining purulence. No crepitus. Erythema tracking around the elbow. See images below for further.  Patients skin visualized, no track marks seen  Neurological:     General: No focal deficit present.     Mental Status: He is alert.  Psychiatric:        Mood and Affect: Mood normal.        Behavior: Behavior normal.     ED Results / Procedures / Treatments   Labs (all labs ordered are listed, but only abnormal results are displayed) Labs Reviewed  CBC WITH DIFFERENTIAL/PLATELET - Abnormal; Notable for the following components:      Result Value   Monocytes Absolute 1.2 (*)    All other components within normal limits  CULTURE, BLOOD (ROUTINE X 2)  CULTURE, BLOOD (ROUTINE X 2)  BASIC METABOLIC PANEL  I-STAT CG4 LACTIC ACID, ED  EKG None  Radiology DG Elbow Complete Right  Result Date: 12/27/2022 CLINICAL DATA:  Swelling.  Abscess with draining wound. EXAM: RIGHT ELBOW - COMPLETE 4 VIEW COMPARISON:  None Available. FINDINGS: No fracture or dislocation. Preserved joint spaces and bone mineralization. No joint effusion on lateral view. There is dorsal soft tissue swelling about the olecranon with some soft tissue irregularity and lucency in this location. Please correlate for the exact location of soft tissue abnormality. No underlying bony erosive changes. If there is further concern of bone or soft tissue infection, additional cross-sectional study could be useful as clinically appropriate. IMPRESSION: Dorsal soft tissue swelling with soft tissue gas. Please correlate with exact location of soft tissue abnormality. No bony erosive changes or joint effusion  seen by x-ray at this time Electronically Signed   By: Karen Kays M.D.   On: 12/27/2022 20:48    Procedures Procedures    Medications Ordered in ED Medications  vancomycin (VANCOREADY) IVPB 1500 mg/300 mL (has no administration in time range)    ED Course/ Medical Decision Making/ A&P                                 Medical Decision Making Amount and/or Complexity of Data Reviewed Labs: ordered. Radiology: ordered.  Risk Prescription drug management.   This patient is a 27 y.o. male who presents to the ED for concern of right elbow abscess, this involves an extensive number of treatment options, and is a complaint that carries with it a high risk of complications and morbidity. The emergent differential diagnosis prior to evaluation includes, but is not limited to,  septic arthritis, septic bursitis, necrotizing fascitis, sepsis, abscess with cellulitis . This is not an exhaustive differential.   Past Medical History / Co-morbidities / Social History:  has a past medical history of Acid reflux and Asthma.  Patient is homeless with poor health literacy  Additional history: Chart reviewed. Patient apparently had an echo in 2020 that schowed an EF of 40-45% without clear etiology. Appears patient may have been lost to follow-up as no recent cardiology evaluation seen.  Physical Exam: Physical exam performed. The pertinent findings include: see images above for further, open wound to dorsal aspect of right elbow actively draining purulence with surrounding erythema swelling and warmth. ROM with mild discomfort. No crepitus  Lab Tests: I ordered, and personally interpreted labs.  The pertinent results include:  no leukocytosis, lactic WNL, cultures pending   Imaging Studies: I ordered imaging studies including elbow xray. I independently visualized and interpreted imaging which showed   Dorsal soft tissue swelling with soft tissue gas. Please correlate with exact location of  soft tissue abnormality.   No bony erosive changes or joint effusion seen by x-ray at this time  I agree with the radiologist interpretation.   Medications: I ordered medication including vancomycin  for concern for purulent abscess with soft tissue gas. Reevaluation of the patient after these medicines showed that the patient stayed the same. I have reviewed the patients home medicines and have made adjustments as needed.  Consultations Obtained: I requested consultation with the radiologist who read the x-ray,  and discussed lab and imaging findings as well as pertinent plan - they recommend: they confirm presence of soft tissue gas on imaging and recommend MRI imaging to evaluate for deep tracking infection   Disposition: After consideration of the diagnostic results and the patients response to treatment, I  feel that patient will need admission for right elbow abscess with cellulitis. Patient is homeless with limited access to resources. He also seems to have limited understanding of his condition. I am extremely concerned that discharge with oral antibiotics with be unsuccessful. Discussed admission plan with patient who is understanding and in agreement with this plan.  Discussed patient with hospitalist who accepts patient for admission.   I discussed this case with my attending physician Dr. Bebe Shaggy who cosigned this note including patient's presenting symptoms, physical exam, and planned diagnostics and interventions. Attending physician stated agreement with plan or made changes to plan which were implemented.    Final Clinical Impression(s) / ED Diagnoses Final diagnoses:  Abscess of right elbow    Rx / DC Orders ED Discharge Orders     None         Vear Clock 12/28/22 1610    Zadie Rhine, MD 12/28/22 2308

## 2022-12-28 NOTE — Plan of Care (Signed)
Patient alert/oriented X4. Patient VSS upon arrival and compliant with medication administration. Wound assessed with Gilman Buttner, RN on R elbow and redressed with gauze wrap. Patient personal belongings packed up at bedside. Patient has no complaints or concerns at this time.

## 2022-12-28 NOTE — Progress Notes (Signed)
ED Pharmacy Antibiotic Sign Off An antibiotic consult was received from an ED provider for vancomycin per pharmacy dosing for wound infection. A chart review was completed to assess appropriateness. Reported skin abscess on right elbow with purulent drainage  The following one time order(s) were placed:   Vancomycin 1500mg  IV x1  Further antibiotic and/or antibiotic pharmacy consults should be ordered by the admitting provider if indicated.   Thank you for allowing pharmacy to be a part of this patient's care.   Arabella Merles, [facility]  Clinical Pharmacist 12/28/22 5:23 AM

## 2022-12-28 NOTE — Consult Note (Signed)
Reason for Consult:Right olecranon bursitis Referring Physician: Clare Larsen Time called: 0730 Time at bedside: 6045   [name] is an 27 y.o. male.  HPI: [name] comes to the ED with a week-long hx/o right elbow swelling and pain. He denies any antecedent event or prior hx/o similar. He denies fevers, chills, sweats, N/V. While he was in the waiting room it began to drain on its own. He is LHD, unemployed, and homeless.  Past Medical History:  Diagnosis Date   Acid reflux    Asthma     Past Surgical History:  Procedure Laterality Date   ESOPHAGUS SURGERY      History reviewed. No pertinent family history.  Social History:  reports that he has quit smoking. His smoking use included cigarettes. He has never used smokeless tobacco. He reports current alcohol use. He reports that he does not currently use drugs.  Allergies: No Known Allergies  Medications: I have reviewed the patient's current medications.  Results for orders placed or performed during the hospital encounter of 12/27/22 (from the past 48 hour(s))  CBC with Differential     Status: Abnormal   Collection Time: 12/28/22  4:30 AM  Result Value Ref Range   WBC 9.0 4.0 - 10.5 K/uL   RBC 4.74 4.22 - 5.81 MIL/uL   Hemoglobin 14.1 13.0 - 17.0 g/dL   HCT 40.9 81.1 - 91.4 %   MCV 89.0 80.0 - 100.0 fL   MCH 29.7 26.0 - 34.0 pg   MCHC 33.4 30.0 - 36.0 g/dL   RDW 78.2 95.6 - 21.3 %   Platelets  150 - 400 K/uL    PLATELET CLUMPS NOTED ON SMEAR, COUNT APPEARS DECREASED    Comment: REPEATED TO VERIFY   nRBC 0.0 0.0 - 0.2 %   Neutrophils Relative % 62 %   Neutro Abs 5.6 1.7 - 7.7 K/uL   Lymphocytes Relative 23 %   Lymphs Abs 2.0 0.7 - 4.0 K/uL   Monocytes Relative 13 %   Monocytes Absolute 1.2 (H) 0.1 - 1.0 K/uL   Eosinophils Relative 2 %   Eosinophils Absolute 0.1 0.0 - 0.5 K/uL   Basophils Relative 0 %   Basophils Absolute 0.0 0.0 - 0.1 K/uL   Immature Granulocytes 0 %   Abs Immature Granulocytes 0.02 0.00 -  0.07 K/uL    Comment: Performed at [facility], [address] [place], [place] [address]  Basic metabolic panel     Status: None   Collection Time: 12/28/22  4:30 AM  Result Value Ref Range   Sodium 138 135 - 145 mmol/L   Potassium 3.6 3.5 - 5.1 mmol/L   Chloride 101 98 - 111 mmol/L   CO2 25 22 - 32 mmol/L   Glucose, Bld 87 70 - 99 mg/dL    Comment: Glucose reference range applies only to samples taken after fasting for at least 8 hours.   BUN 14 6 - 20 mg/dL   Creatinine, Ser 8.46 0.61 - 1.24 mg/dL   Calcium 9.0 8.9 - 96.2 mg/dL   GFR, Estimated >95 >28 mL/min    Comment: (NOTE) Calculated using the CKD-EPI Creatinine Equation (2021)    Anion gap 12 5 - 15    Comment: Performed at [facility], [address], [place], [place] [address]  I-Stat CG4 Lactic Acid     Status: None   Collection Time: 12/28/22  4:37 AM  Result Value Ref Range   Lactic Acid, Venous 0.9  0.5 - 1.9 mmol/L    DG Elbow Complete Right  Result Date: 12/27/2022 CLINICAL DATA:  Swelling.  Abscess with draining wound. EXAM: RIGHT ELBOW - COMPLETE 4 VIEW COMPARISON:  None Available. FINDINGS: No fracture or dislocation. Preserved joint spaces and bone mineralization. No joint effusion on lateral view. There is dorsal soft tissue swelling about the olecranon with some soft tissue irregularity and lucency in this location. Please correlate for the exact location of soft tissue abnormality. No underlying bony erosive changes. If there is further concern of bone or soft tissue infection, additional cross-sectional study could be useful as clinically appropriate. IMPRESSION: Dorsal soft tissue swelling with soft tissue gas. Please correlate with exact location of soft tissue abnormality. No bony erosive changes or joint effusion seen by x-ray at this time Electronically Signed   By: [name] M.D.   On: 12/27/2022 20:48    Review of Systems  Constitutional:  Negative for chills, diaphoresis and fever.   HENT:  Negative for ear discharge, ear pain, hearing loss and tinnitus.   Eyes:  Negative for photophobia and pain.  Respiratory:  Negative for cough and shortness of breath.   Cardiovascular:  Negative for chest pain.  Gastrointestinal:  Negative for abdominal pain, nausea and vomiting.  Genitourinary:  Negative for dysuria, flank pain, frequency and urgency.  Musculoskeletal:  Positive for arthralgias (Right elbow). Negative for back pain, myalgias and neck pain.  Neurological:  Negative for dizziness and headaches.  Hematological:  Does not bruise/bleed easily.  Psychiatric/Behavioral:  The patient is not nervous/anxious.    Blood pressure 93/61, pulse 76, temperature 98.7 F (37.1 C), temperature source Oral, resp. rate 16, height 5\' 9"  (1.753 m), weight 67 kg, SpO2 100%. Physical Exam Constitutional:      General: He is not in acute distress.    Appearance: He is well-developed. He is not diaphoretic.  HENT:     Head: Normocephalic and atraumatic.  Eyes:     General: No scleral icterus.       Right eye: No discharge.        Left eye: No discharge.     Conjunctiva/sclera: Conjunctivae normal.  Cardiovascular:     Rate and Rhythm: Normal rate and regular rhythm.  Pulmonary:     Effort: Pulmonary effort is normal. No respiratory distress.  Musculoskeletal:     Cervical back: Normal range of motion.     Comments: Right shoulder, elbow, wrist, digits- Ulceration post elbow with active purulent discharge, no fluctuance, mod TTP, no instability, no blocks to motion  Sens  Ax/R/M/U intact  Mot   Ax/ R/ PIN/ M/ AIN/ U intact  Rad 2+  Skin:    General: Skin is warm and dry.  Neurological:     Mental Status: He is alert.  Psychiatric:        Mood and Affect: Mood normal.        Behavior: Behavior normal.     Assessment/Plan: Right olecranon bursitis -- Given that it is spontaneously draining and his homelessness I will defer I&D and see how he does on abx over the next 24h.  No WB or ROM restrictions. He may shower with dressing off.    Freeman Caldron, PA-C Orthopedic Surgery [phone] 12/28/2022, 8:58 AM

## 2022-12-28 NOTE — H&P (Addendum)
History and Physical    Patient: [name] JXB:[id number] DOB: 02-Jun-1995 DOA: 12/27/2022 DOS: the patient was seen and examined on 12/28/2022 PCP: Patient, No Pcp Per  Patient coming from: [facility] familiar with [facility]-has tent and sleeping bag but has difficulty finding a consistent place to camp-no family support.   Anticipated discharge disposition: Homeless-back to the streets  Chief Complaint:  Chief Complaint  Patient presents with   Abscess   HPI: [name] is a 27 y.o. male with medical history significant of asthma, cardiomyopathy with reduced ejection fraction and regular usage of cocaine and methamphetamines.Patient reported about 1 week ago he noticed what he described as a "zit" on his right elbow.  The area began to enlarge over time and about 12 hours prior to presentation he attempted to "pop" this area and it only made it worse.  By the time he presented he had a large quarter to 57 pea-sized area that was open and draining purulent effluent.  He denied fever or chills.  He states the area was painful.  He admits to poor skin hygiene due to homeless situation.  He denies IV drug abuse or skin popping.  In the ED he was afebrile, his white count was normal at 9000, he was hemodynamically stable, and he was not hypoxic.  Additional labs were all within normal ranges.  Plain x-ray of the right elbow only revealed superficial/subcutaneous tissue swelling with gas.  No bony erosive changes or joint effusion seen on plain x-ray.  Hospital service was asked to evaluate the patient for admission.  In further discussion with the patient he confirms his drug history and states he last utilized crack by smoking 24 hours prior to presentation.  He feels he has some sort of undiagnosed psych issue.  He states he prefers not to take chronic medication but is amenable to taking medication to treat any potential stimulant withdrawal symptoms.  He reports difficulty maintaining employment  status but is very vague about why.  He attributes all of his difficulties to remaining homeless but he also admits to an needing assistance with substance abuse cessation.  He tends to lean towards inpatient treatment so he will have somewhere to stay even if it is just temporary.  I did discuss with him the likelihood of outpatient treatment services and the need for total commitment to such services before he could improve and begin to maintain meaningful employment status. Review of Systems: As mentioned in the history of present illness. All other systems reviewed and are negative. Past Medical History:  Diagnosis Date   Acid reflux    Asthma    Past Surgical History:  Procedure Laterality Date   ESOPHAGUS SURGERY     Social History:  reports that he has quit smoking. His smoking use included cigarettes. He has never used smokeless tobacco. He reports current alcohol use. He reports that he does not currently use drugs.  No Known Allergies  History reviewed. No pertinent family history.  Prior to Admission medications   Not on File    Physical Exam: Vitals:   12/28/22 0546 12/28/22 0600 12/28/22 0630 12/28/22 0723  BP: 104/62 117/68 (!) 105/57 93/61  Pulse: 75 80 72 76  Resp:   16 16  Temp:   98.7 F (37.1 C) 98.7 F (37.1 C)  TempSrc:   Oral Oral  SpO2: 100% 100% 100% 100%  Weight:      Height:       Constitutional: NAD, calm, comfortable  Respiratory: clear to auscultation bilaterally, no wheezing, no crackles. Normal respiratory effort. No accessory muscle use.  Air Cardiovascular: Regular rate and rhythm, no murmurs / rubs / gallops. No extremity edema. 2+ pedal pulses.  Abdomen: no tenderness, no masses palpated. No hepatosplenomegaly. Bowel sounds positive.  Musculoskeletal: no clubbing / cyanosis. No joint deformity upper and lower extremities. Good ROM, no contractures. Normal muscle tone.  Skin: Normal except for a large area on the right elbow which is currently  covered with a dressing.  See picture below. Neurologic: CN 2-12 grossly intact. Sensation intact, DTR normal. Strength 5/5 x all 4 extremities.  Psychiatric: Appears to have some lack of insight as to why he continues to abuse substances and how this is affecting his day-to-day life including his homelessness and employment issues.  Alert and oriented x 3. Normal mood.    Data Reviewed:  As per HPI  Assessment and Plan: Soft tissue draining abscess right elbow At this point does not appear consistent with osteomyelitis or septic joint Orthopedic team is consulted.  Would like to observe patient for 24 more hours before determining if an appropriate surgical candidate.  Concerns are patient's homeless discharge disposition and how this will impact appropriate wound care management. For now we will utilize IV vancomycin and de-escalate if MRSA PCR is negative Follow-up on blood cultures and HIV Wound care every shift-will cover with nonadherent dressing and wrap w/gauze Utilize nonnarcotic analgesia    Known history of nonischemic cardiomyopathy Echocardiogram 2019 revealed moderate LV dysfunction with segmental wall motion abnormalities Subsequent coronary CTA showed no evidence of coronary disease Will need follow-up echocardiogram prior to discharge  Substance abuse/suspected underlying nondiagnosed psychiatric illness Primary substances are methamphetamine and cocaine which he smokes Denies IV drug abuse Lack of insight into how drug abuse is impacting homeless situation appointment opportunities; patient admits to having poor relationships with family members including his mother Patient wishes to "deal with my issues on my own" and is reluctant to use any type of pharmacotherapy to assist in treatment of the possible psychiatric issues He did agree to short-term therapy while in the hospital to assist with stimulant withdrawal.  I have initiated Topamax at 25 mg daily.  Can titrate  up to a max dose of 100 to 150 mg to decrease cravings in the outpatient setting but it is doubtful patient will agree to this plan  Advance Care Planning:   Code Status: Full Code   VTE prophylaxis: Lovenox  Consults: Orthopedics  Family Communication: Patient only  Severity of Illness: The appropriate patient status for this patient is OBSERVATION. Observation status is judged to be reasonable and necessary in order to provide the required intensity of service to ensure the patient's safety. The patient's presenting symptoms, physical exam findings, and initial radiographic and laboratory data in the context of their medical condition is felt to place them at decreased risk for further clinical deterioration. Furthermore, it is anticipated that the patient will be medically stable for discharge from the hospital within 2 midnights of admission.   Author: Junious Silk, NP 12/28/2022 10:27 AM  For on call review [email or web address].

## 2022-12-28 NOTE — Hospital Course (Addendum)
[name] is a 27 y.o. M with polysubstance abuse, depression, cardiomyopathy NOS who presented with few days progressive redness, swelling, fluctuance and pain of the right elbow.

## 2022-12-28 NOTE — Progress Notes (Signed)
Pharmacy Antibiotic Note  [name] is a 27 y.o. 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 Patient with history of substance abuse, cardiomyopathy, homelessness. Pharmacy has been consulted for vancomycin dosing.  Plan: Vancomycin 1250mg  IV q12 hours (eAUC 470) Monitor renal function and orders levels as appropriate   Height: 5\' 9"  (175.3 cm) Weight: 67 kg (147 lb 11.3 oz) IBW/kg (Calculated) : 70.7  Temp (24hrs), Avg:98.5 F (36.9 C), Min:98 F (36.7 C), Max:98.8 F (37.1 C)  Recent Labs  Lab 12/28/22 0430 12/28/22 0437  WBC 9.0  --   CREATININE 0.81  --   LATICACIDVEN  --  0.9    Estimated Creatinine Clearance: 127.5 mL/min (by C-G formula based on SCr of 0.81 mg/dL).    No Known Allergies  Antimicrobials this admission: Vancomycin 9/18>>    Thank you for allowing pharmacy to be a part of this patient's care.  Toniann Fail Uchechi [place] 12/28/2022 10:03 AM

## 2022-12-28 NOTE — ED Notes (Signed)
Pt called X3. Pt could not be found.

## 2022-12-28 NOTE — ED Notes (Signed)
ED TO INPATIENT HANDOFF REPORT  ED Nurse Name and Phone #: [phone]  S Name/Age/Gender [name] 27 y.o. male Room/Bed: H013C/H013C  Code Status   Code Status: Not on file  Home/SNF/Other Home Patient oriented to: self, place, time, and situation Is this baseline? Yes   Triage Complete: Triage complete  Chief Complaint Abscess of elbow [L02.419]  Triage Note Patient reports infected right elbow skin abscess with purulent drainage onset this week . Denies fever or chills .    Allergies No Known Allergies  Level of Care/Admitting Diagnosis ED Disposition     ED Disposition  Admit   Condition  --   Comment  Hospital Area: [facility] [[id number]]  Level of Care: Med-Surg [16]  May place patient in observation at [facility] or [facility] if equivalent level of care is available:: Yes  Covid Evaluation: Asymptomatic - no recent exposure (last 10 days) testing not required  Diagnosis: Abscess of elbow [272536]  Admitting Physician: Alberteen Sam [[id number]]  Attending Physician: Alberteen Sam [[id number]]          B Medical/Surgery History Past Medical History:  Diagnosis Date   Acid reflux    Asthma    Past Surgical History:  Procedure Laterality Date   ESOPHAGUS SURGERY       A IV Location/Drains/Wounds Patient Lines/Drains/Airways Status     Active Line/Drains/Airways     Name Placement date Placement time Site Days   Peripheral IV 12/28/22 20 G 1" Anterior;Left;Proximal Forearm 12/28/22  0425  Forearm  less than 1            Intake/Output Last 24 hours No intake or output data in the 24 hours ending 12/28/22 0856  Labs/Imaging Results for orders placed or performed during the hospital encounter of 12/27/22 (from the past 48 hour(s))  CBC with Differential     Status: Abnormal   Collection Time: 12/28/22  4:30 AM  Result Value Ref Range   WBC 9.0 4.0 - 10.5 K/uL   RBC 4.74 4.22 - 5.81 MIL/uL   Hemoglobin  14.1 13.0 - 17.0 g/dL   HCT 87.5 64.3 - 32.9 %   MCV 89.0 80.0 - 100.0 fL   MCH 29.7 26.0 - 34.0 pg   MCHC 33.4 30.0 - 36.0 g/dL   RDW 51.8 84.1 - 66.0 %   Platelets  150 - 400 K/uL    PLATELET CLUMPS NOTED ON SMEAR, COUNT APPEARS DECREASED    Comment: REPEATED TO VERIFY   nRBC 0.0 0.0 - 0.2 %   Neutrophils Relative % 62 %   Neutro Abs 5.6 1.7 - 7.7 K/uL   Lymphocytes Relative 23 %   Lymphs Abs 2.0 0.7 - 4.0 K/uL   Monocytes Relative 13 %   Monocytes Absolute 1.2 (H) 0.1 - 1.0 K/uL   Eosinophils Relative 2 %   Eosinophils Absolute 0.1 0.0 - 0.5 K/uL   Basophils Relative 0 %   Basophils Absolute 0.0 0.0 - 0.1 K/uL   Immature Granulocytes 0 %   Abs Immature Granulocytes 0.02 0.00 - 0.07 K/uL    Comment: Performed at [facility], [address], [place], [place] [address]  Basic metabolic panel     Status: None   Collection Time: 12/28/22  4:30 AM  Result Value Ref Range   Sodium 138 135 - 145 mmol/L   Potassium 3.6 3.5 - 5.1 mmol/L   Chloride 101 98 - 111 mmol/L   CO2 25 22 -  32 mmol/L   Glucose, Bld 87 70 - 99 mg/dL    Comment: Glucose reference range applies only to samples taken after fasting for at least 8 hours.   BUN 14 6 - 20 mg/dL   Creatinine, Ser 5.40 0.61 - 1.24 mg/dL   Calcium 9.0 8.9 - 98.1 mg/dL   GFR, Estimated >19 >14 mL/min    Comment: (NOTE) Calculated using the CKD-EPI Creatinine Equation (2021)    Anion gap 12 5 - 15    Comment: Performed at [facility], [address], [place], [place] [address]  I-Stat CG4 Lactic Acid     Status: None   Collection Time: 12/28/22  4:37 AM  Result Value Ref Range   Lactic Acid, Venous 0.9 0.5 - 1.9 mmol/L   DG Elbow Complete Right  Result Date: 12/27/2022 CLINICAL DATA:  Swelling.  Abscess with draining wound. EXAM: RIGHT ELBOW - COMPLETE 4 VIEW COMPARISON:  None Available. FINDINGS: No fracture or dislocation. Preserved joint spaces and bone mineralization. No joint effusion on lateral view. There is  dorsal soft tissue swelling about the olecranon with some soft tissue irregularity and lucency in this location. Please correlate for the exact location of soft tissue abnormality. No underlying bony erosive changes. If there is further concern of bone or soft tissue infection, additional cross-sectional study could be useful as clinically appropriate. IMPRESSION: Dorsal soft tissue swelling with soft tissue gas. Please correlate with exact location of soft tissue abnormality. No bony erosive changes or joint effusion seen by x-ray at this time Electronically Signed   By: Karen Kays M.D.   On: 12/27/2022 20:48    Pending Labs Unresulted Labs (From admission, onward)     Start     Ordered   12/28/22 0415  Blood culture (routine x 2)  BLOOD CULTURE X 2,   R (with STAT occurrences)     Question:  Release to patient  Answer:  Immediate   12/28/22 0414            Vitals/Pain Today's Vitals   12/28/22 0546 12/28/22 0600 12/28/22 0630 12/28/22 0723  BP: 104/62 117/68 (!) 105/57 93/61  Pulse: 75 80 72 76  Resp:   16 16  Temp:   98.7 F (37.1 C) 98.7 F (37.1 C)  TempSrc:   Oral Oral  SpO2: 100% 100% 100% 100%  Weight:      Height:      PainSc:    0-No pain    Isolation Precautions No active isolations  Medications Medications  vancomycin (VANCOREADY) IVPB 1500 mg/300 mL (0 mg Intravenous Stopped 12/28/22 0811)    Mobility walks     Focused Assessments Abscess to right elbow needing IV antibiotics and possible I&D in OR   R Recommendations: See Admitting Provider Note  Report given to:   Additional Notes: [id number]

## 2022-12-29 ENCOUNTER — Other Ambulatory Visit (HOSPITAL_COMMUNITY): Payer: Self-pay

## 2022-12-29 DIAGNOSIS — I429 Cardiomyopathy, unspecified: Secondary | ICD-10-CM

## 2022-12-29 LAB — COMPREHENSIVE METABOLIC PANEL
ALT: 22 U/L (ref 0–44)
AST: 19 U/L (ref 15–41)
Albumin: 2.8 g/dL — ABNORMAL LOW (ref 3.5–5.0)
Alkaline Phosphatase: 49 U/L (ref 38–126)
Anion gap: 6 (ref 5–15)
BUN: 12 mg/dL (ref 6–20)
CO2: 23 mmol/L (ref 22–32)
Calcium: 8.9 mg/dL (ref 8.9–10.3)
Chloride: 107 mmol/L (ref 98–111)
Creatinine, Ser: 0.7 mg/dL (ref 0.61–1.24)
GFR, Estimated: >60 mL/min (ref >60)
Glucose, Bld: 103 mg/dL — ABNORMAL HIGH (ref 70–99)
Potassium: 3.8 mmol/L (ref 3.5–5.1)
Sodium: 136 mmol/L (ref 135–145)
Total Bilirubin: 0.4 mg/dL (ref 0.3–1.2)
Total Protein: 6 g/dL — ABNORMAL LOW (ref 6.5–8.1)

## 2022-12-29 LAB — CBC
HCT: 41.3 % (ref 39.0–52.0)
Hemoglobin: 14.1 g/dL (ref 13.0–17.0)
MCH: 30.9 pg (ref 26.0–34.0)
MCHC: 34.1 g/dL (ref 30.0–36.0)
MCV: 90.4 fL (ref 80.0–100.0)
Platelets: 202 10*3/uL (ref 150–400)
RBC: 4.57 MIL/uL (ref 4.22–5.81)
RDW: 11.8 % (ref 11.5–15.5)
WBC: 5.6 10*3/uL (ref 4.0–10.5)
nRBC: 0 % (ref 0.0–0.2)

## 2022-12-29 NOTE — Care Management (Signed)
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 Quit smoking tobacco.Uninsured, noncompliant with previous follow up.  Presents with infected elbow. Is currently homeless, has a tent and sleeping bag.  Will need Medicaiton assistance and transportation upon DC Please send Dc meds to [facility] pharmacy TOC will follow for needs  Transition of Care Asessment: Insurance and Status: Selfpay Patient has primary care physician: No Home environment has been reviewed: Homeless, tent and sleeping bag Prior level of function:: Independent Prior/Current Home Services: No current home services Social Determinants of Health Reivew: SDOH reviewed needs interventions Readmission risk has been reviewed: Yes Transition of care needs: transition of care needs identified, TOC will continue to follow

## 2022-12-29 NOTE — Progress Notes (Signed)
[facility]                                                                               [name], is a 27 y.o. male, DOB - Jun 16, 1995, ZOX:[id number] Admit date - 12/27/2022    Outpatient Primary MD for the patient is Patient, No Pcp Per  LOS - 0  days    Brief summary   [name] is a 27 y.o. M with polysubstance abuse, depression, cardiomyopathy NOS who presented with few days progressive redness, swelling, fluctuance and pain of the right elbow.   Assessment & Plan    Assessment and Plan:   Purulent draining left elbow abscess Orthopedics consulted, recommended wet-to-dry dressings and discharge on broad-spectrum antibiotics and recommend outpatient follow-up with [name] in 1 week Pain control.     Nonischemic cardiomyopathy Repeat echocardiogram is pending    History of substance abuse/ Patient denies any IV drug abuse Currently on Topamax for withdrawals  Estimated body mass index is 21.81 kg/m as calculated from the following:   Height as of this encounter: 5\' 9"  (1.753 m).   Weight as of this encounter: 67 kg.  Code Status: full code.  DVT Prophylaxis:  enoxaparin (LOVENOX) injection 40 mg Start: 12/28/22 1400   Level of Care: Level of care: Med-Surg Family Communication: none at bedside.   Disposition Plan:     Remains inpatient appropriate:  IV antibiotics.   Procedures:  None.   Consultants:   Orthopedics.   Antimicrobials:   Anti-infectives (From admission, onward)    Start     Dose/Rate Route Frequency Ordered Stop   12/28/22 1800  vancomycin (VANCOREADY) IVPB 1250 mg/250 mL        1,250 mg 166.7 mL/hr over 90 Minutes Intravenous Every 12 hours 12/28/22 1001     12/28/22 0530  vancomycin (VANCOREADY) IVPB 1500 mg/300 mL        1,500 mg 150 mL/hr over 120 Minutes Intravenous  Once 12/28/22 0524 12/28/22 0811        Medications  Scheduled Meds:  enoxaparin (LOVENOX) injection  40 mg Subcutaneous Q24H    sodium chloride flush  3 mL Intravenous Q12H   Continuous Infusions:  vancomycin Stopped (12/29/22 0925)   PRN Meds:.acetaminophen **OR** acetaminophen, ketorolac, ondansetron **OR** ondansetron (ZOFRAN) IV    Subjective:   [name] was seen and examined today.  Pain controlled.   Objective:   Vitals:   12/28/22 1653 12/28/22 1915 12/29/22 0325 12/29/22 0749  BP: 106/60 (!) 96/52 98/64 95/65   Pulse: 65 88 62 68  Resp:  18 18 18   Temp: 98.6 F (37 C) 98.6 F (37 C) 98.2 F (36.8 C) 98.3 F (36.8 C)  TempSrc:  Oral Oral   SpO2: 100% 98% 98% 100%  Weight:      Height:        Intake/Output Summary (Last 24 hours) at 12/29/2022 1413 Last data filed at 12/29/2022 0925 Gross per 24 hour  Intake 850 ml  Output --  Net 850 ml   Filed Weights   12/28/22 0345  Weight: 67 kg     Exam General: Alert and oriented x 3, NAD Cardiovascular: S1  S2 auscultated, no murmurs, RRR Respiratory: Clear to auscultation bilaterally, no wheezing, rales or rhonchi Gastrointestinal: Soft, nontender, nondistended, + bowel sounds Ext: right elbow bandaged.  Neuro: AAOx3, Cr N's II- XII. Strength 5/5 upper and lower extremities bilaterally Skin: No rashes Psych: Normal affect and demeanor, alert and oriented x3    Data Reviewed:  I have personally reviewed following labs and imaging studies   CBC Lab Results  Component Value Date   WBC 5.6 12/29/2022   RBC 4.57 12/29/2022   HGB 14.1 12/29/2022   HCT 41.3 12/29/2022   MCV 90.4 12/29/2022   MCH 30.9 12/29/2022   PLT 202 12/29/2022   MCHC 34.1 12/29/2022   RDW 11.8 12/29/2022   LYMPHSABS 2.0 12/28/2022   MONOABS 1.2 (H) 12/28/2022   EOSABS 0.1 12/28/2022   BASOSABS 0.0 12/28/2022     Last metabolic panel Lab Results  Component Value Date   NA 136 12/29/2022   K 3.8 12/29/2022   CL 107 12/29/2022   CO2 23 12/29/2022   BUN 12 12/29/2022   CREATININE 0.70 12/29/2022   GLUCOSE 103 (H) 12/29/2022   GFRNONAA >60 12/29/2022    GFRAA >60 05/19/2018   CALCIUM 8.9 12/29/2022   PROT 6.0 (L) 12/29/2022   ALBUMIN 2.8 (L) 12/29/2022   BILITOT 0.4 12/29/2022   ALKPHOS 49 12/29/2022   AST 19 12/29/2022   ALT 22 12/29/2022   ANIONGAP 6 12/29/2022    CBG (last 3)  No results for input(s): "GLUCAP" in the last 72 hours.    Coagulation Profile: No results for input(s): "INR", "PROTIME" in the last 168 hours.   Radiology Studies: ECHOCARDIOGRAM COMPLETE  Result Date: 12/28/2022    ECHOCARDIOGRAM REPORT   Patient Name:   [name] Date of Exam: 12/28/2022 Medical Rec #:  [id number]    Height:       69.0 in Accession #:    [id number]   Weight:       147.7 lb Date of Birth:  05-14-1995     BSA:          1.816 m Patient Age:    27 years     BP:           93/61 mmHg Patient Gender: M            HR:           81 bpm. Exam Location:  Inpatient Procedure: 2D Echo, Cardiac Doppler and Color Doppler Indications:    Cardiomyopathy  History:        Patient has prior history of Echocardiogram examinations, most                 recent 02/05/2018. Cardiomegaly; Signs/Symptoms:Chest Pain.  Sonographer:    Melissa Morford RDCS (AE, PE) Referring Phys: [id number] ALLISON L ELLIS IMPRESSIONS  1. Left ventricular ejection fraction, by estimation, is 55 to 60%. The left ventricle has normal function. The left ventricle has no regional wall motion abnormalities. Left ventricular diastolic parameters were normal.  2. Right ventricular systolic function is normal. The right ventricular size is normal.  3. The mitral valve is normal in structure. No evidence of mitral valve regurgitation. No evidence of mitral stenosis. 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  4. The aortic valve is normal in structure. Aortic valve regurgitation is not visualized. No aortic stenosis is present.  5. The inferior vena cava is normal in size with greater  than 50% respiratory variability, suggesting right atrial  pressure of 3 mmHg. Comparison(s): Prior images reviewed side by side. The left ventricular function has improved. FINDINGS  Left Ventricle: Left ventricular ejection fraction, by estimation, is 55 to 60%. The left ventricle has normal function. The left ventricle has no regional wall motion abnormalities. The left ventricular internal cavity size was normal in size. There is  no left ventricular hypertrophy. Left ventricular diastolic parameters were normal. Right Ventricle: The right ventricular size is normal. No increase in right ventricular wall thickness. Right ventricular systolic function is normal. Left Atrium: Left atrial size was normal in size. Right Atrium: Right atrial size was normal in size. Pericardium: There is no evidence of pericardial effusion. Mitral Valve: The mitral valve is normal in structure. 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 No evidence of mitral valve regurgitation. No evidence of mitral valve stenosis. Tricuspid Valve: The tricuspid valve is normal in structure. Tricuspid valve regurgitation is trivial. No evidence of tricuspid stenosis. Aortic Valve: The aortic valve is normal in structure. Aortic valve regurgitation is not visualized. No aortic stenosis is present. Pulmonic Valve: The pulmonic valve was normal in structure. Pulmonic valve regurgitation is not visualized. No evidence of pulmonic stenosis. Aorta: The aortic root is normal in size and structure. Venous: The inferior vena cava is normal in size with greater than 50% respiratory variability, suggesting right atrial pressure of 3 mmHg. IAS/Shunts: No atrial level shunt detected by color flow Doppler.  LEFT VENTRICLE PLAX 2D LVIDd:         5.20 cm   Diastology LVIDs:         3.30 cm   LV e' medial:    11.40 cm/s LV PW:         0.80 cm   LV E/e' medial:  5.0 LV IVS:        0.60 cm   LV e' lateral:   12.80 cm/s LVOT diam:     2.20 cm   LV E/e' lateral: 4.5  LV SV:         69 LV SV Index:   38 LVOT Area:     3.80 cm  RIGHT VENTRICLE RV S prime:     11.70 cm/s TAPSE (M-mode): 2.5 cm LEFT ATRIUM             Index        RIGHT ATRIUM           Index LA diam:        2.60 cm 1.43 cm/m   RA Area:     11.60 cm LA Vol (A2C):   32.8 ml 18.06 ml/m  RA Volume:   27.10 ml  14.92 ml/m LA Vol (A4C):   25.7 ml 14.15 ml/m LA Biplane Vol: 30.1 ml 16.57 ml/m  AORTIC VALVE LVOT Vmax:   90.70 cm/s LVOT Vmean:  67.400 cm/s LVOT VTI:    0.181 m  AORTA Ao Root diam: 2.60 cm MITRAL VALVE               TRICUSPID VALVE MV Area (PHT): 3.70 cm    TR Peak grad:   20.1 mmHg MV Decel Time: 205 msec    TR Vmax:        224.00 cm/s MV E velocity: 57.50 cm/s MV A velocity: 37.20 cm/s  SHUNTS MV E/A ratio:  1.55        Systemic VTI:  0.18  m                            Systemic Diam: 2.20 cm Thurmon Fair MD Electronically signed by Thurmon Fair MD Signature Date/Time: 12/28/2022/2:31:41 PM    Final    DG Elbow Complete Right  Result Date: 12/27/2022 CLINICAL DATA:  Swelling.  Abscess with draining wound. EXAM: RIGHT ELBOW - COMPLETE 4 VIEW COMPARISON:  None Available. FINDINGS: No fracture or dislocation. Preserved joint spaces and bone mineralization. No joint effusion on lateral view. There is dorsal soft tissue swelling about the olecranon with some soft tissue irregularity and lucency in this location. Please correlate for the exact location of soft tissue abnormality. No underlying bony erosive changes. If there is further concern of bone or soft tissue infection, additional cross-sectional study could be useful as clinically appropriate. IMPRESSION: Dorsal soft tissue swelling with soft tissue gas. Please correlate with exact location of soft tissue abnormality. No bony erosive changes or joint effusion seen by x-ray at this time Electronically Signed   By: Karen Kays M.D.   On: 12/27/2022 20:48       Kathlen Mody M.D. [facility] 12/29/2022, 2:13 PM  Available via Epic  secure chat 7am-7pm After 7 pm, please refer to night coverage provider listed on amion.

## 2022-12-29 NOTE — Discharge Instructions (Addendum)
Intensive Outpatient Programs   [facility]                                  [facility] [address]                                                       [address] [place],  [place]                                                           [place], [place] [phone]                                                              [phone]   [facility]                [facility]         (Inpatient and outpatient)                                           [phone] (Suboxone and Methadone) [address]                                                                                                                 [phone]                                                                                                     [facility]                                               [facility] - Intensive Outpatient [address]  [address] [place], [place] [address]                                                 [place], [place]  [phone]                                                              [phone]   [facility] (Outpatient, Inpatient, Chemical       [facility] (Groups and Residental) (insurance only) [phone]                                              [place], [place]                                                                                                             [phone]                                                    [facility]                                       [facility] (for caregivers and family) [address]                                                    [address] [place], [place]                                                          [place], [place] [phone]                                                               [phone]   Residential Treatment Programs   [facility]  [place](2 years) Residential: 90 days)                 [facility] ([facility] [address] [facility] [place]                                                            [address] [place], [place]                                                    [place], [place] [phone]                                                              [phone] or [phone]   [facility]                                              [facility] [address]                                                               [address] [place], [place]                                                          [place], [place] [phone]                                                              [phone]   [facility]                                [facility] (RTS) [address]                                                           [address] [place], [place] [address]  [place], [place] [phone]                                                              [phone] Admissions: 8am-3pm M-F   [facility]: Residential Program [phone] Days)                     [facility]: [facility]  [place], [place]                                                    [place], [place]  [phone] or [phone]                                             (Walk in Hours over the weekend or by referral)     [facility] Crisis: [facility]:[phone] (for crisis response 24 hours a day)     Last Modified by Carley Hammed, LCSWA at 12/26/20 1447   Housing resources DAY Merchandiser, retail [facility]) Monday - Friday 8am - 3pm          Sat & Sun 8am - 2pm [address] [place], [place] [address]   [phone]      www.interactiveresourcecenter.org IRC offers among other critical resources: showers, laundry, barbershop, phone bank, mailroom, computer lab, medical clinic, gardens and a bike maintenance area.   AREA SHELTERS  [place] [employer]  (Men & women) [address] [place] [phone]  [facility] (Men/women/families) [address] [place] [phone]   [facility] (Families with children) [phone] [address]  [place] [phone]   [facility] (Domestic Violence Shelter) [address]  [place] [phone]   [facility] (Children ages 34-17) [address]  [place] [phone]   [facility]   (Women & children) [address] [place] [phone]   [facility] (Women/substance abuse) [address]  [place] [phone]   [facility] (Men) [address]  [place] [phone]   [facility] (Men) [address]  [place] [phone]  [employer] (Women) [phone] [address]  [place] [phone]   [facility] (Single women & women with children) [address]  [place] [phone]  [facility] (Men/women/families) [address] [phone]    [facility]   (Domestic Violence shelter) [address]   [phone]   [facility] (Men & women) [address]  [place] [phone]  [facility] (Men) [address]  [employer] [phone]   [facility] (Men) [address]  [place] [phone]   Holiday representative (Single women & families) [address]  [place] [phone]  Crisis [facility]. (Men/women & families) [address]  [place] [phone]    If you are at risk of losing your housing (throughout [facility]) call the Housing Hotline at [phone]. You may also  contact 2-1-1, a FREE service of the [employer] that provides  information about many resources including housing. Dial 211, or visit online at PooledIncome.pl. [facility]:  [facility], contact Janelle Floor [phone] (men only)                          [facility]uey Romans in [place]:  90 day homeless program for women and men;                             contact Rev. Chambers [phone]  [facility]:  [facility]:  men/women/children [phone]  [facility]:  Shelter in [place], [place] [phone]                       [facility] in [place], [place] [phone]   [facility]:  Crisis Council for Abused Women, [phone]; (women and children)  [facility]:  [employer], men/women/children; [phone]                           [facility] in [place], [phone]; substance abuse halfway house for men              [facility]; 4 bedroom house in [place] for homeless women, contact Jenne Campus [phone]               [facility] in [place], [phone] (women and children)               [facility], for abused women and children, 24 hour crisis line, [phone], Jamison Oka  [facility], halfway house for women, [place], [phone]  [facility]:  [facility], [phone]; open Mon-Thurs from YBO17 - March 15 when temp is below 32 degrees                              [facility]; Alwyn Pe[place], [phone]; cell [phone]; open 24/7  [facility]:  Outreach for [place] - Rev Ladona Ridgel - [phone]  [place]:  transitional housing for women and children; [place] Resides [phone]

## 2022-12-29 NOTE — Progress Notes (Signed)
Patient ID: [name], male   DOB: 09-Sep-1995, 27 y.o.   MRN: [id number]   LOS: 0 days   Subjective: Pt feeling no worse.    Objective: Vital signs in last 24 hours: Temp:  [98.2 F (36.8 C)-98.8 F (37.1 C)] 98.3 F (36.8 C) (09/19 0749) Pulse Rate:  [62-88] 68 (09/19 0749) Resp:  [16-18] 18 (09/19 0749) BP: (95-131)/(52-82) 95/65 (09/19 0749) SpO2:  [98 %-100 %] 100 % (09/19 0749) Last BM Date : 12/28/22   Laboratory  CBC Recent Labs    12/28/22 0430 12/29/22 0456  WBC 9.0 5.6  HGB 14.1 14.1  HCT 42.2 41.3  PLT PLATELET CLUMPS NOTED ON SMEAR, COUNT APPEARS DECREASED 202   BMET Recent Labs    12/28/22 0430 12/29/22 0456  NA 138 136  K 3.6 3.8  CL 101 107  CO2 25 23  GLUCOSE 87 103*  BUN 14 12  CREATININE 0.81 0.70  CALCIUM 9.0 8.9     Physical Exam General appearance: alert and no distress Right elbow -- Improved appearance. Able to extrude and remove bursa through ulceration. Wound bed appears healthy.   Assessment/Plan: Right olecranon bursitis -- Wet-to-dry dressings daily. Ok to wash in shower. Ok for discharge on broad spectrum abx. F/u with Dr. Odis Hollingshead in 1 week.    Freeman Caldron, PA-C Orthopedic Surgery [phone] 12/29/2022

## 2022-12-29 NOTE — Plan of Care (Addendum)
Goal: Ability to manage health-related needs will improve Outcome: Progressing  Pt admitted into the hospital for infected right elbow abscess with purulent drainage.  MD possible thinks it cellulitis or olecranon bursitis.  He is on IV abx per MD's orders.    Problem: Clinical Measurements: Goal: Will remain free from infection Outcome: Progressing S/Sx of infection monitored and assessed q-shift.  Pt has remained afebrile thus far.  He is on IV abx per MD's orders.  Pt admitted into the hospital for infected right elbow abscess with purulent drainage.  MD possible thinks it cellulitis or olecranon bursitis.  He is on IV abx per MD's orders.    Problem: Clinical Measurements: Goal: Respiratory complications will improve Outcome: Progressing Respiratory status monitored and assessed q-shift.  Pt is on room air with PO2 at 98-100% and respiration rate of 18 breaths per minute.  Pt has not endorsed c/o SOB or DOE.    Problem: Activity: Goal: Risk for activity intolerance will decrease Outcome: Progressing Pt is independent of all his ADLs.  He was observed OOB ambulating in his room with a steady gait.   Problem: Nutrition: Goal: Adequate nutrition will be maintained Outcome: Progressing Pt is on a regular diet per MD's orders.  He has been able to tolerate his diet w/o s/sx of n/v or abdominal pain/ distention.    Problem: Coping: Goal: Level of anxiety will decrease Outcome: Progressing Pt has not endorsed c/o anxiety thus far.       Problem: Clinical Measurements: Goal: Cardiovascular complication will be avoided Outcome: Progressing Pt VS WNL thus far except for his HR which was bradycardic but has improved.    Problem: Elimination: Goal: Will not experience complications related to bowel motility Outcome: Progressing Pt is continent of both bowel and bladder.  He has not endorsed c/o constipation, dysuria or abdominal pain/ distention.          Problem: Pain  Managment: Goal: General experience of comfort will improve Outcome: Progressing Pt has denied pain thus far.   Problem: Safety: Goal: Ability to remain free from injury will improve Outcome: Progressing Pt has remained free from falls thus far.  Instructed pt to utilize RN call light for assistance.  Hourly rounds performed.  Bed locked, in lowest position, with two upper side rails engaged.  Belongings and call light within reach.  Pt was observed OOB ambulating in his room with a steady gait.  Nonskid sock implemented.   Problem: Skin Integrity: Goal: Risk for impaired skin integrity will decrease Outcome: Progressing Skin integrity monitored and assessed q-shift.  Instructed pt to turn q2 hours to prevent further skin impairment.  Tubes and drains assessed for device related pressure sores.  Pt is continent of both bowel and bladder.  Dressing changes performed per MD's orders.

## 2022-12-29 NOTE — Care Management (Signed)
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 Quit smoking tobacco.Uninsured, noncompliant with previous follow up.  Presents with infected elbow. Is currently homeless, has a tent and sleeping bag.  Will need Medic[employer] assistance and transportation upon DC SA resources placed on AVS. Please send [facility] to [facility] pharmacy TOC will follow for needs  Transition of Care Asessment: Insurance and Status: Selfpay Patient has primary care physician: No Home environment has been reviewed: Homeless, tent and sleeping bag Prior level of function:: Independent Prior/Current Home Services: No current home services Social Determinants of Health Reivew: SDOH reviewed needs interventions Readmission risk has been reviewed: Yes Transition of care needs: transition of care needs identified, TOC will continue to follow

## 2022-12-30 LAB — MRSA NEXT GEN BY PCR, NASAL: MRSA by PCR Next Gen: DETECTED — AB

## 2022-12-30 MED ORDER — ENSURE ENLIVE PO LIQD
237.0000 mL | Freq: Two times a day (BID) | ORAL | Status: DC
  Administered 2022-12-31 – 2023-01-01 (×4): 237 mL via ORAL

## 2022-12-30 NOTE — TOC Progression Note (Signed)
Transition of Care [facility]) - Progression Note    Patient Details  Name: [name] MRN: [id number] Date of Birth: [birth year]  Transition of Care [facility]) CM/SW Contact  Arlen Dupuis A [place], [place] Phone Number: 12/30/2022, 4:05 PM  Clinical Narrative:      CSW met with pt about [facility] resources and provided contact information to facilities in [place], [place] and [place] area.   No other needs identified at this time. TOC will sign off, please consult again if TOC needs arise.        Expected Discharge Plan and Services                                               Social Determinants of Health (SDOH) Interventions SDOH Screenings   Food Insecurity: Food Insecurity Present (12/28/2022)  Housing: High Risk (12/28/2022)  Transportation Needs: Unmet Transportation Needs (12/28/2022)  Utilities: At Risk (12/28/2022)  Social Connections: Unknown (08/24/2021)   Received from [facility]  Tobacco Use: Medium Risk (12/28/2022)    Readmission Risk Interventions     No data to display

## 2022-12-30 NOTE — Progress Notes (Signed)
Patient given printout with list of housing resources per case manager. Bed lowered and call bell within reach.

## 2022-12-30 NOTE — Care Management (Signed)
SA and housing resources added to AVS.

## 2022-12-30 NOTE — Progress Notes (Signed)
[facility]                                                                               [name], is a 27 y.o. male, DOB - 11-Feb-1996, ZOX:[id number] Admit date - 12/27/2022    Outpatient Primary MD for the [name] is [name], No Pcp Per  LOS - 0  days    Brief summary   [name] is a 27 y.o. M with polysubstance abuse, depression, cardiomyopathy NOS who presented with few days progressive redness, swelling, fluctuance and pain of the right elbow.   Assessment & Plan    Assessment and Plan:   Purulent draining left elbow abscess Still has significant drainage from the elbow wound.  Orthopedics consulted, recommended wet-to-dry dressings and discharge on broad-spectrum antibiotics and recommend outpatient follow-up with Dr. Odis Hollingshead in 1 week Pain control.     Nonischemic cardiomyopathy Repeat echocardiogram is pending    History of substance abuse/ [name] denies any IV drug abuse Currently on Topamax for withdrawals   [name] requesting to speak to the Social work regarding the disposition, requesting referral to [facility].   Estimated body mass index is 21.81 kg/m as calculated from the following:   Height as of this encounter: 5\' 9"  (1.753 m).   Weight as of this encounter: 67 kg.  Code Status: full code.  DVT Prophylaxis:  enoxaparin (LOVENOX) injection 40 mg Start: 12/28/22 1400   Level of Care: Level of care: Med-Surg Family Communication: none at bedside.   Disposition Plan:     Remains inpatient appropriate:  IV antibiotics.   Procedures:  None.   Consultants:   Orthopedics.   Antimicrobials:   Anti-infectives (From admission, onward)    Start     Dose/Rate Route Frequency Ordered Stop   12/28/22 1800  vancomycin (VANCOREADY) IVPB 1250 mg/250 mL        1,250 mg 166.7 mL/hr over 90 Minutes Intravenous Every 12 hours 12/28/22 1001     12/28/22 0530  vancomycin (VANCOREADY) IVPB 1500 mg/300 mL        1,500 mg 150 mL/hr over  120 Minutes Intravenous  Once 12/28/22 0524 12/28/22 0811        Medications  Scheduled Meds:  enoxaparin (LOVENOX) injection  40 mg Subcutaneous Q24H   sodium chloride flush  3 mL Intravenous Q12H   Continuous Infusions:  vancomycin 1,250 mg (12/30/22 0623)   PRN Meds:.acetaminophen **OR** acetaminophen, ketorolac, ondansetron **OR** ondansetron (ZOFRAN) IV    Subjective:   [name] was seen and examined today. Still has lot of pain in the elbow and drainage from the elbow.   Objective:   Vitals:   12/29/22 1620 12/29/22 2100 12/30/22 0547 12/30/22 0749  BP: (!) 142/123 136/89 (!) 87/58 (!) 108/55  Pulse: 80 78 69 69  Resp:  17 (!) 21 16  Temp: 98 F (36.7 C) 98 F (36.7 C) 97.8 F (36.6 C) 97.8 F (36.6 C)  TempSrc:  Oral  Oral  SpO2: 99%  98% 100%  Weight:      Height:       No intake or output data in the 24 hours ending 12/30/22 1035  Filed Weights  12/28/22 0345  Weight: 67 kg     Exam General exam: Appears calm and comfortable  Respiratory system: Clear to auscultation. Respiratory effort normal. Cardiovascular system: S1 & S2 heard, RRR. No JVD,  Gastrointestinal system: Abdomen is nondistended, soft and nontender.  Central nervous system: Alert and oriented. No focal neurological deficits. Extremities: right elbow bandaged soaked with drainage from the wounds.  Skin: No rashes, lesions or ulcers Psychiatry:  Mood & affect appropriate.    Data Reviewed:  I have personally reviewed following labs and imaging studies   CBC Lab Results  Component Value Date   WBC 5.6 12/29/2022   RBC 4.57 12/29/2022   HGB 14.1 12/29/2022   HCT 41.3 12/29/2022   MCV 90.4 12/29/2022   MCH 30.9 12/29/2022   PLT 202 12/29/2022   MCHC 34.1 12/29/2022   RDW 11.8 12/29/2022   LYMPHSABS 2.0 12/28/2022   MONOABS 1.2 (H) 12/28/2022   EOSABS 0.1 12/28/2022   BASOSABS 0.0 12/28/2022     Last metabolic panel Lab Results  Component Value Date   NA 136  12/29/2022   K 3.8 12/29/2022   CL 107 12/29/2022   CO2 23 12/29/2022   BUN 12 12/29/2022   CREATININE 0.70 12/29/2022   GLUCOSE 103 (H) 12/29/2022   GFRNONAA >60 12/29/2022   GFRAA >60 05/19/2018   CALCIUM 8.9 12/29/2022   PROT 6.0 (L) 12/29/2022   ALBUMIN 2.8 (L) 12/29/2022   BILITOT 0.4 12/29/2022   ALKPHOS 49 12/29/2022   AST 19 12/29/2022   ALT 22 12/29/2022   ANIONGAP 6 12/29/2022    CBG (last 3)  No results for input(s): "GLUCAP" in the last 72 hours.    Coagulation Profile: No results for input(s): "INR", "PROTIME" in the last 168 hours.   Radiology Studies: ECHOCARDIOGRAM COMPLETE  Result Date: 12/28/2022    ECHOCARDIOGRAM REPORT   [name] Name:   [name] Date of Exam: 12/28/2022 Medical Rec #:  [id number]    Height:       69.0 in Accession #:    [id number]   Weight:       147.7 lb Date of Birth:  03/28/1996     BSA:          1.816 m [name] Age:    27 years     BP:           93/61 mmHg [name] Gender: M            HR:           81 bpm. Exam Location:  Inpatient Procedure: 2D Echo, Cardiac Doppler and Color Doppler Indications:    Cardiomyopathy  History:        [name] has prior history of Echocardiogram examinations, most                 recent 02/05/2018. Cardiomegaly; Signs/Symptoms:Chest Pain.  Sonographer:    Melissa Morford RDCS (AE, PE) Referring Phys: [id number] ALLISON L ELLIS IMPRESSIONS  1. Left ventricular ejection fraction, by estimation, is 55 to 60%. The left ventricle has normal function. The left ventricle has no regional wall motion abnormalities. Left ventricular diastolic parameters were normal.  2. Right ventricular systolic function is normal. The right ventricular size is normal.  3. The mitral valve is normal in structure. No evidence of mitral valve regurgitation. No evidence of mitral stenosis. 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  4. The aortic valve is  [facility]                                                                               [name], is a 27 y.o. male, DOB - 11-Feb-1996, ZOX:[id number] Admit date - 12/27/2022    Outpatient Primary MD for the [name] is [name], No Pcp Per  LOS - 0  days    Brief summary   [name] is a 27 y.o. M with polysubstance abuse, depression, cardiomyopathy NOS who presented with few days progressive redness, swelling, fluctuance and pain of the right elbow.   Assessment & Plan    Assessment and Plan:   Purulent draining left elbow abscess Still has significant drainage from the elbow wound.  Orthopedics consulted, recommended wet-to-dry dressings and discharge on broad-spectrum antibiotics and recommend outpatient follow-up with Dr. Odis Hollingshead in 1 week Pain control.     Nonischemic cardiomyopathy Repeat echocardiogram is pending    History of substance abuse/ [name] denies any IV drug abuse Currently on Topamax for withdrawals   [name] requesting to speak to the Social work regarding the disposition, requesting referral to [facility].   Estimated body mass index is 21.81 kg/m as calculated from the following:   Height as of this encounter: 5\' 9"  (1.753 m).   Weight as of this encounter: 67 kg.  Code Status: full code.  DVT Prophylaxis:  enoxaparin (LOVENOX) injection 40 mg Start: 12/28/22 1400   Level of Care: Level of care: Med-Surg Family Communication: none at bedside.   Disposition Plan:     Remains inpatient appropriate:  IV antibiotics.   Procedures:  None.   Consultants:   Orthopedics.   Antimicrobials:   Anti-infectives (From admission, onward)    Start     Dose/Rate Route Frequency Ordered Stop   12/28/22 1800  vancomycin (VANCOREADY) IVPB 1250 mg/250 mL        1,250 mg 166.7 mL/hr over 90 Minutes Intravenous Every 12 hours 12/28/22 1001     12/28/22 0530  vancomycin (VANCOREADY) IVPB 1500 mg/300 mL        1,500 mg 150 mL/hr over  120 Minutes Intravenous  Once 12/28/22 0524 12/28/22 0811        Medications  Scheduled Meds:  enoxaparin (LOVENOX) injection  40 mg Subcutaneous Q24H   sodium chloride flush  3 mL Intravenous Q12H   Continuous Infusions:  vancomycin 1,250 mg (12/30/22 0623)   PRN Meds:.acetaminophen **OR** acetaminophen, ketorolac, ondansetron **OR** ondansetron (ZOFRAN) IV    Subjective:   [name] was seen and examined today. Still has lot of pain in the elbow and drainage from the elbow.   Objective:   Vitals:   12/29/22 1620 12/29/22 2100 12/30/22 0547 12/30/22 0749  BP: (!) 142/123 136/89 (!) 87/58 (!) 108/55  Pulse: 80 78 69 69  Resp:  17 (!) 21 16  Temp: 98 F (36.7 C) 98 F (36.7 C) 97.8 F (36.6 C) 97.8 F (36.6 C)  TempSrc:  Oral  Oral  SpO2: 99%  98% 100%  Weight:      Height:       No intake or output data in the 24 hours ending 12/30/22 1035  Filed Weights  [facility]                                                                               [name], is a 27 y.o. male, DOB - 11-Feb-1996, ZOX:[id number] Admit date - 12/27/2022    Outpatient Primary MD for the [name] is [name], No Pcp Per  LOS - 0  days    Brief summary   [name] is a 27 y.o. M with polysubstance abuse, depression, cardiomyopathy NOS who presented with few days progressive redness, swelling, fluctuance and pain of the right elbow.   Assessment & Plan    Assessment and Plan:   Purulent draining left elbow abscess Still has significant drainage from the elbow wound.  Orthopedics consulted, recommended wet-to-dry dressings and discharge on broad-spectrum antibiotics and recommend outpatient follow-up with Dr. Odis Hollingshead in 1 week Pain control.     Nonischemic cardiomyopathy Repeat echocardiogram is pending    History of substance abuse/ [name] denies any IV drug abuse Currently on Topamax for withdrawals   [name] requesting to speak to the Social work regarding the disposition, requesting referral to [facility].   Estimated body mass index is 21.81 kg/m as calculated from the following:   Height as of this encounter: 5\' 9"  (1.753 m).   Weight as of this encounter: 67 kg.  Code Status: full code.  DVT Prophylaxis:  enoxaparin (LOVENOX) injection 40 mg Start: 12/28/22 1400   Level of Care: Level of care: Med-Surg Family Communication: none at bedside.   Disposition Plan:     Remains inpatient appropriate:  IV antibiotics.   Procedures:  None.   Consultants:   Orthopedics.   Antimicrobials:   Anti-infectives (From admission, onward)    Start     Dose/Rate Route Frequency Ordered Stop   12/28/22 1800  vancomycin (VANCOREADY) IVPB 1250 mg/250 mL        1,250 mg 166.7 mL/hr over 90 Minutes Intravenous Every 12 hours 12/28/22 1001     12/28/22 0530  vancomycin (VANCOREADY) IVPB 1500 mg/300 mL        1,500 mg 150 mL/hr over  120 Minutes Intravenous  Once 12/28/22 0524 12/28/22 0811        Medications  Scheduled Meds:  enoxaparin (LOVENOX) injection  40 mg Subcutaneous Q24H   sodium chloride flush  3 mL Intravenous Q12H   Continuous Infusions:  vancomycin 1,250 mg (12/30/22 0623)   PRN Meds:.acetaminophen **OR** acetaminophen, ketorolac, ondansetron **OR** ondansetron (ZOFRAN) IV    Subjective:   [name] was seen and examined today. Still has lot of pain in the elbow and drainage from the elbow.   Objective:   Vitals:   12/29/22 1620 12/29/22 2100 12/30/22 0547 12/30/22 0749  BP: (!) 142/123 136/89 (!) 87/58 (!) 108/55  Pulse: 80 78 69 69  Resp:  17 (!) 21 16  Temp: 98 F (36.7 C) 98 F (36.7 C) 97.8 F (36.6 C) 97.8 F (36.6 C)  TempSrc:  Oral  Oral  SpO2: 99%  98% 100%  Weight:      Height:       No intake or output data in the 24 hours ending 12/30/22 1035  Filed Weights

## 2022-12-30 NOTE — Plan of Care (Signed)
Problem: Education: Goal: Knowledge of General Education information will improve Description: Including pain rating scale, medication(s)/side effects and non-pharmacologic comfort measures Outcome: Progressing   Problem: Coping: Goal: Level of anxiety will decrease Outcome: Progressing   Problem: Pain Managment: Goal: General experience of comfort will improve Outcome: Progressing

## 2022-12-31 NOTE — Progress Notes (Signed)
[facility]                                                                               [name], is a 27 y.o. male, DOB - 1995-12-16, HQI:[id number] Admit date - 12/27/2022    Outpatient Primary MD for the [name] is [name], No Pcp Per  LOS - 1  days    Brief summary   [name] is a 27 y.o. M with polysubstance abuse, depression, cardiomyopathy NOS who presented with few days progressive redness, swelling, fluctuance and pain of the right elbow.   Assessment & Plan    Assessment and Plan:   Purulent draining left elbow abscess, MRSA positive.  Still has significant drainage from the elbow wound.  Orthopedics consulted, recommended wet-to-dry dressings and discharge on broad-spectrum antibiotics and recommend outpatient follow-up with Dr. Odis Hollingshead in 1 week. Will transition to oral linezolid in am on discharge.  Pain control.     Nonischemic cardiomyopathy Echocardiogram showed preserved LVEF. No regional wall abn.     History of substance abuse/ [name] denies any IV drug abuse Currently on Topamax for withdrawals   [name] calling family to make arrangements to stay with them, he said he called his sister and mother and waiting to hear from them, once arrangements are made, will plan for discharge.   Estimated body mass index is 21.81 kg/m as calculated from the following:   Height as of this encounter: 5\' 9"  (1.753 m).   Weight as of this encounter: 67 kg.  Code Status: full code.  DVT Prophylaxis:  enoxaparin (LOVENOX) injection 40 mg Start: 12/28/22 1400   Level of Care: Level of care: Med-Surg Family Communication: none at bedside.   Disposition Plan:     Remains inpatient appropriate:  IV antibiotics.   Procedures:  None.   Consultants:   Orthopedics.   Antimicrobials:   Anti-infectives (From admission, onward)    Start     Dose/Rate Route Frequency Ordered Stop   12/28/22 1800  vancomycin (VANCOREADY) IVPB 1250 mg/250 mL         1,250 mg 166.7 mL/hr over 90 Minutes Intravenous Every 12 hours 12/28/22 1001     12/28/22 0530  vancomycin (VANCOREADY) IVPB 1500 mg/300 mL        1,500 mg 150 mL/hr over 120 Minutes Intravenous  Once 12/28/22 0524 12/28/22 0811        Medications  Scheduled Meds:  enoxaparin (LOVENOX) injection  40 mg Subcutaneous Q24H   feeding supplement  237 mL Oral BID BM   sodium chloride flush  3 mL Intravenous Q12H   Continuous Infusions:  vancomycin 1,250 mg (12/31/22 0514)   PRN Meds:.acetaminophen **OR** acetaminophen, ketorolac, ondansetron **OR** ondansetron (ZOFRAN) IV    Subjective:   Tracey Witteman was seen and examined today.  Draining is improving.   Objective:   Vitals:   12/30/22 1554 12/30/22 2013 12/31/22 0455 12/31/22 0746  BP: 111/64 102/63 96/65 95/63   Pulse: 74 66 (!) 58 61  Resp: 16 18 18 17   Temp: 98.3 F (36.8 C) 99 F (37.2 C) 97.7 F (36.5 C) 98.3 F (36.8 C)  TempSrc: Oral Oral Oral Oral  SpO2:  95% 95% 98% 99%  Weight:      Height:       No intake or output data in the 24 hours ending 12/31/22 1434  Filed Weights   12/28/22 0345  Weight: 67 kg     Exam General exam: Appears calm and comfortable  Respiratory system: Clear to auscultation. Respiratory effort normal. Cardiovascular system: S1 & S2 heard, RRR. No JVD, No pedal edema. Gastrointestinal system: Abdomen is nondistended, soft and nontender.  Central nervous system: Alert and oriented. No focal neurological deficits. Extremities: right elbow bandaged. Skin: No rashes, Psychiatry:  Mood & affect appropriate.     Data Reviewed:  I have personally reviewed following labs and imaging studies   CBC Lab Results  Component Value Date   WBC 5.6 12/29/2022   RBC 4.57 12/29/2022   HGB 14.1 12/29/2022   HCT 41.3 12/29/2022   MCV 90.4 12/29/2022   MCH 30.9 12/29/2022   PLT 202 12/29/2022   MCHC 34.1 12/29/2022   RDW 11.8 12/29/2022   LYMPHSABS 2.0 12/28/2022   MONOABS 1.2  (H) 12/28/2022   EOSABS 0.1 12/28/2022   BASOSABS 0.0 12/28/2022     Last metabolic panel Lab Results  Component Value Date   NA 136 12/29/2022   K 3.8 12/29/2022   CL 107 12/29/2022   CO2 23 12/29/2022   BUN 12 12/29/2022   CREATININE 0.70 12/29/2022   GLUCOSE 103 (H) 12/29/2022   GFRNONAA >60 12/29/2022   GFRAA >60 05/19/2018   CALCIUM 8.9 12/29/2022   PROT 6.0 (L) 12/29/2022   ALBUMIN 2.8 (L) 12/29/2022   BILITOT 0.4 12/29/2022   ALKPHOS 49 12/29/2022   AST 19 12/29/2022   ALT 22 12/29/2022   ANIONGAP 6 12/29/2022    CBG (last 3)  No results for input(s): "GLUCAP" in the last 72 hours.    Coagulation Profile: No results for input(s): "INR", "PROTIME" in the last 168 hours.   Radiology Studies: No results found.     Kathlen Mody M.D. [facility] 12/31/2022, 2:34 PM  Available via Epic secure chat 7am-7pm After 7 pm, please refer to night coverage provider listed on amion.

## 2022-12-31 NOTE — Plan of Care (Signed)
Patient alert/oriented X4. Patient compliant with medication administration and dressing changed on R elbow. Patient states he has difficulty keeping up with his dressing changes when he is discharged and is afraid of his wound getting reinfected. Patient VSS, no complaints at this time.   Problem: Education: Goal: Knowledge of General Education information will improve Description: Including pain rating scale, medication(s)/side effects and non-pharmacologic comfort measures Outcome: Progressing   Problem: Health Behavior/Discharge Planning: Goal: Ability to manage health-related needs will improve Outcome: Progressing   Problem: Clinical Measurements: Goal: Ability to maintain clinical measurements within normal limits will improve Outcome: Progressing   Problem: Clinical Measurements: Goal: Will remain free from infection Outcome: Progressing   Problem: Clinical Measurements: Goal: Diagnostic test results will improve Outcome: Progressing   Problem: Clinical Measurements: Goal: Respiratory complications will improve Outcome: Progressing   Problem: Clinical Measurements: Goal: Cardiovascular complication will be avoided Outcome: Progressing   Problem: Activity: Goal: Risk for activity intolerance will decrease Outcome: Progressing   Problem: Nutrition: Goal: Adequate nutrition will be maintained Outcome: Progressing   Problem: Coping: Goal: Level of anxiety will decrease Outcome: Progressing   Problem: Elimination: Goal: Will not experience complications related to bowel motility Outcome: Progressing   Problem: Elimination: Goal: Will not experience complications related to urinary retention Outcome: Progressing   Problem: Pain Managment: Goal: General experience of comfort will improve Outcome: Progressing   Problem: Safety: Goal: Ability to remain free from injury will improve Outcome: Progressing   Problem: Skin Integrity: Goal: Risk for impaired skin  integrity will decrease Outcome: Progressing

## 2023-01-01 NOTE — Plan of Care (Signed)
Patient alert/oriented X4. Patient complaint with medication administration and dressing changed on R elbow. Patient expresses concerns regarding assistance with dressing changes. Patient tolerated IV abx. VSS.   Problem: Education: Goal: Knowledge of General Education information will improve Description: Including pain rating scale, medication(s)/side effects and non-pharmacologic comfort measures Outcome: Progressing   Problem: Health Behavior/Discharge Planning: Goal: Ability to manage health-related needs will improve Outcome: Progressing   Problem: Clinical Measurements: Goal: Ability to maintain clinical measurements within normal limits will improve Outcome: Progressing   Problem: Clinical Measurements: Goal: Will remain free from infection Outcome: Progressing   Problem: Clinical Measurements: Goal: Diagnostic test results will improve Outcome: Progressing   Problem: Clinical Measurements: Goal: Respiratory complications will improve Outcome: Progressing   Problem: Clinical Measurements: Goal: Cardiovascular complication will be avoided Outcome: Progressing   Problem: Activity: Goal: Risk for activity intolerance will decrease Outcome: Progressing   Problem: Nutrition: Goal: Adequate nutrition will be maintained Outcome: Progressing   Problem: Coping: Goal: Level of anxiety will decrease Outcome: Progressing   Problem: Elimination: Goal: Will not experience complications related to bowel motility Outcome: Progressing   Problem: Elimination: Goal: Will not experience complications related to urinary retention Outcome: Progressing   Problem: Pain Managment: Goal: General experience of comfort will improve Outcome: Progressing   Problem: Safety: Goal: Ability to remain free from injury will improve Outcome: Progressing   Problem: Skin Integrity: Goal: Risk for impaired skin integrity will decrease Outcome: Progressing

## 2023-01-01 NOTE — Progress Notes (Addendum)
[facility]                                                                               [name], is a 27 y.o. male, DOB - Sep 24, 1995, ZOX:[id number] Admit date - 12/27/2022    Outpatient Primary MD for the patient is Patient, No Pcp Per  LOS - 2  days    Brief summary   [name] is a 27 y.o. M with polysubstance abuse, depression, cardiomyopathy NOS who presented with few days progressive redness, swelling, fluctuance and pain of the right elbow.   Assessment & Plan    Assessment and Plan:   Purulent draining left elbow abscess, MRSA positive.  Still has significant drainage from the elbow wound.  Orthopedics consulted, recommended wet-to-dry dressings and discharge on broad-spectrum antibiotics and recommend outpatient follow-up with Dr. Odis Hollingshead in 1 week. Will transition to oral linezolid on discharge. Waiting for safe disposition.  Pain control.     Nonischemic cardiomyopathy Echocardiogram showed preserved LVEF. No regional wall abn.     History of substance abuse/ Patient denies any IV drug abuse Currently on Topamax for withdrawals   Patient calling family to make arrangements to stay with them, he said he called his sister and mother and waiting to hear from them, once arrangements are made, will plan for discharge.   Estimated body mass index is 21.81 kg/m as calculated from the following:   Height as of this encounter: 5\' 9"  (1.753 m).   Weight as of this encounter: 67 kg.  Code Status: full code.  DVT Prophylaxis:  enoxaparin (LOVENOX) injection 40 mg Start: 12/28/22 1400   Level of Care: Level of care: Med-Surg Family Communication: none at bedside.   Disposition Plan:     Remains inpatient appropriate:  IV antibiotics.   Procedures:  None.   Consultants:   Orthopedics.   Antimicrobials:   Anti-infectives (From admission, onward)    Start     Dose/Rate Route Frequency Ordered Stop   12/28/22 1800  vancomycin  (VANCOREADY) IVPB 1250 mg/250 mL        1,250 mg 166.7 mL/hr over 90 Minutes Intravenous Every 12 hours 12/28/22 1001     12/28/22 0530  vancomycin (VANCOREADY) IVPB 1500 mg/300 mL        1,500 mg 150 mL/hr over 120 Minutes Intravenous  Once 12/28/22 0524 12/28/22 0811        Medications  Scheduled Meds:  enoxaparin (LOVENOX) injection  40 mg Subcutaneous Q24H   feeding supplement  237 mL Oral BID BM   sodium chloride flush  3 mL Intravenous Q12H   Continuous Infusions:  vancomycin 1,250 mg (01/01/23 0542)   PRN Meds:.acetaminophen **OR** acetaminophen, ketorolac, ondansetron **OR** ondansetron (ZOFRAN) IV    Subjective:   [name] was seen and examined today.  Pain and the draining has improved.   Objective:   Vitals:   12/31/22 1500 12/31/22 2025 01/01/23 0426 01/01/23 0727  BP: 115/68 113/70 101/60 (!) 94/58  Pulse: 88 91 80 66  Resp: 17 18 18 16   Temp: 98.9 F (37.2 C) 99 F (37.2 C) 98 F (36.7 C) 97.9 F (36.6 C)  TempSrc: Oral  Oral Oral Oral  SpO2: 100% 99% 100% 99%  Weight:      Height:       No intake or output data in the 24 hours ending 01/01/23 1456  Filed Weights   12/28/22 0345  Weight: 67 kg     Exam General exam: Appears calm and comfortable  Respiratory system: Clear to auscultation. Respiratory effort normal. Cardiovascular system: S1 & S2 heard, RRR. No JVD,  Gastrointestinal system: Abdomen is nondistended, soft and nontender. Central nervous system: Alert and oriented.  Extremities: elbow wound drainage is minimal.  Skin: No rashes,  Psychiatry: Mood & affect appropriate.      Data Reviewed:  I have personally reviewed following labs and imaging studies   CBC Lab Results  Component Value Date   WBC 5.6 12/29/2022   RBC 4.57 12/29/2022   HGB 14.1 12/29/2022   HCT 41.3 12/29/2022   MCV 90.4 12/29/2022   MCH 30.9 12/29/2022   PLT 202 12/29/2022   MCHC 34.1 12/29/2022   RDW 11.8 12/29/2022   LYMPHSABS 2.0 12/28/2022    MONOABS 1.2 (H) 12/28/2022   EOSABS 0.1 12/28/2022   BASOSABS 0.0 12/28/2022     Last metabolic panel Lab Results  Component Value Date   NA 136 12/29/2022   K 3.8 12/29/2022   CL 107 12/29/2022   CO2 23 12/29/2022   BUN 12 12/29/2022   CREATININE 0.70 12/29/2022   GLUCOSE 103 (H) 12/29/2022   GFRNONAA >60 12/29/2022   GFRAA >60 05/19/2018   CALCIUM 8.9 12/29/2022   PROT 6.0 (L) 12/29/2022   ALBUMIN 2.8 (L) 12/29/2022   BILITOT 0.4 12/29/2022   ALKPHOS 49 12/29/2022   AST 19 12/29/2022   ALT 22 12/29/2022   ANIONGAP 6 12/29/2022    CBG (last 3)  No results for input(s): "GLUCAP" in the last 72 hours.    Coagulation Profile: No results for input(s): "INR", "PROTIME" in the last 168 hours.   Radiology Studies: No results found.     Kathlen Mody M.D. [facility] 01/01/2023, 2:56 PM  Available via Epic secure chat 7am-7pm After 7 pm, please refer to night coverage provider listed on amion.

## 2023-01-02 ENCOUNTER — Telehealth (HOSPITAL_COMMUNITY): Payer: Self-pay | Admitting: Pharmacy Technician

## 2023-01-02 ENCOUNTER — Other Ambulatory Visit (HOSPITAL_COMMUNITY): Payer: Self-pay

## 2023-01-02 LAB — CULTURE, BLOOD (ROUTINE X 2)
Culture: NO GROWTH
Culture: NO GROWTH
Special Requests: ADEQUATE
Special Requests: ADEQUATE

## 2023-01-02 MED ORDER — LINEZOLID 600 MG PO TABS
600.0000 mg | ORAL_TABLET | Freq: Two times a day (BID) | ORAL | 0 refills | Status: AC
Start: 2023-01-02 — End: 2023-01-12
  Filled 2023-01-02: qty 20, 10d supply, fill #0

## 2023-01-02 MED ORDER — ENSURE ENLIVE PO LIQD
237.0000 mL | Freq: Two times a day (BID) | ORAL | 12 refills | Status: DC
  Filled 2023-01-02: qty 237, 1d supply, fill #0

## 2023-01-02 NOTE — Discharge Summary (Signed)
Physician Discharge Summary   Patient: [name] MRN: [id number] DOB: 06-01-1995  Admit date:     12/27/2022  Discharge date: {dischdate:26783}  Discharge Physician: Kathlen Mody   PCP: Patient, No Pcp Per   Recommendations at discharge:  {Tip this will not be part of the note when signed- Example include specific recommendations for outpatient follow-up, pending tests to follow-up on. (Optional):26781}  ***  Discharge Diagnoses: Principal Problem:   Abscess of elbow  Resolved Problems:   * No resolved hospital problems. [facility] Course: [name] is a 27 y.o. M with polysubstance abuse, depression, cardiomyopathy NOS who presented with few days progressive redness, swelling, fluctuance and pain of the right elbow.  Assessment and Plan: No notes have been filed under this hospital service. Service: Hospitalist     {Tip this will not be part of the note when signed Body mass index is 21.81 kg/m. , ,  (Optional):26781}  {(NOTE) Pain control PDMP Statment (Optional):26782} Consultants: *** Procedures performed: ***  Disposition: {Plan; Disposition:26390} Diet recommendation:  Discharge Diet Orders (From admission, onward)     Start     Ordered   01/02/23 0000  Diet - low sodium heart healthy        01/02/23 0843           {Diet_Plan:26776} DISCHARGE MEDICATION: Allergies as of 01/02/2023   No Known Allergies      Medication List     TAKE these medications    feeding supplement Liqd Take 237 mLs by mouth 2 (two) times daily between meals.   linezolid 600 MG tablet Commonly known as: ZYVOX Take 1 tablet (600 mg total) by mouth 2 (two) times daily for 10 days.               Discharge Care Instructions  (From admission, onward)           Start     Ordered   01/02/23 0000  Discharge wound care:       Comments: Cover with nonadherent dressing and 4 x 4 and secure with gauze dressing   01/02/23 0843            Follow-up Information      Services, [facility] Follow up.   Why: You can self [place] and they will do a assessment and evaluation Contact information: [address] [place] [address] [phone]                Discharge Exam: Ceasar Mons Weights   12/28/22 0345  Weight: 67 kg   ***  Condition at discharge: {DC Condition:26389}  The results of significant diagnostics from this hospitalization (including imaging, microbiology, ancillary and laboratory) are listed below for reference.   Imaging Studies: ECHOCARDIOGRAM COMPLETE  Result Date: 12/28/2022    ECHOCARDIOGRAM REPORT   Patient Name:   [name] Date of Exam: 12/28/2022 Medical Rec #:  [id number]    Height:       69.0 in Accession #:    [id number]   Weight:       147.7 lb Date of Birth:  Nov 28, 1995     BSA:          1.816 m Patient Age:    27 years     BP:           93/61 mmHg Patient Gender: M            HR:  81 bpm. Exam Location:  Inpatient Procedure: 2D Echo, Cardiac Doppler and Color Doppler Indications:    Cardiomyopathy  History:        Patient has prior history of Echocardiogram examinations, most                 recent 02/05/2018. Cardiomegaly; Signs/Symptoms:Chest Pain.  Sonographer:    Melissa Morford RDCS (AE, PE) Referring Phys: [id number] ALLISON L ELLIS IMPRESSIONS  1. Left ventricular ejection fraction, by estimation, is 55 to 60%. The left ventricle has normal function. The left ventricle has no regional wall motion abnormalities. Left ventricular diastolic parameters were normal.  2. Right ventricular systolic function is normal. The right ventricular size is normal.  3. The mitral valve is normal in structure. No evidence of mitral valve regurgitation. No evidence of mitral stenosis. 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  4. The aortic valve is normal in structure. Aortic valve regurgitation is not visualized. No aortic stenosis is  present.  5. The inferior vena cava is normal in size with greater than 50% respiratory variability, suggesting right atrial pressure of 3 mmHg. Comparison(s): Prior images reviewed side by side. The left ventricular function has improved. FINDINGS  Left Ventricle: Left ventricular ejection fraction, by estimation, is 55 to 60%. The left ventricle has normal function. The left ventricle has no regional wall motion abnormalities. The left ventricular internal cavity size was normal in size. There is  no left ventricular hypertrophy. Left ventricular diastolic parameters were normal. Right Ventricle: The right ventricular size is normal. No increase in right ventricular wall thickness. Right ventricular systolic function is normal. Left Atrium: Left atrial size was normal in size. Right Atrium: Right atrial size was normal in size. Pericardium: There is no evidence of pericardial effusion. Mitral Valve: The mitral valve is normal in structure. 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 No evidence of mitral valve regurgitation. No evidence of mitral valve stenosis. Tricuspid Valve: The tricuspid valve is normal in structure. Tricuspid valve regurgitation is trivial. No evidence of tricuspid stenosis. Aortic Valve: The aortic valve is normal in structure. Aortic valve regurgitation is not visualized. No aortic stenosis is present. Pulmonic Valve: The pulmonic valve was normal in structure. Pulmonic valve regurgitation is not visualized. No evidence of pulmonic stenosis. Aorta: The aortic root is normal in size and structure. Venous: The inferior vena cava is normal in size with greater than 50% respiratory variability, suggesting right atrial pressure of 3 mmHg. IAS/Shunts: No atrial level shunt detected by color flow Doppler.  LEFT VENTRICLE PLAX 2D LVIDd:         5.20 cm   Diastology LVIDs:         3.30 cm   LV e' medial:    11.40 cm/s LV PW:         0.80  cm   LV E/e' medial:  5.0 LV IVS:        0.60 cm   LV e' lateral:   12.80 cm/s LVOT diam:     2.20 cm   LV E/e' lateral: 4.5 LV SV:         69 LV SV Index:   38 LVOT Area:     3.80 cm  RIGHT VENTRICLE RV S prime:     11.70 cm/s TAPSE (M-mode): 2.5 cm LEFT ATRIUM  Index        RIGHT ATRIUM           Index LA diam:        2.60 cm 1.43 cm/m   RA Area:     11.60 cm LA Vol (A2C):   32.8 ml 18.06 ml/m  RA Volume:   27.10 ml  14.92 ml/m LA Vol (A4C):   25.7 ml 14.15 ml/m LA Biplane Vol: 30.1 ml 16.57 ml/m  AORTIC VALVE LVOT Vmax:   90.70 cm/s LVOT Vmean:  67.400 cm/s LVOT VTI:    0.181 m  AORTA Ao Root diam: 2.60 cm MITRAL VALVE               TRICUSPID VALVE MV Area (PHT): 3.70 cm    TR Peak grad:   20.1 mmHg MV Decel Time: 205 msec    TR Vmax:        224.00 cm/s MV E velocity: 57.50 cm/s MV A velocity: 37.20 cm/s  SHUNTS MV E/A ratio:  1.55        Systemic VTI:  0.18 m                            Systemic Diam: 2.20 cm Rachelle Hora Croitoru MD Electronically signed by Thurmon Fair MD Signature Date/Time: 12/28/2022/2:31:41 PM    Final    DG Elbow Complete Right  Result Date: 12/27/2022 CLINICAL DATA:  Swelling.  Abscess with draining wound. EXAM: RIGHT ELBOW - COMPLETE 4 VIEW COMPARISON:  None Available. FINDINGS: No fracture or dislocation. Preserved joint spaces and bone mineralization. No joint effusion on lateral view. There is dorsal soft tissue swelling about the olecranon with some soft tissue irregularity and lucency in this location. Please correlate for the exact location of soft tissue abnormality. No underlying bony erosive changes. If there is further concern of bone or soft tissue infection, additional cross-sectional study could be useful as clinically appropriate. IMPRESSION: Dorsal soft tissue swelling with soft tissue gas. Please correlate with exact location of soft tissue abnormality. No bony erosive changes or joint effusion seen by x-ray at this time Electronically Signed   By: Karen Kays M.D.   On: 12/27/2022 20:48    Microbiology: Results for orders placed or performed during the hospital encounter of 12/27/22  Blood culture (routine x 2)     Status: None   Collection Time: 12/28/22  4:20 AM   Specimen: BLOOD  Result Value Ref Range Status   Specimen Description BLOOD LEFT ANTECUBITAL  Final   Special Requests   Final    BOTTLES DRAWN AEROBIC AND ANAEROBIC Blood Culture adequate volume   Culture   Final    NO GROWTH 5 DAYS Performed at [facility], [address] [place], [place] [address]    Report Status 01/02/2023 FINAL  Final  Blood culture (routine x 2)     Status: None   Collection Time: 12/28/22  4:30 AM   Specimen: BLOOD  Result Value Ref Range Status   Specimen Description BLOOD RIGHT ANTECUBITAL  Final   Special Requests   Final    BOTTLES DRAWN AEROBIC AND ANAEROBIC Blood Culture adequate volume   Culture   Final    NO GROWTH 5 DAYS Performed at [facility], [address], [place], [place] [address]    Report Status 01/02/2023 FINAL  Final  MRSA Next Gen by PCR, Nasal     Status: Abnormal  Collection Time: 12/28/22  9:25 AM   Specimen: Nasal Mucosa; Nasal Swab  Result Value Ref Range Status   MRSA by PCR Next Gen DETECTED (A) NOT DETECTED Final    Comment: RESULT CALLED TO, READ BACK BY AND VERIFIED WITH: RN ALAN D ON 12/30/22 @ 1514 BY DRT (NOTE) The GeneXpert MRSA Assay (FDA approved for NASAL specimens only), is one component of a comprehensive MRSA colonization surveillance program. It is not intended to diagnose MRSA infection nor to guide or monitor treatment for MRSA infections. Test performance is not FDA approved in patients less than 36 years old. Performed at [facility], [address], [place], [place] [address]     Labs: CBC: Recent Labs  Lab 12/28/22 0430 12/29/22 0456  WBC 9.0 5.6  NEUTROABS 5.6  --   HGB 14.1 14.1  HCT 42.2 41.3  MCV 89.0 90.4  PLT PLATELET CLUMPS NOTED ON SMEAR, COUNT  APPEARS DECREASED 202   Basic Metabolic Panel: Recent Labs  Lab 12/28/22 0430 12/29/22 0456  NA 138 136  K 3.6 3.8  CL 101 107  CO2 25 23  GLUCOSE 87 103*  BUN 14 12  CREATININE 0.81 0.70  CALCIUM 9.0 8.9   Liver Function Tests: Recent Labs  Lab 12/29/22 0456  AST 19  ALT 22  ALKPHOS 49  BILITOT 0.4  PROT 6.0*  ALBUMIN 2.8*   CBG: No results for input(s): "GLUCAP" in the last 168 hours.  Discharge time spent: {LESS THAN/GREATER UEAV:40981} 30 minutes.  Signed: Kathlen Mody, MD [facility] 01/02/2023

## 2023-01-02 NOTE — Plan of Care (Signed)

## 2023-01-02 NOTE — Telephone Encounter (Signed)
Pharmacy Patient Advocate Encounter  Received notification from [facility] that Prior Authorization for Linezolid 600MG  tablets  has been DENIED.  Full denial letter will be uploaded to the media tab. See denial reason below.   PA #/Case ID/Reference #: [id number]

## 2023-01-02 NOTE — Telephone Encounter (Signed)
Pharmacy Patient Advocate Encounter   Received notification that prior authorization for Linezolid 600MG  tablets is required/requested.   Insurance verification completed.   The patient is insured through [facility] .   Per test claim: PA required; PA submitted to [facility] via CoverMyMeds Key/confirmation #/EOC [id number] Status is pending

## 2023-01-02 NOTE — Plan of Care (Signed)
  Problem: Education: Goal: Knowledge of General Education information will improve Description: Including pain rating scale, medication(s)/side effects and non-pharmacologic comfort measures 01/02/2023 0942 by Doyce Para, RN Outcome: Progressing 01/02/2023 0935 by Doyce Para, RN Outcome: Progressing   Problem: Health Behavior/Discharge Planning: Goal: Ability to manage health-related needs will improve 01/02/2023 0942 by Doyce Para, RN Outcome: Progressing 01/02/2023 0935 by Doyce Para, RN Outcome: Progressing   Problem: Clinical Measurements: Goal: Ability to maintain clinical measurements within normal limits will improve 01/02/2023 0942 by Doyce Para, RN Outcome: Progressing 01/02/2023 0935 by Doyce Para, RN Outcome: Progressing Goal: Will remain free from infection 01/02/2023 0942 by Doyce Para, RN Outcome: Progressing 01/02/2023 0935 by Doyce Para, RN Outcome: Progressing Goal: Diagnostic test results will improve 01/02/2023 0942 by Doyce Para, RN Outcome: Progressing 01/02/2023 0935 by Doyce Para, RN Outcome: Progressing Goal: Respiratory complications will improve 01/02/2023 0942 by Doyce Para, RN Outcome: Progressing 01/02/2023 0935 by Doyce Para, RN Outcome: Progressing Goal: Cardiovascular complication will be avoided 01/02/2023 0942 by Doyce Para, RN Outcome: Progressing 01/02/2023 0935 by Doyce Para, RN Outcome: Progressing   Problem: Activity: Goal: Risk for activity intolerance will decrease 01/02/2023 0942 by Doyce Para, RN Outcome: Progressing 01/02/2023 0935 by Doyce Para, RN Outcome: Progressing   Problem: Nutrition: Goal: Adequate nutrition will be maintained 01/02/2023 0942 by Doyce Para, RN Outcome: Progressing 01/02/2023 0935 by Doyce Para, RN Outcome: Progressing   Problem: Coping: Goal: Level  of anxiety will decrease 01/02/2023 0942 by Doyce Para, RN Outcome: Progressing 01/02/2023 0935 by Doyce Para, RN Outcome: Progressing   Problem: Elimination: Goal: Will not experience complications related to bowel motility 01/02/2023 0942 by Doyce Para, RN Outcome: Progressing 01/02/2023 0935 by Doyce Para, RN Outcome: Progressing Goal: Will not experience complications related to urinary retention 01/02/2023 0942 by Doyce Para, RN Outcome: Progressing 01/02/2023 0935 by Doyce Para, RN Outcome: Progressing   Problem: Pain Managment: Goal: General experience of comfort will improve 01/02/2023 0942 by Doyce Para, RN Outcome: Progressing 01/02/2023 0935 by Doyce Para, RN Outcome: Progressing   Problem: Safety: Goal: Ability to remain free from injury will improve 01/02/2023 0942 by Doyce Para, RN Outcome: Progressing 01/02/2023 0935 by Doyce Para, RN Outcome: Progressing   Problem: Skin Integrity: Goal: Risk for impaired skin integrity will decrease 01/02/2023 0942 by Doyce Para, RN Outcome: Progressing 01/02/2023 0935 by Doyce Para, RN Outcome: Progressing

## 2023-01-02 NOTE — Plan of Care (Signed)
  Problem: Education: Goal: Knowledge of General Education information will improve Description: Including pain rating scale, medication(s)/side effects and non-pharmacologic comfort measures 01/02/2023 0943 by Doyce Para, RN Outcome: Adequate for Discharge 01/02/2023 [phone] by Doyce Para, RN Outcome: Progressing 01/02/2023 0935 by Doyce Para, RN Outcome: Progressing   Problem: Health Behavior/Discharge Planning: Goal: Ability to manage health-related needs will improve 01/02/2023 0943 by Doyce Para, RN Outcome: Adequate for Discharge 01/02/2023 0942 by Doyce Para, RN Outcome: Progressing 01/02/2023 0935 by Doyce Para, RN Outcome: Progressing   Problem: Clinical Measurements: Goal: Ability to maintain clinical measurements within normal limits will improve 01/02/2023 0943 by Doyce Para, RN Outcome: Adequate for Discharge 01/02/2023 0942 by Doyce Para, RN Outcome: Progressing 01/02/2023 0935 by Doyce Para, RN Outcome: Progressing Goal: Will remain free from infection 01/02/2023 0943 by Doyce Para, RN Outcome: Adequate for Discharge 01/02/2023 0942 by Doyce Para, RN Outcome: Progressing 01/02/2023 0935 by Doyce Para, RN Outcome: Progressing Goal: Diagnostic test results will improve 01/02/2023 0943 by Doyce Para, RN Outcome: Adequate for Discharge 01/02/2023 0942 by Doyce Para, RN Outcome: Progressing 01/02/2023 0935 by Doyce Para, RN Outcome: Progressing Goal: Respiratory complications will improve 01/02/2023 0943 by Doyce Para, RN Outcome: Adequate for Discharge 01/02/2023 0942 by Doyce Para, RN Outcome: Progressing 01/02/2023 0935 by Doyce Para, RN Outcome: Progressing Goal: Cardiovascular complication will be avoided 01/02/2023 0943 by Doyce Para, RN Outcome: Adequate for Discharge 01/02/2023 0942 by Doyce Para, RN Outcome: Progressing 01/02/2023 0935 by Doyce Para, RN Outcome: Progressing   Problem: Activity: Goal: Risk for activity intolerance will decrease 01/02/2023 0943 by Doyce Para, RN Outcome: Adequate for Discharge 01/02/2023 [phone] by Doyce Para, RN Outcome: Progressing 01/02/2023 0935 by Doyce Para, RN Outcome: Progressing   Problem: Nutrition: Goal: Adequate nutrition will be maintained 01/02/2023 0943 by Doyce Para, RN Outcome: Adequate for Discharge 01/02/2023 0942 by Doyce Para, RN Outcome: Progressing 01/02/2023 0935 by Doyce Para, RN Outcome: Progressing   Problem: Coping: Goal: Level of anxiety will decrease 01/02/2023 0943 by Doyce Para, RN Outcome: Adequate for Discharge 01/02/2023 0942 by Doyce Para, RN Outcome: Progressing 01/02/2023 0935 by Doyce Para, RN Outcome: Progressing   Problem: Elimination: Goal: Will not experience complications related to bowel motility 01/02/2023 0943 by Doyce Para, RN Outcome: Adequate for Discharge 01/02/2023 0942 by Doyce Para, RN Outcome: Progressing 01/02/2023 0935 by Doyce Para, RN Outcome: Progressing Goal: Will not experience complications related to urinary retention 01/02/2023 0943 by Doyce Para, RN Outcome: Adequate for Discharge 01/02/2023 0942 by Doyce Para, RN Outcome: Progressing 01/02/2023 0935 by Doyce Para, RN Outcome: Progressing   Problem: Pain Managment: Goal: General experience of comfort will improve 01/02/2023 0943 by Doyce Para, RN Outcome: Adequate for Discharge 01/02/2023 0942 by Doyce Para, RN Outcome: Progressing 01/02/2023 0935 by Doyce Para, RN Outcome: Progressing   Problem: Safety: Goal: Ability to remain free from injury will improve 01/02/2023 0943 by Doyce Para, RN Outcome: Adequate for Discharge 01/02/2023 0942  by Doyce Para, RN Outcome: Progressing 01/02/2023 0935 by Doyce Para, RN Outcome: Progressing   Problem: Skin Integrity: Goal: Risk for impaired skin integrity will decrease 01/02/2023 0943 by Doyce Para, RN Outcome: Adequate for Discharge 01/02/2023 0942 by Doyce Para, RN Outcome: Progressing 01/02/2023 0935 by Doyce Para, RN Outcome: Progressing

## 2023-06-24 ENCOUNTER — Emergency Department (HOSPITAL_COMMUNITY)
Admission: EM | Admit: 2023-06-24 | Discharge: 2023-06-24 | Disposition: A | Discharge status: Home / Self Care | Payer: MEDICAID | Attending: Emergency Medicine | Admitting: Emergency Medicine

## 2023-06-24 ENCOUNTER — Other Ambulatory Visit: Payer: Self-pay

## 2023-06-24 ENCOUNTER — Emergency Department (HOSPITAL_COMMUNITY): Payer: MEDICAID

## 2023-06-24 ENCOUNTER — Encounter (HOSPITAL_COMMUNITY): Payer: Self-pay | Admitting: *Deleted

## 2023-06-24 DIAGNOSIS — T23011A Burn of unspecified degree of right thumb (nail), initial encounter: Secondary | ICD-10-CM

## 2023-06-24 DIAGNOSIS — X088XXA Exposure to other specified smoke, fire and flames, initial encounter: Secondary | ICD-10-CM | POA: Diagnosis not present

## 2023-06-24 DIAGNOSIS — T23211A Burn of second degree of right thumb (nail), initial encounter: Secondary | ICD-10-CM | POA: Diagnosis present

## 2023-06-24 MED ORDER — TETANUS-DIPHTH-ACELL PERTUSSIS 5-2.5-18.5 LF-MCG/0.5 IM SUSY
0.5000 mL | PREFILLED_SYRINGE | Freq: Once | INTRAMUSCULAR | Status: DC
  Filled 2023-06-24: qty 0.5

## 2023-06-24 MED ORDER — DOXYCYCLINE HYCLATE 100 MG PO CAPS: 100.0000 mg | ORAL_CAPSULE | Freq: Two times a day (BID) | ORAL | 0 refills | Status: DC

## 2023-06-24 MED ORDER — DOXYCYCLINE HYCLATE 100 MG PO TABS
100.0000 mg | ORAL_TABLET | Freq: Once | ORAL | Status: DC
  Filled 2023-06-24: qty 1

## 2023-06-24 MED ORDER — IBUPROFEN 400 MG PO TABS
400.0000 mg | ORAL_TABLET | Freq: Once | ORAL | Status: DC
  Filled 2023-06-24: qty 1
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
## 2023-06-24 NOTE — ED Notes (Signed)
 Patient not in room

## 2023-06-24 NOTE — Discharge Instructions (Addendum)
 Try to keep your hand clean.  If it starts getting really red and more swollen in the next 2 to 3 days return to the emergency room.  Also applying ice can be helpful for the pain.

## 2023-06-24 NOTE — ED Notes (Signed)
 Patient left without discharge papers and prescription.   Patient also did not receive his TDAP, Doxycycline, or advil because he was not in room.
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
## 2023-06-29 ENCOUNTER — Other Ambulatory Visit: Payer: Self-pay

## 2023-06-29 ENCOUNTER — Emergency Department (HOSPITAL_COMMUNITY): Payer: MEDICAID

## 2023-06-29 ENCOUNTER — Inpatient Hospital Stay (HOSPITAL_COMMUNITY)
Admission: EM | Admit: 2023-06-29 | Discharge: 2023-07-04 | DRG: 516 | Disposition: A | Discharge status: Home / Self Care | Payer: MEDICAID | Attending: Internal Medicine | Admitting: Internal Medicine

## 2023-06-29 DIAGNOSIS — M869 Osteomyelitis, unspecified: Principal | ICD-10-CM | POA: Diagnosis present

## 2023-06-29 DIAGNOSIS — L02511 Cutaneous abscess of right hand: Secondary | ICD-10-CM | POA: Diagnosis present

## 2023-06-29 DIAGNOSIS — B9562 Methicillin resistant Staphylococcus aureus infection as the cause of diseases classified elsewhere: Secondary | ICD-10-CM | POA: Diagnosis present

## 2023-06-29 DIAGNOSIS — T23011D Burn of unspecified degree of right thumb (nail), subsequent encounter: Secondary | ICD-10-CM

## 2023-06-29 DIAGNOSIS — Z5900 Homelessness unspecified: Secondary | ICD-10-CM

## 2023-06-29 DIAGNOSIS — F1910 Other psychoactive substance abuse, uncomplicated: Secondary | ICD-10-CM | POA: Diagnosis present

## 2023-06-29 DIAGNOSIS — F17210 Nicotine dependence, cigarettes, uncomplicated: Secondary | ICD-10-CM | POA: Diagnosis present

## 2023-06-29 DIAGNOSIS — I429 Cardiomyopathy, unspecified: Secondary | ICD-10-CM | POA: Diagnosis present

## 2023-06-29 DIAGNOSIS — K219 Gastro-esophageal reflux disease without esophagitis: Secondary | ICD-10-CM | POA: Diagnosis present

## 2023-06-29 DIAGNOSIS — M86141 Other acute osteomyelitis, right hand: Secondary | ICD-10-CM | POA: Diagnosis not present

## 2023-06-29 DIAGNOSIS — I96 Gangrene, not elsewhere classified: Secondary | ICD-10-CM | POA: Diagnosis present

## 2023-06-29 DIAGNOSIS — J45909 Unspecified asthma, uncomplicated: Secondary | ICD-10-CM | POA: Diagnosis present

## 2023-06-29 DIAGNOSIS — T23011A Burn of unspecified degree of right thumb (nail), initial encounter: Secondary | ICD-10-CM | POA: Diagnosis present

## 2023-06-29 DIAGNOSIS — L03011 Cellulitis of right finger: Secondary | ICD-10-CM | POA: Diagnosis present

## 2023-06-29 LAB — COMPREHENSIVE METABOLIC PANEL
ALT: 20 U/L (ref 0–44)
AST: 21 U/L (ref 15–41)
Albumin: 3.7 g/dL (ref 3.5–5.0)
Alkaline Phosphatase: 56 U/L (ref 38–126)
Anion gap: 9 (ref 5–15)
BUN: 9 mg/dL (ref 6–20)
CO2: 25 mmol/L (ref 22–32)
Calcium: 9.5 mg/dL (ref 8.9–10.3)
Chloride: 103 mmol/L (ref 98–111)
Creatinine, Ser: 0.73 mg/dL (ref 0.61–1.24)
GFR, Estimated: >60 mL/min (ref >60)
Glucose, Bld: 110 mg/dL — ABNORMAL HIGH (ref 70–99)
Potassium: 3.8 mmol/L (ref 3.5–5.1)
Sodium: 137 mmol/L (ref 135–145)
Total Bilirubin: 1 mg/dL (ref 0.0–1.2)
Total Protein: 7 g/dL (ref 6.5–8.1)

## 2023-06-29 LAB — CBC WITH DIFFERENTIAL/PLATELET
Abs Immature Granulocytes: 0.04 10*3/uL (ref 0.00–0.07)
Basophils Absolute: 0 10*3/uL (ref 0.0–0.1)
Basophils Relative: 0 %
Eosinophils Absolute: 0.1 10*3/uL (ref 0.0–0.5)
Eosinophils Relative: 1 %
HCT: 40.3 % (ref 39.0–52.0)
Hemoglobin: 14.2 g/dL (ref 13.0–17.0)
Immature Granulocytes: 0 %
Lymphocytes Relative: 14 %
Lymphs Abs: 1.4 10*3/uL (ref 0.7–4.0)
MCH: 30.7 pg (ref 26.0–34.0)
MCHC: 35.2 g/dL (ref 30.0–36.0)
MCV: 87.2 fL (ref 80.0–100.0)
Monocytes Absolute: 1.2 10*3/uL — ABNORMAL HIGH (ref 0.1–1.0)
Monocytes Relative: 12 %
Neutro Abs: 7.4 10*3/uL (ref 1.7–7.7)
Neutrophils Relative %: 73 %
Platelets: UNDETERMINED 10*3/uL (ref 150–400)
RBC: 4.62 MIL/uL (ref 4.22–5.81)
RDW: 12.3 % (ref 11.5–15.5)
Smear Review: UNDETERMINED
WBC: 10.1 10*3/uL (ref 4.0–10.5)
nRBC: 0 % (ref 0.0–0.2)

## 2023-06-29 MED ORDER — MORPHINE SULFATE (PF) 4 MG/ML IV SOLN
4.0000 mg | Freq: Once | INTRAVENOUS | Status: AC
  Administered 2023-06-30: 4 mg via INTRAVENOUS
  Filled 2023-06-29: qty 1

## 2023-06-29 MED ORDER — TETANUS-DIPHTH-ACELL PERTUSSIS 5-2.5-18.5 LF-MCG/0.5 IM SUSY: 0.5000 mL | PREFILLED_SYRINGE | Freq: Once | INTRAMUSCULAR | Status: DC

## 2023-06-29 MED ORDER — OXYCODONE-ACETAMINOPHEN 5-325 MG PO TABS
1.0000 | ORAL_TABLET | Freq: Once | ORAL | Status: AC
  Administered 2023-06-29: 1 via ORAL
  Filled 2023-06-29: qty 1

## 2023-06-29 NOTE — ED Notes (Signed)
 Pt upset about long wait times, pt left lobby.

## 2023-06-29 NOTE — ED Notes (Signed)
 Pt verbally abusive in lobby, pacing, cussing. [employer] aware.
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
## 2023-06-29 NOTE — ED Notes (Signed)
 Recommended pt to stay as he was one of our longest waits. Pt sts "its fine." And proceeded to walk away and out the lobby.
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
## 2023-06-29 NOTE — ED Notes (Signed)
 Pt is back and decided to stay. Informed he would have to continue to wait for available room assignment

## 2023-06-30 ENCOUNTER — Observation Stay (HOSPITAL_COMMUNITY): Payer: MEDICAID | Admitting: Registered Nurse

## 2023-06-30 ENCOUNTER — Encounter (HOSPITAL_COMMUNITY)
Admission: EM | Disposition: A | Discharge status: Home / Self Care | Payer: Self-pay | Source: Home / Self Care | Attending: Internal Medicine

## 2023-06-30 ENCOUNTER — Other Ambulatory Visit: Payer: Self-pay

## 2023-06-30 ENCOUNTER — Encounter (HOSPITAL_COMMUNITY): Payer: Self-pay | Admitting: Internal Medicine

## 2023-06-30 DIAGNOSIS — J45909 Unspecified asthma, uncomplicated: Secondary | ICD-10-CM | POA: Diagnosis present

## 2023-06-30 DIAGNOSIS — F1910 Other psychoactive substance abuse, uncomplicated: Secondary | ICD-10-CM | POA: Diagnosis present

## 2023-06-30 DIAGNOSIS — I96 Gangrene, not elsewhere classified: Secondary | ICD-10-CM | POA: Diagnosis present

## 2023-06-30 DIAGNOSIS — Z5900 Homelessness unspecified: Secondary | ICD-10-CM | POA: Diagnosis not present

## 2023-06-30 DIAGNOSIS — M86141 Other acute osteomyelitis, right hand: Secondary | ICD-10-CM | POA: Diagnosis present

## 2023-06-30 DIAGNOSIS — L02511 Cutaneous abscess of right hand: Secondary | ICD-10-CM

## 2023-06-30 DIAGNOSIS — T23011D Burn of unspecified degree of right thumb (nail), subsequent encounter: Secondary | ICD-10-CM | POA: Diagnosis not present

## 2023-06-30 DIAGNOSIS — B9562 Methicillin resistant Staphylococcus aureus infection as the cause of diseases classified elsewhere: Secondary | ICD-10-CM | POA: Diagnosis present

## 2023-06-30 DIAGNOSIS — T23011A Burn of unspecified degree of right thumb (nail), initial encounter: Secondary | ICD-10-CM

## 2023-06-30 DIAGNOSIS — K219 Gastro-esophageal reflux disease without esophagitis: Secondary | ICD-10-CM | POA: Diagnosis present

## 2023-06-30 DIAGNOSIS — F17210 Nicotine dependence, cigarettes, uncomplicated: Secondary | ICD-10-CM | POA: Diagnosis present

## 2023-06-30 DIAGNOSIS — I429 Cardiomyopathy, unspecified: Secondary | ICD-10-CM | POA: Diagnosis present

## 2023-06-30 DIAGNOSIS — M869 Osteomyelitis, unspecified: Secondary | ICD-10-CM

## 2023-06-30 DIAGNOSIS — L03011 Cellulitis of right finger: Secondary | ICD-10-CM | POA: Diagnosis present

## 2023-06-30 HISTORY — PX: INCISION AND DRAINAGE ABSCESS: SHX5864

## 2023-06-30 LAB — LACTIC ACID, PLASMA
Lactic Acid, Venous: 1 mmol/L (ref 0.5–1.9)
Lactic Acid, Venous: 1.3 mmol/L (ref 0.5–1.9)

## 2023-06-30 LAB — CBC
HCT: 38.4 % — ABNORMAL LOW (ref 39.0–52.0)
Hemoglobin: 13 g/dL (ref 13.0–17.0)
MCH: 29.9 pg (ref 26.0–34.0)
MCHC: 33.9 g/dL (ref 30.0–36.0)
MCV: 88.3 fL (ref 80.0–100.0)
Platelets: UNDETERMINED 10*3/uL (ref 150–400)
RBC: 4.35 MIL/uL (ref 4.22–5.81)
RDW: 12.1 % (ref 11.5–15.5)
WBC: 10.7 10*3/uL — ABNORMAL HIGH (ref 4.0–10.5)
nRBC: 0 % (ref 0.0–0.2)

## 2023-06-30 LAB — PHOSPHORUS: Phosphorus: 2.2 mg/dL — ABNORMAL LOW (ref 2.5–4.6)

## 2023-06-30 LAB — BASIC METABOLIC PANEL
Anion gap: 5 (ref 5–15)
BUN: 10 mg/dL (ref 6–20)
CO2: 24 mmol/L (ref 22–32)
Calcium: 8.9 mg/dL (ref 8.9–10.3)
Chloride: 104 mmol/L (ref 98–111)
Creatinine, Ser: 0.86 mg/dL (ref 0.61–1.24)
GFR, Estimated: >60 mL/min (ref >60)
Glucose, Bld: 208 mg/dL — ABNORMAL HIGH (ref 70–99)
Potassium: 4.4 mmol/L (ref 3.5–5.1)
Sodium: 133 mmol/L — ABNORMAL LOW (ref 135–145)

## 2023-06-30 LAB — MAGNESIUM: Magnesium: 2 mg/dL (ref 1.7–2.4)

## 2023-06-30 SURGERY — INCISION AND DRAINAGE, ABSCESS
Anesthesia: General | Site: Thumb | Laterality: Right

## 2023-06-30 MED ORDER — ONDANSETRON HCL 4 MG/2ML IJ SOLN
INTRAMUSCULAR | Status: AC
  Filled 2023-06-30: qty 2

## 2023-06-30 MED ORDER — 0.9 % SODIUM CHLORIDE (POUR BTL) OPTIME
TOPICAL | Status: DC | PRN
  Administered 2023-06-30: 1000 mL

## 2023-06-30 MED ORDER — OXYCODONE HCL 5 MG PO TABS
5.0000 mg | ORAL_TABLET | Freq: Four times a day (QID) | ORAL | Status: DC | PRN
  Administered 2023-06-30 – 2023-07-04 (×13): 5 mg via ORAL
  Filled 2023-06-30 (×13): qty 1

## 2023-06-30 MED ORDER — VANCOMYCIN HCL IN DEXTROSE 1-5 GM/200ML-% IV SOLN
1000.0000 mg | Freq: Once | INTRAVENOUS | Status: AC
  Administered 2023-06-30: 1000 mg via INTRAVENOUS
  Filled 2023-06-30: qty 200

## 2023-06-30 MED ORDER — SUCCINYLCHOLINE CHLORIDE 200 MG/10ML IV SOSY
PREFILLED_SYRINGE | INTRAVENOUS | Status: DC | PRN
  Administered 2023-06-30: 100 mg via INTRAVENOUS

## 2023-06-30 MED ORDER — STERILE WATER FOR IRRIGATION IR SOLN
Status: DC | PRN
  Administered 2023-06-30: 500 mL

## 2023-06-30 MED ORDER — LACTATED RINGERS IV SOLN: INTRAVENOUS | Status: AC

## 2023-06-30 MED ORDER — FENTANYL CITRATE (PF) 250 MCG/5ML IJ SOLN
INTRAMUSCULAR | Status: DC | PRN
Start: 2023-06-30 — End: 2023-06-30
  Administered 2023-06-30: 100 ug via INTRAVENOUS

## 2023-06-30 MED ORDER — K PHOS MONO-SOD PHOS DI & MONO 155-852-130 MG PO TABS
250.0000 mg | ORAL_TABLET | Freq: Three times a day (TID) | ORAL | Status: AC
  Administered 2023-06-30 – 2023-07-01 (×4): 250 mg via ORAL
  Filled 2023-06-30 (×4): qty 1

## 2023-06-30 MED ORDER — VANCOMYCIN HCL 1250 MG/250ML IV SOLN
1250.0000 mg | Freq: Two times a day (BID) | INTRAVENOUS | Status: DC
  Administered 2023-06-30 – 2023-07-02 (×5): 1250 mg via INTRAVENOUS
  Filled 2023-06-30 (×6): qty 250

## 2023-06-30 MED ORDER — AMISULPRIDE (ANTIEMETIC) 5 MG/2ML IV SOLN: 10.0000 mg | Freq: Once | INTRAVENOUS | Status: DC | PRN

## 2023-06-30 MED ORDER — FENTANYL CITRATE (PF) 250 MCG/5ML IJ SOLN
INTRAMUSCULAR | Status: AC
  Filled 2023-06-30: qty 5

## 2023-06-30 MED ORDER — PROCHLORPERAZINE EDISYLATE 10 MG/2ML IJ SOLN: 5.0000 mg | Freq: Four times a day (QID) | INTRAMUSCULAR | Status: DC | PRN

## 2023-06-30 MED ORDER — MELATONIN 5 MG PO TABS: 5.0000 mg | ORAL_TABLET | Freq: Every evening | ORAL | Status: DC | PRN

## 2023-06-30 MED ORDER — DEXMEDETOMIDINE HCL IN NACL 80 MCG/20ML IV SOLN
INTRAVENOUS | Status: DC | PRN
  Administered 2023-06-30: 12 ug via INTRAVENOUS

## 2023-06-30 MED ORDER — ONDANSETRON HCL 4 MG/2ML IJ SOLN
INTRAMUSCULAR | Status: DC | PRN
  Administered 2023-06-30: 4 mg via INTRAVENOUS

## 2023-06-30 MED ORDER — HYDROMORPHONE HCL 1 MG/ML IJ SOLN
0.5000 mg | INTRAMUSCULAR | Status: DC | PRN
  Administered 2023-06-30 – 2023-07-04 (×18): 0.5 mg via INTRAVENOUS
  Filled 2023-06-30 (×19): qty 0.5

## 2023-06-30 MED ORDER — FENTANYL CITRATE (PF) 100 MCG/2ML IJ SOLN: 25.0000 ug | INTRAMUSCULAR | Status: DC | PRN

## 2023-06-30 MED ORDER — PIPERACILLIN-TAZOBACTAM 3.375 G IVPB
3.3750 g | Freq: Three times a day (TID) | INTRAVENOUS | Status: DC
  Administered 2023-06-30 – 2023-07-01 (×3): 3.375 g via INTRAVENOUS
  Filled 2023-06-30 (×3): qty 50

## 2023-06-30 MED ORDER — BUPIVACAINE HCL (PF) 0.25 % IJ SOLN
INTRAMUSCULAR | Status: DC | PRN
  Administered 2023-06-30: 30 mL

## 2023-06-30 MED ORDER — PROPOFOL 10 MG/ML IV BOLUS
INTRAVENOUS | Status: DC | PRN
  Administered 2023-06-30: 200 mg via INTRAVENOUS

## 2023-06-30 MED ORDER — DEXAMETHASONE SODIUM PHOSPHATE 10 MG/ML IJ SOLN
INTRAMUSCULAR | Status: DC | PRN
  Administered 2023-06-30: 5 mg via INTRAVENOUS

## 2023-06-30 MED ORDER — SODIUM CHLORIDE 0.9 % IV SOLN: INTRAVENOUS | Status: DC | PRN

## 2023-06-30 MED ORDER — MIDAZOLAM HCL 2 MG/2ML IJ SOLN
INTRAMUSCULAR | Status: AC
  Filled 2023-06-30: qty 2

## 2023-06-30 MED ORDER — LIDOCAINE 2% (20 MG/ML) 5 ML SYRINGE
INTRAMUSCULAR | Status: AC
  Filled 2023-06-30: qty 5

## 2023-06-30 MED ORDER — KETAMINE HCL 10 MG/ML IJ SOLN
INTRAMUSCULAR | Status: DC | PRN
  Administered 2023-06-30: 20 mg via INTRAVENOUS
  Administered 2023-06-30: 30 mg via INTRAVENOUS

## 2023-06-30 MED ORDER — MIDAZOLAM HCL 2 MG/2ML IJ SOLN
INTRAMUSCULAR | Status: DC | PRN
  Administered 2023-06-30: 2 mg via INTRAVENOUS

## 2023-06-30 MED ORDER — VITAMIN C 500 MG PO TABS
1000.0000 mg | ORAL_TABLET | Freq: Every day | ORAL | Status: DC
  Administered 2023-06-30 – 2023-07-04 (×5): 1000 mg via ORAL
  Filled 2023-06-30 (×5): qty 2

## 2023-06-30 MED ORDER — LIDOCAINE 2% (20 MG/ML) 5 ML SYRINGE
INTRAMUSCULAR | Status: DC | PRN
  Administered 2023-06-30: 60 mg via INTRAVENOUS

## 2023-06-30 MED ORDER — PROPOFOL 10 MG/ML IV BOLUS
INTRAVENOUS | Status: AC
  Filled 2023-06-30: qty 20

## 2023-06-30 MED ORDER — BUPIVACAINE HCL (PF) 0.25 % IJ SOLN
INTRAMUSCULAR | Status: AC
  Filled 2023-06-30: qty 30

## 2023-06-30 MED ORDER — POLYETHYLENE GLYCOL 3350 17 G PO PACK: 17.0000 g | PACK | Freq: Every day | ORAL | Status: DC | PRN

## 2023-06-30 MED ORDER — SUCCINYLCHOLINE CHLORIDE 200 MG/10ML IV SOSY
PREFILLED_SYRINGE | INTRAVENOUS | Status: AC
  Filled 2023-06-30: qty 10

## 2023-06-30 MED ORDER — DEXAMETHASONE SODIUM PHOSPHATE 10 MG/ML IJ SOLN
INTRAMUSCULAR | Status: AC
  Filled 2023-06-30: qty 1

## 2023-06-30 MED ORDER — PIPERACILLIN-TAZOBACTAM 3.375 G IVPB 30 MIN
3.3750 g | Freq: Once | INTRAVENOUS | Status: AC
  Administered 2023-06-30: 3.375 g via INTRAVENOUS
  Filled 2023-06-30: qty 50

## 2023-06-30 MED ORDER — ACETAMINOPHEN 325 MG PO TABS
650.0000 mg | ORAL_TABLET | Freq: Four times a day (QID) | ORAL | Status: DC | PRN
  Administered 2023-07-01 – 2023-07-04 (×2): 650 mg via ORAL
  Filled 2023-06-30 (×2): qty 2

## 2023-06-30 MED ORDER — KETAMINE HCL 50 MG/5ML IJ SOSY
PREFILLED_SYRINGE | INTRAMUSCULAR | Status: AC
  Filled 2023-06-30: qty 5

## 2023-06-30 SURGICAL SUPPLY — 57 items
ADAPTER CATH SYR TO TUBING 38M (ADAPTER) ×2 IMPLANT
BAG COUNTER SPONGE SURGICOUNT (BAG) ×2 IMPLANT
BNDG COHESIVE 2X5 TAN ST LF (GAUZE/BANDAGES/DRESSINGS) IMPLANT
BNDG ELASTIC 3INX 5YD STR LF (GAUZE/BANDAGES/DRESSINGS) ×2 IMPLANT
BNDG ELASTIC 4X5.8 VLCR STR LF (GAUZE/BANDAGES/DRESSINGS) ×2 IMPLANT
BNDG ESMARK 4X9 LF (GAUZE/BANDAGES/DRESSINGS) IMPLANT
BNDG GAUZE DERMACEA FLUFF 4 (GAUZE/BANDAGES/DRESSINGS) ×2 IMPLANT
CANNULA VESSEL 3MM 2 BLNT TIP (CANNULA) IMPLANT
CORD BIPOLAR FORCEPS 12FT (ELECTRODE) ×2 IMPLANT
COVER SURGICAL LIGHT HANDLE (MISCELLANEOUS) ×2 IMPLANT
CUFF TOURN SGL QUICK 18X4 (TOURNIQUET CUFF) IMPLANT
CUFF TRNQT CYL 24X4X16.5-23 (TOURNIQUET CUFF) IMPLANT
DRAIN PENROSE 12X.25 LTX STRL (MISCELLANEOUS) IMPLANT
DRSG XEROFORM 1X8 (GAUZE/BANDAGES/DRESSINGS) IMPLANT
FORCEPS BIPOLAR SPETZLER 8 1.0 (NEUROSURGERY SUPPLIES) IMPLANT
GAUZE PACKING IODOFORM 1/2INX (GAUZE/BANDAGES/DRESSINGS) IMPLANT
GAUZE PAD ABD 8X10 STRL (GAUZE/BANDAGES/DRESSINGS) ×4 IMPLANT
GAUZE SPONGE 4X4 12PLY STRL (GAUZE/BANDAGES/DRESSINGS) ×2 IMPLANT
GAUZE XEROFORM 1X8 LF (GAUZE/BANDAGES/DRESSINGS) ×2 IMPLANT
GLOVE BIO SURGEON STRL SZ7.5 (GLOVE) ×2 IMPLANT
GLOVE BIOGEL PI IND STRL 7.0 (GLOVE) IMPLANT
GLOVE BIOGEL PI IND STRL 8 (GLOVE) ×2 IMPLANT
GLOVE BIOGEL PI IND STRL 8.5 (GLOVE) IMPLANT
GLOVE PI ORTHO PRO STRL SZ8 (GLOVE) IMPLANT
GLOVE SURG ORTHO 8.0 STRL STRW (GLOVE) IMPLANT
GLOVE SURG SS PI 7.0 STRL IVOR (GLOVE) IMPLANT
GLOVE SURG SS PI 8.5 STRL STRW (GLOVE) IMPLANT
GOWN STRL REUS W/ TWL LRG LVL3 (GOWN DISPOSABLE) ×2 IMPLANT
GOWN STRL REUS W/ TWL XL LVL3 (GOWN DISPOSABLE) ×2 IMPLANT
KIT BASIN OR (CUSTOM PROCEDURE TRAY) ×2 IMPLANT
KIT TURNOVER KIT B (KITS) ×2 IMPLANT
LOOP VASCLR MAXI BLUE 18IN ST (MISCELLANEOUS) IMPLANT
LOOPS VASCLR MAXI BLUE 18IN ST (MISCELLANEOUS) IMPLANT
MANIFOLD NEPTUNE II (INSTRUMENTS) IMPLANT
NDL HYPO 25X1 1.5 SAFETY (NEEDLE) IMPLANT
NEEDLE HYPO 25X1 1.5 SAFETY (NEEDLE) ×1 IMPLANT
NS IRRIG 1000ML POUR BTL (IV SOLUTION) ×2 IMPLANT
PACK ORTHO EXTREMITY (CUSTOM PROCEDURE TRAY) ×2 IMPLANT
PAD ARMBOARD POSITIONER FOAM (MISCELLANEOUS) ×4 IMPLANT
SET CYSTO W/LG BORE CLAMP LF (SET/KITS/TRAYS/PACK) IMPLANT
SLING ARM FOAM STRAP LRG (SOFTGOODS) IMPLANT
SOL PREP POV-IOD 4OZ 10% (MISCELLANEOUS) ×4 IMPLANT
SPIKE FLUID TRANSFER (MISCELLANEOUS) ×2 IMPLANT
SPONGE T-LAP 4X18 ~~LOC~~+RFID (SPONGE) ×2 IMPLANT
SUT ETHILON 4 0 P 3 18 (SUTURE) IMPLANT
SUT ETHILON 4 0 PS 2 18 (SUTURE) IMPLANT
SUT MON AB 5-0 P3 18 (SUTURE) IMPLANT
SWAB COLLECTION DEVICE MRSA (MISCELLANEOUS) IMPLANT
SWAB CULTURE ESWAB REG 1ML (MISCELLANEOUS) IMPLANT
SYR 20ML LL LF (SYRINGE) ×2 IMPLANT
SYR CONTROL 10ML LL (SYRINGE) IMPLANT
TIE VASCULAR MAXI BLUE 18IN ST (MISCELLANEOUS) IMPLANT
TOWEL GREEN STERILE (TOWEL DISPOSABLE) ×2 IMPLANT
TUBE CONNECTING 12X1/4 (SUCTIONS) ×2 IMPLANT
TUBE NG 5FR 35IN ENFIT (TUBING) IMPLANT
UNDERPAD 30X36 HEAVY ABSORB (UNDERPADS AND DIAPERS) ×2 IMPLANT
YANKAUER SUCT BULB TIP NO VENT (SUCTIONS) ×2 IMPLANT
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
## 2023-06-30 NOTE — Progress Notes (Signed)
 Pharmacy Antibiotic Note  [name] is a 28 y.o. male admitted on 06/29/2023 with  wound infection, questionable osteomyelitis .  Pharmacy has been consulted for Vancomycin/Zosyn dosing. WBC 10.1, renal function good. S/P I&D in the OR with Dr. Merlyn Lot.   Plan: Vancomycin 1250 mg IV q12h >>>Estimated AUC: 469 Zosyn 3.375G IV q8h to be infused over 4 hours Trend WBC, temp, renal function  F/U infectious work-up Drug levels as indicated   Height: 5\' 9"  (175.3 cm) Weight: 63.5 kg (139 lb 15.9 oz) IBW/kg (Calculated) : 70.7  Temp (24hrs), Avg:98.1 F (36.7 C), Min:97.7 F (36.5 C), Max:98.6 F (37 C)  Recent Labs  Lab 06/29/23 1228 06/30/23 0020  WBC 10.1  --   CREATININE 0.73  --   LATICACIDVEN  --  1.0    Estimated Creatinine Clearance: 122.4 mL/min (by C-G formula based on SCr of 0.73 mg/dL).    No Known Allergies  Abran Duke, PharmD, BCPS Clinical Pharmacist Phone: [phone]
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
## 2023-06-30 NOTE — Discharge Instructions (Addendum)
 [facility] Instructions Hand Surgery  Wound Care: Keep your hand elevated above the level of your heart.  Do not allow it to dangle by your side.  Keep the dressing dry and do not remove it unless your doctor advises you to do so.  He will usually change it at the time of your post-op visit.  Moving your fingers is advised to stimulate circulation but will depend on the site of your surgery.  If you have a splint applied, your doctor will advise you regarding movement.  Activity: Do not drive or operate machinery today.  Rest today and then you may return to your normal activity and work as indicated by your physician.  Diet:  Drink liquids today or eat a light diet.  You may resume a regular diet tomorrow.    General expectations: Pain for two to three days. Fingers may become slightly swollen.  Call your doctor if any of the following occur: Severe pain not relieved by pain medication. Elevated temperature. Dressing soaked with blood. Inability to move fingers. White or bluish color to fingers.                  Intensive Outpatient Programs  [facility]    [facility] [address]     [address] [place],  [place]     [place], [place] [phone]      [phone]  [facility]   [facility]  (Inpatient and outpatient)  [phone] (Suboxone and Methadone) [address]           [phone]          [facility]    [facility] - Intensive Outpatient [address]     [address] [place], [place] [address]     [place], [place]  [phone]      [phone]  [facility] (Outpatient, Inpatient, [facility]  [facility] (Groups and Residental) (insurance only) [phone]    [place], [place]          [phone]       [facility]    [facility] (for caregivers and family) [address]     [address]  [address] [place], [place]     [place], [place] [phone]      [phone]  Residential Treatment Programs  [facility]  [place](2 years) Residential: 90 days)  [facility] ([facility] [address] [facility]      [address] [place], [place]     [place], [place] [phone]      [phone] or [phone]  [facility]    [facility] [address]      [address] [place], [place]     [place], [place] [phone]      [phone]  [facility]   [facility] ([facility]) [address]     [address] [place], [place] [address]     [place], [place] [phone]      [phone] Admissions: 8am-3pm M-F  [facility]: Residential Program [phone] Days)              [facility]: [facility]  [place], [place]     [place], [place]  [phone] or [phone]    (Walk in Hours over the weekend or by referral)   Melynda Ripple  Crisis: [facility]:[phone] (for crisis response 24 hours a day)

## 2023-06-30 NOTE — Anesthesia Postprocedure Evaluation (Signed)
 Anesthesia Post Note  Patient: [name]  Procedure(s) Performed: Incision and drainage right thumb felon with debridement of osteomyelitis (Right: Thumb)     Patient location during evaluation: PACU Anesthesia Type: General Level of consciousness: awake and alert Pain management: pain level controlled Vital Signs Assessment: post-procedure vital signs reviewed and stable Respiratory status: spontaneous breathing, nonlabored ventilation, respiratory function stable and patient connected to nasal cannula oxygen Cardiovascular status: blood pressure returned to baseline and stable Postop Assessment: no apparent nausea or vomiting Anesthetic complications: no   No notable events documented.  Last Vitals:  Vitals:   06/30/23 0348 06/30/23 0755  BP: 115/80 (!) 90/49  Pulse: 75 (!) 48  Resp: 18 16  Temp: 36.7 C 36.8 C  SpO2: 98% 98%    Last Pain:  Vitals:   06/30/23 0348  TempSrc: Oral  PainSc:                  Kennieth Rad

## 2023-06-30 NOTE — Transfer of Care (Signed)
 Immediate Anesthesia Transfer of Care Note  Patient: [name]  Procedure(s) Performed: INCISION AND DRAINAGE, ABSCESS (Right)  Patient Location: PACU  Anesthesia Type:General  Level of Consciousness: awake, alert , and drowsy  Airway & Oxygen Therapy: Patient Spontanous Breathing and Patient connected to nasal cannula oxygen  Post-op Assessment: Report given to RN and Post -op Vital signs reviewed and stable  Post vital signs: Reviewed and stable  Last Vitals:  Vitals Value Taken Time  BP 132/94 06/30/23 0256  Temp    Pulse 97 06/30/23 0258  Resp 9 06/30/23 0258  SpO2 96 % 06/30/23 0258  Vitals shown include unfiled device data.  Last Pain:  Vitals:   06/30/23 0030  TempSrc: Oral  PainSc: 8          Complications: No notable events documented.
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
## 2023-06-30 NOTE — Plan of Care (Signed)

## 2023-06-30 NOTE — Anesthesia Procedure Notes (Signed)
 Procedure Name: Intubation Date/Time: 06/30/2023 1:36 AM  Performed by: Laruth Bouchard., CRNAPre-anesthesia Checklist: Patient identified, Emergency Drugs available, Suction available, Patient being monitored and Timeout performed Patient Re-evaluated:Patient Re-evaluated prior to induction Oxygen Delivery Method: Circle system utilized Preoxygenation: Pre-oxygenation with 100% oxygen Induction Type: IV induction, Rapid sequence and Cricoid Pressure applied Laryngoscope Size: Mac and 4 Grade View: Grade I Tube type: Oral Tube size: 7.5 mm Number of attempts: 1 Airway Equipment and Method: Stylet Placement Confirmation: ETT inserted through vocal cords under direct vision, positive ETCO2 and breath sounds checked- equal and bilateral Secured at: 23 cm Tube secured with: Tape Dental Injury: Teeth and Oropharynx as per pre-operative assessment
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
## 2023-06-30 NOTE — Anesthesia Preprocedure Evaluation (Signed)
 Anesthesia Evaluation  Patient identified by MRN, date of birth, ID band Patient awake    Reviewed: Allergy & Precautions, NPO status , Patient's Chart, lab work & pertinent test results  Airway Mallampati: II  TM Distance: >3 FB Neck ROM: Full    Dental  (+) Dental Advisory Given   Pulmonary asthma , former smoker   breath sounds clear to auscultation       Cardiovascular negative cardio ROS  Rhythm:Regular Rate:Normal     Neuro/Psych negative neurological ROS     GI/Hepatic Neg liver ROS,GERD  ,,  Endo/Other  negative endocrine ROS    Renal/GU negative Renal ROS     Musculoskeletal   Abdominal   Peds  Hematology negative hematology ROS (+)   Anesthesia Other Findings   Reproductive/Obstetrics                             Anesthesia Physical Anesthesia Plan  ASA: 2  Anesthesia Plan: General   Post-op Pain Management: Toradol IV (intra-op)* and Ofirmev IV (intra-op)*   Induction: Intravenous  PONV Risk Score and Plan: 2 and Dexamethasone, Ondansetron and Treatment may vary due to age or medical condition  Airway Management Planned: Oral ETT  Additional Equipment:   Intra-op Plan:   Post-operative Plan: Extubation in OR  Informed Consent: I have reviewed the patients History and Physical, chart, labs and discussed the procedure including the risks, benefits and alternatives for the proposed anesthesia with the patient or authorized representative who has indicated his/her understanding and acceptance.     Dental advisory given  Plan Discussed with: CRNA  Anesthesia Plan Comments:        Anesthesia Quick Evaluation

## 2023-06-30 NOTE — Progress Notes (Signed)
 Postop note: Status post incision and drainage right thumb.  Gross purulence encountered.  Cultures taken.  Wound debrided including curetting of distal phalanx.  Start hydrotherapy in 2 to 4 days.  Okay for DVT prophylaxis.  Consider ID consult for antibiotic selection and duration given potential osteomyelitis of distal phalanx.  Okay for discharge home when afebrile, white blood count trending to normal, pain controlled.
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
## 2023-07-01 ENCOUNTER — Encounter (HOSPITAL_COMMUNITY): Payer: Self-pay | Admitting: Orthopedic Surgery

## 2023-07-01 DIAGNOSIS — B9562 Methicillin resistant Staphylococcus aureus infection as the cause of diseases classified elsewhere: Secondary | ICD-10-CM

## 2023-07-01 DIAGNOSIS — M86141 Other acute osteomyelitis, right hand: Secondary | ICD-10-CM

## 2023-07-01 DIAGNOSIS — M869 Osteomyelitis, unspecified: Secondary | ICD-10-CM | POA: Diagnosis present

## 2023-07-01 DIAGNOSIS — Z5900 Homelessness unspecified: Secondary | ICD-10-CM

## 2023-07-01 LAB — RENAL FUNCTION PANEL
Albumin: 3 g/dL — ABNORMAL LOW (ref 3.5–5.0)
Anion gap: 7 (ref 5–15)
BUN: 10 mg/dL (ref 6–20)
CO2: 29 mmol/L (ref 22–32)
Calcium: 8.9 mg/dL (ref 8.9–10.3)
Chloride: 101 mmol/L (ref 98–111)
Creatinine, Ser: 0.79 mg/dL (ref 0.61–1.24)
GFR, Estimated: >60 mL/min (ref >60)
Glucose, Bld: 103 mg/dL — ABNORMAL HIGH (ref 70–99)
Phosphorus: 3.4 mg/dL (ref 2.5–4.6)
Potassium: 3.9 mmol/L (ref 3.5–5.1)
Sodium: 137 mmol/L (ref 135–145)

## 2023-07-01 LAB — CBC WITH DIFFERENTIAL/PLATELET
Abs Immature Granulocytes: 0.03 10*3/uL (ref 0.00–0.07)
Basophils Absolute: 0.1 10*3/uL (ref 0.0–0.1)
Basophils Relative: 0 %
Eosinophils Absolute: 0.2 10*3/uL (ref 0.0–0.5)
Eosinophils Relative: 2 %
HCT: 39.9 % (ref 39.0–52.0)
Hemoglobin: 13.6 g/dL (ref 13.0–17.0)
Immature Granulocytes: 0 %
Lymphocytes Relative: 28 %
Lymphs Abs: 3.1 10*3/uL (ref 0.7–4.0)
MCH: 30.4 pg (ref 26.0–34.0)
MCHC: 34.1 g/dL (ref 30.0–36.0)
MCV: 89.1 fL (ref 80.0–100.0)
Monocytes Absolute: 1.2 10*3/uL — ABNORMAL HIGH (ref 0.1–1.0)
Monocytes Relative: 11 %
Neutro Abs: 6.7 10*3/uL (ref 1.7–7.7)
Neutrophils Relative %: 59 %
Platelets: 164 10*3/uL (ref 150–400)
RBC: 4.48 MIL/uL (ref 4.22–5.81)
RDW: 12.2 % (ref 11.5–15.5)
WBC: 11.2 10*3/uL — ABNORMAL HIGH (ref 4.0–10.5)
nRBC: 0 % (ref 0.0–0.2)

## 2023-07-01 LAB — MAGNESIUM: Magnesium: 1.9 mg/dL (ref 1.7–2.4)

## 2023-07-01 MED ORDER — CEFAZOLIN SODIUM-DEXTROSE 2-4 GM/100ML-% IV SOLN
2.0000 g | Freq: Three times a day (TID) | INTRAVENOUS | Status: DC
  Administered 2023-07-01 – 2023-07-02 (×3): 2 g via INTRAVENOUS
  Filled 2023-07-01 (×3): qty 100

## 2023-07-01 NOTE — Plan of Care (Signed)
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
## 2023-07-02 LAB — CBC
HCT: 41.6 % (ref 39.0–52.0)
Hemoglobin: 14.3 g/dL (ref 13.0–17.0)
MCH: 30.1 pg (ref 26.0–34.0)
MCHC: 34.4 g/dL (ref 30.0–36.0)
MCV: 87.6 fL (ref 80.0–100.0)
Platelets: 185 10*3/uL (ref 150–400)
RBC: 4.75 MIL/uL (ref 4.22–5.81)
RDW: 12 % (ref 11.5–15.5)
WBC: 7.3 10*3/uL (ref 4.0–10.5)
nRBC: 0 % (ref 0.0–0.2)

## 2023-07-02 LAB — C-REACTIVE PROTEIN: CRP: 3 mg/dL — ABNORMAL HIGH

## 2023-07-02 LAB — HIV ANTIBODY (ROUTINE TESTING W REFLEX): HIV Screen 4th Generation wRfx: NONREACTIVE

## 2023-07-02 LAB — SEDIMENTATION RATE: Sed Rate: 18 mm/h — ABNORMAL HIGH (ref 0–16)

## 2023-07-02 LAB — HEPATITIS A ANTIBODY, TOTAL: hep A Total Ab: NONREACTIVE

## 2023-07-02 LAB — HEPATITIS B SURFACE ANTIGEN: Hepatitis B Surface Ag: NONREACTIVE

## 2023-07-02 MED ORDER — VANCOMYCIN HCL 1.25 G IV SOLR
1250.0000 mg | Freq: Two times a day (BID) | INTRAVENOUS | Status: DC
  Administered 2023-07-03 – 2023-07-04 (×3): 1250 mg via INTRAVENOUS
  Filled 2023-07-02 (×5): qty 25

## 2023-07-02 MED ORDER — NICOTINE 14 MG/24HR TD PT24
14.0000 mg | MEDICATED_PATCH | Freq: Every day | TRANSDERMAL | Status: DC
  Administered 2023-07-02 – 2023-07-04 (×3): 14 mg via TRANSDERMAL
  Filled 2023-07-02 (×3): qty 1
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
## 2023-07-02 NOTE — Progress Notes (Signed)
 Subjective: No new complaints   Antibiotics:  Anti-infectives (From admission, onward)    Start     Dose/Rate Route Frequency Ordered Stop   07/01/23 1400  ceFAZolin (ANCEF) IVPB 2g/100 mL premix  Status:  Discontinued        2 g 200 mL/hr over 30 Minutes Intravenous Every 8 hours 07/01/23 1251 07/02/23 1101   06/30/23 1600  vancomycin (VANCOREADY) IVPB 1250 mg/250 mL        1,250 mg 166.7 mL/hr over 90 Minutes Intravenous Every 12 hours 06/30/23 0347     06/30/23 1400  piperacillin-tazobactam (ZOSYN) IVPB 3.375 g  Status:  Discontinued        3.375 g 12.5 mL/hr over 240 Minutes Intravenous Every 8 hours 06/30/23 0347 07/01/23 1250   06/30/23 0115  vancomycin (VANCOCIN) IVPB 1000 mg/200 mL premix        1,000 mg 200 mL/hr over 60 Minutes Intravenous  Once 06/30/23 0114 06/30/23 0407   06/30/23 0115  piperacillin-tazobactam (ZOSYN) IVPB 3.375 g        3.375 g 100 mL/hr over 30 Minutes Intravenous  Once 06/30/23 0114 06/30/23 0507       Medications: Scheduled Meds:  vitamin C  1,000 mg Oral Daily   Continuous Infusions:  vancomycin 1,250 mg (07/02/23 0450)   PRN Meds:.acetaminophen, HYDROmorphone (DILAUDID) injection, melatonin, oxyCODONE, polyethylene glycol, prochlorperazine    Objective: Weight change:   Intake/Output Summary (Last 24 hours) at 07/02/2023 1329 Last data filed at 07/02/2023 1000 Gross per 24 hour  Intake 620 ml  Output --  Net 620 ml   Blood pressure 101/73, pulse 65, temperature 98.1 F (36.7 C), temperature source Oral, resp. rate 20, height 5\' 9"  (1.753 m), weight 63.5 kg, SpO2 98%. Temp:  [97.9 F (36.6 C)-98.3 F (36.8 C)] 98.1 F (36.7 C) (03/23 0542) Pulse Rate:  [63-71] 65 (03/23 0542) Resp:  [20] 20 (03/23 0542) BP: (101-122)/(72-81) 101/73 (03/23 0542) SpO2:  [98 %-99 %] 98 % (03/23 0542)  Physical Exam: Physical Exam Constitutional:      Appearance: He is well-developed.  HENT:     Head: Normocephalic and  atraumatic.  Eyes:     Conjunctiva/sclera: Conjunctivae normal.  Cardiovascular:     Rate and Rhythm: Normal rate and regular rhythm.  Pulmonary:     Effort: Pulmonary effort is normal. No respiratory distress.     Breath sounds: Normal breath sounds. No wheezing.  Abdominal:     General: There is no distension.     Palpations: Abdomen is soft.  Musculoskeletal:        General: Normal range of motion.     Cervical back: Normal range of motion and neck supple.  Skin:    General: Skin is warm and dry.     Findings: No erythema or rash.  Neurological:     General: No focal deficit present.     Mental Status: He is alert and oriented to person, place, and time.  Psychiatric:        Mood and Affect: Mood normal.        Behavior: Behavior normal.        Thought Content: Thought content normal.        Judgment: Judgment normal.    Thumb bandaged  CBC:    BMET Recent Labs    06/30/23 0623 07/01/23 0848  NA 133* 137  K 4.4 3.9  CL 104 101  CO2 24 29  GLUCOSE 208* 103*  BUN 10 10  CREATININE 0.86 0.79  CALCIUM 8.9 8.9     Liver Panel  Recent Labs    07/01/23 0848  ALBUMIN 3.0*       Sedimentation Rate Recent Labs    07/02/23 0737  ESRSEDRATE 18*   C-Reactive Protein Recent Labs    07/02/23 0737  CRP 3.0*    Micro Results: Recent Results (from the past 720 hours)  Blood culture (routine x 2)     Status: None (Preliminary result)   Collection Time: 06/29/23 12:28 PM   Specimen: BLOOD RIGHT HAND  Result Value Ref Range Status   Specimen Description BLOOD RIGHT HAND  Final   Special Requests   Final    BOTTLES DRAWN AEROBIC AND ANAEROBIC Blood Culture adequate volume   Culture   Final    NO GROWTH 3 DAYS Performed at [facility], [address] [place], [place] [address]    Report Status PENDING  Incomplete  Blood culture (routine x 2)     Status: None (Preliminary result)   Collection Time: 06/29/23 12:33 PM   Specimen: BLOOD  Result  Value Ref Range Status   Specimen Description BLOOD LEFT ANTECUBITAL  Final   Special Requests   Final    BOTTLES DRAWN AEROBIC AND ANAEROBIC Blood Culture adequate volume   Culture   Final    NO GROWTH 2 DAYS Performed at [facility], [address] [place], [place] [address]    Report Status PENDING  Incomplete  Aerobic/Anaerobic Culture w Gram Stain (surgical/deep wound)     Status: None (Preliminary result)   Collection Time: 06/30/23  3:12 AM   Specimen: Finger, Right; Abscess  Result Value Ref Range Status   Specimen Description OTHER  Final   Special Requests Finger R  Final   Gram Stain   Final    RARE WBC PRESENT, PREDOMINANTLY PMN FEW GRAM POSITIVE COCCI Performed at [facility], [address], [place], [place] [address]    Culture   Final    ABUNDANT METHICILLIN RESISTANT STAPHYLOCOCCUS AUREUS   Report Status PENDING  Incomplete   Organism ID, Bacteria METHICILLIN RESISTANT STAPHYLOCOCCUS AUREUS  Final      Susceptibility   Methicillin resistant staphylococcus aureus - MIC*    CIPROFLOXACIN >=8 RESISTANT Resistant     ERYTHROMYCIN >=8 RESISTANT Resistant     GENTAMICIN <=0.5 SENSITIVE Sensitive     OXACILLIN >=4 RESISTANT Resistant     TETRACYCLINE <=1 SENSITIVE Sensitive     VANCOMYCIN <=0.5 SENSITIVE Sensitive     TRIMETH/SULFA >=320 RESISTANT Resistant     CLINDAMYCIN <=0.25 SENSITIVE Sensitive     RIFAMPIN <=0.5 SENSITIVE Sensitive     Inducible Clindamycin NEGATIVE Sensitive     LINEZOLID 2 SENSITIVE Sensitive     * ABUNDANT METHICILLIN RESISTANT STAPHYLOCOCCUS AUREUS    Studies/Results: No results found.    Assessment/Plan:  INTERVAL HISTORY: pt grew MRSA from culture   Principal Problem:   Osteomyelitis of finger (HCC) Active Problems:   Burn of right thumb   Abscess of right thumb    [name] is a 28 y.o. male with thumb abscess and osteomyelitis due to methicillin-resistant Staphylococcus aureus.  #1 MRSA  osteomyelitis of thumb:  Have narrowed to vancomycin while he is in the inpatient world.  As he improved can switch over to doxycycline 100 mg twice daily would make sure that he has sufficient antibiotics to get him through 42 days of therapy  using TOC  #2 Social issues: Eluding homelessness lack of phone and transportation hopefully case management can help him with some of these things.  3.  Screening for viral hepatitides shows him to be negative for hepatitis B and HIV he does not have immunity to hep A and we do not have his hep C data back but all of this will come back to me he would benefit from hepatitis A vaccination.  4. TDM  Lab Results  Component Value Date   CREATININE 0.79 07/01/2023   CREATININE 0.86 06/30/2023   CREATININE 0.73 06/29/2023    5 IP: contact precautions  I have personally spent 50 minutes involved in face-to-face and non-face-to-face activities for this patient on the day of the visit. Professional time spent includes the following activities: Preparing to see the patient (review of tests), Obtaining and/or reviewing separately obtained history (admission/discharge record), Performing a medically appropriate examination and/or evaluation , Ordering medications/tests/procedures, referring and communicating with other health care professionals, Documenting clinical information in the EMR, Independently interpreting results (not separately reported), Communicating results to the patient/family/caregiver, Counseling and educating the patient/family/caregiver and Care coordination (not separately reported).   Evaluation of the patient requires complex antimicrobial therapy evaluation, counseling , isolation needs to reduce disease transmission and risk assessment and mitigation.    [name] has an appointment on 08/07/2023 at [facility] with Dr. Daiva Eves at  [facility], which  is located in the [facility] at  [address] [place] in [place].  [address], which is located to the left of the elevators.  Phone: [phone]  Fax: [phone]  [email or web address]  The patient should arrive 30 minutes prior to their appoitment.   I will sign off for now please call with further questions.   LOS: 2 days   Acey Lav 07/02/2023, 1:29 PM

## 2023-07-03 ENCOUNTER — Other Ambulatory Visit (HOSPITAL_COMMUNITY): Payer: Self-pay

## 2023-07-03 ENCOUNTER — Encounter (HOSPITAL_COMMUNITY): Payer: Self-pay | Admitting: Internal Medicine

## 2023-07-03 LAB — MRSA NEXT GEN BY PCR, NASAL: MRSA by PCR Next Gen: DETECTED — AB

## 2023-07-03 MED ORDER — ASCORBIC ACID 1000 MG PO TABS
1000.0000 mg | ORAL_TABLET | Freq: Every day | ORAL | 1 refills | Status: DC
  Filled 2023-07-03: qty 30, 30d supply, fill #0

## 2023-07-03 MED ORDER — NICOTINE 14 MG/24HR TD PT24
14.0000 mg | MEDICATED_PATCH | Freq: Every day | TRANSDERMAL | 0 refills | Status: DC
  Filled 2023-07-03: qty 28, 28d supply, fill #0

## 2023-07-03 MED ORDER — DOXYCYCLINE HYCLATE 100 MG PO TABS
100.0000 mg | ORAL_TABLET | Freq: Two times a day (BID) | ORAL | 0 refills | Status: DC
  Filled 2023-07-03: qty 60, 30d supply, fill #0
  Filled 2023-07-04: qty 24, 12d supply, fill #1

## 2023-07-03 MED ORDER — OXYCODONE HCL 5 MG PO TABS
5.0000 mg | ORAL_TABLET | Freq: Four times a day (QID) | ORAL | 0 refills | Status: DC | PRN
  Filled 2023-07-03: qty 30, 8d supply, fill #0

## 2023-07-03 MED ORDER — POLYETHYLENE GLYCOL 3350 17 GM/SCOOP PO POWD
17.0000 g | Freq: Every day | ORAL | 0 refills | Status: DC | PRN
  Filled 2023-07-03: qty 238, 14d supply, fill #0

## 2023-07-03 NOTE — Progress Notes (Signed)
 PROGRESS NOTE    [name]  GNF:[id number] DOB: 1995/06/07 DOA: 06/29/2023 PCP: Patient, No Pcp Per  Outpatient Specialists:     Brief Narrative:  Patient is a 28 year old male, with past medical history significant for cardiomyopathy of unknown etiology, homelessness, polysubstance abuse and former smoker.  Patient was admitted with right thumb abscess and osteomyelitis.  Patient has undergone incision and drainage by the hand surgeon.  Wound culture is growing Staphylococcus aureus.  Infectious disease team has been consulted to assist with patient's antibiotics regimen.  07/02/2023: Patient seen.  Wound culture has grown MRSA.  Patient is on IV vancomycin.  IV cefazolin has been discontinued.  TOC consult to assist with discharge.  Patient be discharged on doxycycline for 6 weeks.  Patient is homeless.  07/03/2023: Patient seen.  Input from the case management is highly appreciated (please see documentation).  Patient is homeless.  Patient will likely be discharged tomorrow.  Antibiotics will be provided to the patient.  Dressing metroplasty be provided to the patient.  Patient will follow-up with hand surgery team as well as primary care provider.  Assessment & Plan:   Principal Problem:   Osteomyelitis of finger (HCC) Active Problems:   Burn of right thumb   Abscess of right thumb   Right thumb abscess and osteomyelitis: -Status post Incision and drainage right thumb felon with debridement of osteomyelitis  -Wound culture is growing Staphylococcus aureus. -Infectious disease team input is highly appreciated. -Continue IV vancomycin. -Discontinue IV cefazolin. -Doxycycline on discharge (oral) -Input from the hand surgeon is highly appreciated. -Postop care as per hand surgery team.  Elevated blood sugar: -Check A1c.  Hypophosphatemia: -Last phosphorus of 3.4.  DVT prophylaxis: SCD. Code Status: Full code. Family Communication:  Disposition Plan: Patient is  inpatient.   Consultants:  Surgery team.  Procedures:  Incision and drainage.  Antimicrobials:  IV vancomycin. IV Zosyn.   Subjective: No new complaints.  Objective: Vitals:   07/02/23 2004 07/03/23 0540 07/03/23 0804 07/03/23 1736  BP: 121/77 109/79 113/83 105/63  Pulse: 90 68 74 84  Resp: 18 18  18   Temp: 98.8 F (37.1 C) 97.6 F (36.4 C) 97.9 F (36.6 C) 97.8 F (36.6 C)  TempSrc: Oral Oral Oral Oral  SpO2: 99% 98% 98% 100%  Weight:      Height:        Intake/Output Summary (Last 24 hours) at 07/03/2023 1912 Last data filed at 07/03/2023 1513 Gross per 24 hour  Intake 244.31 ml  Output --  Net 244.31 ml   Filed Weights   06/29/23 1159  Weight: 63.5 kg    Examination:  General exam: Appears calm and comfortable.  Right thumb is wrapped. Respiratory system: Clear to auscultation.  Cardiovascular system: S1 & S2  Gastrointestinal system: Abdomen is soft and nontender.   Central nervous system: Awake and alert.  Patient moves all extremities.   Extremities: Right thumb base wrapped.  Data Reviewed: I have personally reviewed following labs and imaging studies  CBC: Recent Labs  Lab 06/29/23 1228 06/30/23 0623 07/01/23 0848 07/02/23 0737  WBC 10.1 10.7* 11.2* 7.3  NEUTROABS 7.4  --  6.7  --   HGB 14.2 13.0 13.6 14.3  HCT 40.3 38.4* 39.9 41.6  MCV 87.2 88.3 89.1 87.6  PLT PLATELET CLUMPS NOTED ON SMEAR, UNABLE TO ESTIMATE PLATELET CLUMPS NOTED ON SMEAR, UNABLE TO ESTIMATE 164 185   Basic Metabolic Panel: Recent Labs  Lab 06/29/23 1228 06/30/23 0623 07/01/23 0848  NA 137  133* 137  K 3.8 4.4 3.9  CL 103 104 101  CO2 25 24 29   GLUCOSE 110* 208* 103*  BUN 9 10 10   CREATININE 0.73 0.86 0.79  CALCIUM 9.5 8.9 8.9  MG  --  2.0 1.9  PHOS  --  2.2* 3.4   GFR: Estimated Creatinine Clearance: 122.4 mL/min (by C-G formula based on SCr of 0.79 mg/dL). Liver Function Tests: Recent Labs  Lab 06/29/23 1228 07/01/23 0848  AST 21  --   ALT 20   --   ALKPHOS 56  --   BILITOT 1.0  --   PROT 7.0  --   ALBUMIN 3.7 3.0*   No results for input(s): "LIPASE", "AMYLASE" in the last 168 hours. No results for input(s): "AMMONIA" in the last 168 hours. Coagulation Profile: No results for input(s): "INR", "PROTIME" in the last 168 hours. Cardiac Enzymes: No results for input(s): "CKTOTAL", "CKMB", "CKMBINDEX", "TROPONINI" in the last 168 hours. BNP (last 3 results) No results for input(s): "PROBNP" in the last 8760 hours. HbA1C: No results for input(s): "HGBA1C" in the last 72 hours. CBG: No results for input(s): "GLUCAP" in the last 168 hours. Lipid Profile: No results for input(s): "CHOL", "HDL", "LDLCALC", "TRIG", "CHOLHDL", "LDLDIRECT" in the last 72 hours. Thyroid Function Tests: No results for input(s): "TSH", "T4TOTAL", "FREET4", "T3FREE", "THYROIDAB" in the last 72 hours. Anemia Panel: No results for input(s): "VITAMINB12", "FOLATE", "FERRITIN", "TIBC", "IRON", "RETICCTPCT" in the last 72 hours. Urine analysis:    Component Value Date/Time   COLORURINE YELLOW 05/19/2018 1125   APPEARANCEUR HAZY (A) 05/19/2018 1125   LABSPEC 1.016 05/19/2018 1125   PHURINE 7.0 05/19/2018 1125   GLUCOSEU NEGATIVE 05/19/2018 1125   HGBUR NEGATIVE 05/19/2018 1125   BILIRUBINUR NEGATIVE 05/19/2018 1125   KETONESUR NEGATIVE 05/19/2018 1125   PROTEINUR NEGATIVE 05/19/2018 1125   UROBILINOGEN 1.0 12/18/2014 1430   NITRITE NEGATIVE 05/19/2018 1125   LEUKOCYTESUR NEGATIVE 05/19/2018 1125   Sepsis Labs: @LABRCNTIP (procalcitonin:4,lacticidven:4)  ) Recent Results (from the past 240 hours)  Blood culture (routine x 2)     Status: None (Preliminary result)   Collection Time: 06/29/23 12:28 PM   Specimen: BLOOD RIGHT HAND  Result Value Ref Range Status   Specimen Description BLOOD RIGHT HAND  Final   Special Requests   Final    BOTTLES DRAWN AEROBIC AND ANAEROBIC Blood Culture adequate volume   Culture   Final    NO GROWTH 4 DAYS Performed  at [facility], [address], [place], [place] [address]    Report Status PENDING  Incomplete  Blood culture (routine x 2)     Status: None (Preliminary result)   Collection Time: 06/29/23 12:33 PM   Specimen: BLOOD  Result Value Ref Range Status   Specimen Description BLOOD LEFT ANTECUBITAL  Final   Special Requests   Final    BOTTLES DRAWN AEROBIC AND ANAEROBIC Blood Culture adequate volume   Culture   Final    NO GROWTH 3 DAYS Performed at [facility], [address] [place], [place] [address]    Report Status PENDING  Incomplete  Aerobic/Anaerobic Culture w Gram Stain (surgical/deep wound)     Status: None (Preliminary result)   Collection Time: 06/30/23  3:12 AM   Specimen: Finger, Right; Abscess  Result Value Ref Range Status   Specimen Description OTHER  Final   Special Requests Finger R  Final   Gram Stain   Final    RARE WBC PRESENT,  PREDOMINANTLY PMN FEW GRAM POSITIVE COCCI Performed at [facility], [address] [place], [place] [address]    Culture   Final    ABUNDANT METHICILLIN RESISTANT STAPHYLOCOCCUS AUREUS NO ANAEROBES ISOLATED; CULTURE IN PROGRESS FOR 5 DAYS    Report Status PENDING  Incomplete   Organism ID, Bacteria METHICILLIN RESISTANT STAPHYLOCOCCUS AUREUS  Final      Susceptibility   Methicillin resistant staphylococcus aureus - MIC*    CIPROFLOXACIN >=8 RESISTANT Resistant     ERYTHROMYCIN >=8 RESISTANT Resistant     GENTAMICIN <=0.5 SENSITIVE Sensitive     OXACILLIN >=4 RESISTANT Resistant     TETRACYCLINE <=1 SENSITIVE Sensitive     VANCOMYCIN <=0.5 SENSITIVE Sensitive     TRIMETH/SULFA >=320 RESISTANT Resistant     CLINDAMYCIN <=0.25 SENSITIVE Sensitive     RIFAMPIN <=0.5 SENSITIVE Sensitive     Inducible Clindamycin NEGATIVE Sensitive     LINEZOLID 2 SENSITIVE Sensitive     * ABUNDANT METHICILLIN RESISTANT STAPHYLOCOCCUS AUREUS  MRSA Next Gen by PCR, Nasal     Status: Abnormal   Collection Time: 07/03/23  1:39 PM   Result Value Ref Range Status   MRSA by PCR Next Gen DETECTED (A) NOT DETECTED Final    Comment: RESULT CALLED TO, READ BACK BY AND VERIFIED WITH: RN Alvina Filbert [phone] @ 1645 FH (NOTE) The GeneXpert MRSA Assay (FDA approved for NASAL specimens only), is one component of a comprehensive MRSA colonization surveillance program. It is not intended to diagnose MRSA infection nor to guide or monitor treatment for MRSA infections. Test performance is not FDA approved in patients less than 67 years old. Performed at [facility], [address], [place], [place] [address]          Radiology Studies: No results found.       Scheduled Meds:  vitamin C  1,000 mg Oral Daily   nicotine  14 mg Transdermal Daily   Continuous Infusions:  vancomycin Stopped (07/03/23 0954)     LOS: 3 days    Time spent: 35 minutes.    Berton Mount, MD  [facility] Pager #: [phone] 7PM-7AM contact night coverage as above

## 2023-07-03 NOTE — Plan of Care (Signed)

## 2023-07-03 NOTE — Progress Notes (Signed)
 MATCH MEDICATION ASSISTANCE CARD Pharmacies please call: [phone] for claim processing assistance.  Rx BIN: [id number] Rx Group: [id number] Rx PCN: [id number] Relationship Code: 1 Person Code: [id number]  Patient ID (MRN): [id number]    Patient Name: [name]    Patient DOB: 12-01-1995    Discharge Date: 07/03/2023    Expiration Date: 07/10/2023 (must be filled within 7 days of discharge)   Dear Mr. [name] You have been approved to have the prescriptions written by your discharging physician filled through our [facility] (Medication Assistance Through [facility]) program. This program allows for a one-time (no refills) 34-day supply of selected medications for a low copay amount.  The copay is $3.00 per prescription. For instance, if you have one prescription, you will pay $3.00; for two prescriptions, you pay $6.00; for three prescriptions, you pay $9.00; and so on. Only certain pharmacies are participating in this program with [facility]. You will need to select one of the pharmacies from the attached lists and take your prescriptions, this letter, and your photo ID to one of the participating pharmacies.  We are excited that you are able to use the [facility] program to get your medications. These prescriptions must be filled within 7 days of hospital discharge or they will no longer be valid for the [facility] program. Should you have any problems with your prescriptions please contact your case management team member at [phone] for [facility] or [phone] for [facility].  Thank you, [facility]

## 2023-07-03 NOTE — TOC Progression Note (Signed)
 Transition of Care [facility]) - Progression Note    Patient Details  Name: [name] MRN: [id number] Date of Birth: Nov 22, 1995  Transition of [facility] CM/SW Contact  Janae Bridgeman, RN Phone Number: 07/03/2023, 3:53 PM  Clinical Narrative:    CM met with the patient at the bedside to discuss patient's return to the community tomorrow.  Patient has no family to assist with shelter and likely plans to return to an abandoned house where he has been staying with no running water.  Patient was provided with shelter resources.  Patient states that he has been banned from the [employer] for 1 year.  Patient is aware that he is being discharged back to the community tomorrow.  Patient was provided with 5 bus passes for appoints in the community.  Patient needs 6 weeks of oral antibiotics.  MATCH was provided and patient will follow up at the [facility] pharmacy after 4 weeks to have the last 2 weeks filled - TOC pharmacist is aware.  I called [facility] and they are not accepting new patient.  Hospital follow up was [facility] with [facility] on July 10, 2023 at 1:40 since patient does not have a working cell phone.  Patient was aware that I was going to make follow up appointment for him.  Bedside nursing will provide the patient with dressing supplies when he is discharged.  Patient plans to use the public bathrooms in the community to wash his hands and change his thumb dressing.  No other TOC needs at this time and patient plans to discharge to the community tomorrow.  Shelter resources provided at the bedside.   Expected Discharge Plan: Homeless Shelter Barriers to Discharge: No Barriers Identified (Patient will be discharge to community tomorrow)  Expected Discharge Plan and Services   Discharge Planning Services: CM Consult Post Acute Care Choice: Resumption of Svcs/PTA Provider Living arrangements for the past 2 months: Homeless                                        Social Determinants of Health (SDOH) Interventions SDOH Screenings   Food Insecurity: Food Insecurity Present (06/30/2023)  Housing: High Risk (06/30/2023)  Transportation Needs: Unmet Transportation Needs (06/30/2023)  Utilities: At Risk (06/30/2023)  Social Connections: Unknown (08/24/2021)   Received from [facility]  Tobacco Use: Medium Risk (07/03/2023)    Readmission Risk Interventions    07/03/2023    3:53 PM  Readmission Risk Prevention Plan  Post Dischage Appt Complete  Medication Screening Complete  Transportation Screening Complete
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
## 2023-07-04 ENCOUNTER — Other Ambulatory Visit (HOSPITAL_COMMUNITY): Payer: Self-pay

## 2023-07-04 LAB — HCV AB W REFLEX TO QUANT PCR: HCV Ab: NONREACTIVE

## 2023-07-04 LAB — CULTURE, BLOOD (ROUTINE X 2)
Culture: NO GROWTH
Special Requests: ADEQUATE

## 2023-07-04 LAB — HEPATITIS B SURFACE ANTIBODY, QUANTITATIVE: Hep B S AB Quant (Post): 3.6 m[IU]/mL — ABNORMAL LOW

## 2023-07-04 LAB — HCV INTERPRETATION

## 2023-07-04 MED ORDER — HYDROMORPHONE HCL 1 MG/ML IJ SOLN
1.0000 mg | Freq: Once | INTRAMUSCULAR | Status: AC
  Administered 2023-07-04: 1 mg via INTRAVENOUS
  Filled 2023-07-04: qty 1

## 2023-07-04 NOTE — Progress Notes (Signed)
 [facility] night cross cover note:   I was notified by RN that the patient is contemplating leaving the hospital AMA.   I subsequently spoke with the patient at bedside. I strongly encouraged him to stay in the hospital overnight, and that if he did leave overnight, that it would have to be Against Medical Advice. I described to him the risks of leaving AMA including worsening infxn related to his osteomyelitis of the thumb, with the risk of leaving AMA also including increased risk for death. I described to him that, if he leaves AMA, that I won't be able to provide him with any scripts for abx or pain meds, and that it appears, per current treatment plan, he is to be on 6 weeks of abx.   Following these discussions, the patient verbalizes his understanding of the above, and conveys that he is amenable to staying. He is interested in attempting to further optimize his pain control related to his thumb and is okay with getting a new iv in order to help pursue this. I've ordered dilaudid 1 mg iv x 1 now. This is in addition to his existing order for dilaudid 0.5 mg iv 4 hours prn.      Newton Pigg, DO Hospitalist

## 2023-07-04 NOTE — Progress Notes (Signed)
 PT Cancellation Note  Patient Details Name: [name] MRN: [id number] DOB: 09/06/1995   Cancelled Treatment:    Reason Eval/Treat Not Completed: Discussed pt case with RN who reports pt requested she change the dressing this morning. Wound therapy will not re-dress operative sites today.    Marylynn Pearson 07/04/2023, 10:38 AM  Conni Slipper, PT, DPT Acute Rehabilitation Services Secure Chat Preferred Office: [phone]
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
## 2023-07-04 NOTE — Progress Notes (Signed)
 Pt demanding his Rx's and wanting to leave. Pt ripped out PIV. Primary RN and MD aware.

## 2023-07-05 LAB — AEROBIC/ANAEROBIC CULTURE W GRAM STAIN (SURGICAL/DEEP WOUND)

## 2023-07-05 LAB — CULTURE, BLOOD (ROUTINE X 2)
Culture: NO GROWTH
Special Requests: ADEQUATE

## 2023-07-06 ENCOUNTER — Other Ambulatory Visit: Payer: Self-pay

## 2023-07-06 ENCOUNTER — Emergency Department (HOSPITAL_COMMUNITY)
Admission: EM | Admit: 2023-07-06 | Discharge: 2023-07-06 | Disposition: A | Discharge status: Home / Self Care | Payer: Self-pay | Attending: Emergency Medicine | Admitting: Emergency Medicine

## 2023-07-06 ENCOUNTER — Encounter (HOSPITAL_COMMUNITY): Payer: Self-pay | Admitting: Emergency Medicine

## 2023-07-06 ENCOUNTER — Ambulatory Visit (HOSPITAL_COMMUNITY)
Admission: EM | Admit: 2023-07-06 | Discharge: 2023-07-07 | Disposition: A | Discharge status: Home / Self Care | Attending: Psychiatry | Admitting: Psychiatry

## 2023-07-06 DIAGNOSIS — Z5900 Homelessness unspecified: Secondary | ICD-10-CM | POA: Insufficient documentation

## 2023-07-06 DIAGNOSIS — F1510 Other stimulant abuse, uncomplicated: Secondary | ICD-10-CM | POA: Diagnosis present

## 2023-07-06 DIAGNOSIS — F1990 Other psychoactive substance use, unspecified, uncomplicated: Secondary | ICD-10-CM

## 2023-07-06 DIAGNOSIS — F1514 Other stimulant abuse with stimulant-induced mood disorder: Secondary | ICD-10-CM | POA: Insufficient documentation

## 2023-07-06 DIAGNOSIS — F1290 Cannabis use, unspecified, uncomplicated: Secondary | ICD-10-CM | POA: Insufficient documentation

## 2023-07-06 DIAGNOSIS — F418 Other specified anxiety disorders: Secondary | ICD-10-CM | POA: Insufficient documentation

## 2023-07-06 DIAGNOSIS — F1110 Opioid abuse, uncomplicated: Secondary | ICD-10-CM | POA: Insufficient documentation

## 2023-07-06 DIAGNOSIS — I4510 Unspecified right bundle-branch block: Secondary | ICD-10-CM | POA: Diagnosis not present

## 2023-07-06 DIAGNOSIS — F1010 Alcohol abuse, uncomplicated: Secondary | ICD-10-CM | POA: Diagnosis not present

## 2023-07-06 DIAGNOSIS — J45909 Unspecified asthma, uncomplicated: Secondary | ICD-10-CM | POA: Insufficient documentation

## 2023-07-06 DIAGNOSIS — Z79899 Other long term (current) drug therapy: Secondary | ICD-10-CM | POA: Diagnosis not present

## 2023-07-06 DIAGNOSIS — F1910 Other psychoactive substance abuse, uncomplicated: Secondary | ICD-10-CM

## 2023-07-06 LAB — COMPREHENSIVE METABOLIC PANEL WITH GFR
ALT: 41 U/L (ref 0–44)
ALT: 46 U/L — ABNORMAL HIGH (ref 0–44)
AST: 34 U/L (ref 15–41)
AST: 37 U/L (ref 15–41)
Albumin: 3.7 g/dL (ref 3.5–5.0)
Albumin: 3.7 g/dL (ref 3.5–5.0)
Alkaline Phosphatase: 51 U/L (ref 38–126)
Alkaline Phosphatase: 52 U/L (ref 38–126)
Anion gap: 8 (ref 5–15)
Anion gap: 8 (ref 5–15)
BUN: 20 mg/dL (ref 6–20)
BUN: 18 mg/dL (ref 6–20)
CO2: 30 mmol/L (ref 22–32)
CO2: 30 mmol/L (ref 22–32)
Calcium: 9.8 mg/dL (ref 8.9–10.3)
Calcium: 9.7 mg/dL (ref 8.9–10.3)
Chloride: 99 mmol/L (ref 98–111)
Chloride: 102 mmol/L (ref 98–111)
Creatinine, Ser: 0.82 mg/dL (ref 0.61–1.24)
Creatinine, Ser: 0.86 mg/dL (ref 0.61–1.24)
GFR, Estimated: >60 mL/min (ref >60)
GFR, Estimated: >60 mL/min (ref >60)
Glucose, Bld: 92 mg/dL (ref 70–99)
Glucose, Bld: 68 mg/dL — ABNORMAL LOW (ref 70–99)
Potassium: 4 mmol/L (ref 3.5–5.1)
Potassium: 4.3 mmol/L (ref 3.5–5.1)
Sodium: 137 mmol/L (ref 135–145)
Sodium: 140 mmol/L (ref 135–145)
Total Bilirubin: 0.8 mg/dL (ref 0.0–1.2)
Total Bilirubin: 0.6 mg/dL (ref 0.0–1.2)
Total Protein: 7.1 g/dL (ref 6.5–8.1)
Total Protein: 6.9 g/dL (ref 6.5–8.1)

## 2023-07-06 LAB — LIPID PANEL
Cholesterol: 169 mg/dL (ref 0–200)
HDL: 56 mg/dL (ref >40)
LDL Cholesterol: 105 mg/dL — ABNORMAL HIGH (ref 0–99)
Total CHOL/HDL Ratio: 3 ratio
Triglycerides: 42 mg/dL (ref <150)
VLDL: 8 mg/dL (ref 0–40)

## 2023-07-06 LAB — CBC WITH DIFFERENTIAL/PLATELET
Abs Immature Granulocytes: 0.02 10*3/uL (ref 0.00–0.07)
Basophils Absolute: 0.1 10*3/uL (ref 0.0–0.1)
Basophils Relative: 1 %
Eosinophils Absolute: 0.1 10*3/uL (ref 0.0–0.5)
Eosinophils Relative: 2 %
HCT: 40.6 % (ref 39.0–52.0)
Hemoglobin: 14.1 g/dL (ref 13.0–17.0)
Immature Granulocytes: 0 %
Lymphocytes Relative: 31 %
Lymphs Abs: 2.1 10*3/uL (ref 0.7–4.0)
MCH: 30.6 pg (ref 26.0–34.0)
MCHC: 34.7 g/dL (ref 30.0–36.0)
MCV: 88.1 fL (ref 80.0–100.0)
Monocytes Absolute: 1 10*3/uL (ref 0.1–1.0)
Monocytes Relative: 15 %
Neutro Abs: 3.5 10*3/uL (ref 1.7–7.7)
Neutrophils Relative %: 51 %
Platelets: 289 10*3/uL (ref 150–400)
RBC: 4.61 MIL/uL (ref 4.22–5.81)
RDW: 12 % (ref 11.5–15.5)
WBC: 6.9 10*3/uL (ref 4.0–10.5)
nRBC: 0 % (ref 0.0–0.2)

## 2023-07-06 LAB — URINALYSIS, COMPLETE (UACMP) WITH MICROSCOPIC
Bilirubin Urine: NEGATIVE
Glucose, UA: 50 mg/dL — AB
Hgb urine dipstick: NEGATIVE
Ketones, ur: NEGATIVE mg/dL
Leukocytes,Ua: NEGATIVE
Nitrite: NEGATIVE
Protein, ur: NEGATIVE mg/dL
Specific Gravity, Urine: 1.025 (ref 1.005–1.030)
pH: 5 (ref 5.0–8.0)

## 2023-07-06 LAB — RAPID URINE DRUG SCREEN, HOSP PERFORMED
Amphetamines: POSITIVE — AB
Barbiturates: NOT DETECTED
Benzodiazepines: NOT DETECTED
Cocaine: NOT DETECTED
Opiates: NOT DETECTED
Tetrahydrocannabinol: NOT DETECTED

## 2023-07-06 LAB — POCT URINE DRUG SCREEN - MANUAL ENTRY (I-SCREEN)
POC Amphetamine UR: POSITIVE — AB
POC Buprenorphine (BUP): NOT DETECTED
POC Cocaine UR: NOT DETECTED
POC Marijuana UR: NOT DETECTED
POC Methadone UR: NOT DETECTED
POC Methamphetamine UR: POSITIVE — AB
POC Morphine: NOT DETECTED
POC Oxazepam (BZO): NOT DETECTED
POC Oxycodone UR: POSITIVE — AB
POC Secobarbital (BAR): NOT DETECTED

## 2023-07-06 LAB — CBC
HCT: 41.2 % (ref 39.0–52.0)
Hemoglobin: 13.7 g/dL (ref 13.0–17.0)
MCH: 29.6 pg (ref 26.0–34.0)
MCHC: 33.3 g/dL (ref 30.0–36.0)
MCV: 89 fL (ref 80.0–100.0)
Platelets: 325 10*3/uL (ref 150–400)
RBC: 4.63 MIL/uL (ref 4.22–5.81)
RDW: 12 % (ref 11.5–15.5)
WBC: 8.7 10*3/uL (ref 4.0–10.5)
nRBC: 0 % (ref 0.0–0.2)

## 2023-07-06 LAB — ETHANOL
Alcohol, Ethyl (B): <10 mg/dL (ref <10)
Alcohol, Ethyl (B): <10 mg/dL (ref <10)

## 2023-07-06 LAB — TSH: TSH: 0.431 u[IU]/mL (ref 0.350–4.500)

## 2023-07-06 MED ORDER — TRAZODONE HCL 50 MG PO TABS
50.0000 mg | ORAL_TABLET | Freq: Every evening | ORAL | Status: DC | PRN
  Filled 2023-07-06: qty 1

## 2023-07-06 MED ORDER — DIPHENHYDRAMINE HCL 50 MG/ML IJ SOLN: 50.0000 mg | Freq: Three times a day (TID) | INTRAMUSCULAR | Status: DC | PRN

## 2023-07-06 MED ORDER — DOXYCYCLINE HYCLATE 100 MG PO TABS
100.0000 mg | ORAL_TABLET | Freq: Two times a day (BID) | ORAL | Status: AC
  Administered 2023-07-06: 100 mg via ORAL
  Filled 2023-07-06: qty 1

## 2023-07-06 MED ORDER — MAGNESIUM HYDROXIDE 400 MG/5ML PO SUSP: 30.0000 mL | Freq: Every day | ORAL | Status: DC | PRN

## 2023-07-06 MED ORDER — VITAMIN C 500 MG PO TABS
1000.0000 mg | ORAL_TABLET | Freq: Every day | ORAL | Status: DC
Start: 2023-07-06 — End: 2023-07-07
  Filled 2023-07-06 (×2): qty 2

## 2023-07-06 MED ORDER — LORAZEPAM 2 MG/ML IJ SOLN: 2.0000 mg | Freq: Three times a day (TID) | INTRAMUSCULAR | Status: DC | PRN

## 2023-07-06 MED ORDER — DIPHENHYDRAMINE HCL 50 MG PO CAPS: 50.0000 mg | ORAL_CAPSULE | Freq: Three times a day (TID) | ORAL | Status: DC | PRN

## 2023-07-06 MED ORDER — ACETAMINOPHEN 325 MG PO TABS: 650.0000 mg | ORAL_TABLET | Freq: Four times a day (QID) | ORAL | Status: DC | PRN

## 2023-07-06 MED ORDER — ALUM & MAG HYDROXIDE-SIMETH 200-200-20 MG/5ML PO SUSP
30.0000 mL | ORAL | Status: DC | PRN
  Filled 2023-07-06: qty 30

## 2023-07-06 MED ORDER — HALOPERIDOL LACTATE 5 MG/ML IJ SOLN: 10.0000 mg | Freq: Three times a day (TID) | INTRAMUSCULAR | Status: DC | PRN

## 2023-07-06 MED ORDER — HALOPERIDOL 5 MG PO TABS: 5.0000 mg | ORAL_TABLET | Freq: Three times a day (TID) | ORAL | Status: DC | PRN

## 2023-07-06 MED ORDER — HALOPERIDOL LACTATE 5 MG/ML IJ SOLN: 5.0000 mg | Freq: Three times a day (TID) | INTRAMUSCULAR | Status: DC | PRN

## 2023-07-06 MED ORDER — POLYETHYLENE GLYCOL 3350 17 G PO PACK
17.0000 g | PACK | Freq: Every day | ORAL | Status: DC
  Filled 2023-07-06 (×2): qty 1

## 2023-07-06 MED ORDER — NICOTINE 21 MG/24HR TD PT24
21.0000 mg | MEDICATED_PATCH | Freq: Every day | TRANSDERMAL | Status: DC
  Administered 2023-07-07: 21 mg via TRANSDERMAL
  Filled 2023-07-06 (×2): qty 1
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
## 2023-07-06 NOTE — ED Notes (Signed)
Pt given bag lunch and drink

## 2023-07-06 NOTE — Discharge Instructions (Signed)
.  Adult Mental Health Resources  [facility] Address: [address] [place], [place] [address] Phone: [phone]  [facility], [facility] Address: [address] [place], [place] [address] Hours:  Closed ? Opens 8?AM Mon Phone: [phone]  [facility] Address: [address], [place], [place] [address] Phone: [phone]  [facility], [facility] Address: [address], [place], [place] [address] Phone: [phone]  [facility] Address: [place] [facility], [address], [place], [place] [address] Hours:  Closed ? Opens 8?AM Mon Phone: [phone]  [facility] Address: [address] [place], [place], [place] [address] Phone: [phone] INTAKE: [phone] Ext [phone] Counseling Group Address: [address] [place], [place] [address] Phone: [phone]  [facility] Address: [address], [place], [place] [address] Phone: [phone]  [facility] Address: [address], [place], [place] [address] Phone: [phone]  [facility] Address: [address] [place], [place] [address] Phone: [phone]  [facility] Phone: [phone]   [facility]  Address: [address] [place], [place] [address] Hours: Open 24 hours Phone: [phone]
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
## 2023-07-06 NOTE — ED Notes (Signed)
 Pt sleeping@this  time breathing even and unlabored no pain or distress noted will continue to monitor for safety

## 2023-07-06 NOTE — ED Provider Notes (Signed)
 Behavioral Health Urgent Care Medical Screening Exam  Patient Name: [name] MRN: [id number] Date of Evaluation: 07/06/23 Chief Complaint:  Homelessness Diagnosis:  Final diagnoses:  None    History of Present illness: [name] is a 28 y.o. homeless male, who presents with his luggages unaccompanied to [facility] behavioral health urgent care for complaint of homelessness and needing a place to stay for 2 days in the context of polysubstance abuse of heroin, methamphetamine, alcohol, and occasional cocaine use.  He has prior psychiatric diagnoses of anxiety and has a past medical history of osteomyelitis and polysubstance abuse.   Patient was seen and examined face-to-face sitting up in a chair in the assessment.  He appears disheveled, unkempt and malodorous.  Chart reviewed indicate patient being seen and discharged from [facility] ED today and psychiatric follow-up to: -Follow-up with [facility] urgent care facility -Consideration for [facility] to establish MAT program -And/or chemical dependency outpatient program   Per initial admission assessment note; "[name] is a 28 y.o. male with a history of asthma, cardiomyopathy, and homelessness who presents the ED today for mental health concerns.  Patient is requesting to talk with social work and our psychiatric team.  He states that whenever he is on the streets he is self-destructive and does a lot of drugs and gets in fights with people.  Last used meth and fentanyl tonight.  He was admitted in the hospital the other week for osteomyelitis and states that he was "better mind space" then. He denies any SI, HI, or hallucinations.  No additional complaints or concerns at this time"   Patient was seen and evaluated face-to-face by this provider.  He reports he is needing help with fentanyl and methamphetamine use.  States he last used yesterday prior to his arrival.  Denied that he is ever been treated in a  residential treatment program.  Patient then became irritable stating " I just need help" multiple attempts to assess patient for detailed follow-up services.    Objective: Patient seen and examined in the assessment room sitting up in a chair.  He appears disheveled, unkempt and malodorous.  Staff report patient presenting with his luggages as if moving into [facility] behavioral health urgent care.  Patient reports, "I am homeless, I use a lot of substances on the street, I need a place to stay for 2 days until, I get back on the street.  People out there on the streets are getting on my nerves and I feel like I want to 'fuck' all of them.  Speech is circumstantial, illogical, with increased volume.  Eye contact is minimal.  Patient is not responding to internal or external stimuli, however, appears to be under the influence of drugs or alcohol.  He denies SI, HI, or AVH.  He appears very angry however, denies symptoms of anxiety, depression.  Report he has been banned from [facility], but failed to provide the reason.  He endorses sleeping for 4 hours last night.  Vital signs reviewed without critical values.  Patient is admitted to observation unit for mood stabilization, medication management, and safety, pending bed availability asked [facility].  Flowsheet Row ED from 07/06/2023 in [facility] Most recent reading at 07/06/2023  4:43 PM ED from 07/06/2023 in [facility] Emergency Department at [facility] Most recent reading at 07/06/2023  2:29 AM ED to Hosp-Admission (Discharged) from 06/29/2023 in [place] [facility] Medical Unit Most recent reading  at 06/29/2023 12:00 PM  C-SSRS RISK CATEGORY No Risk No Risk No Risk       Psychiatric Specialty Exam  Presentation  General Appearance:Disheveled; Bizarre  Eye Contact:Minimal  Speech:Garbled; Slurred  Speech Volume:Increased  Handedness:-- (Unable to assess as)  Mood and Affect   Mood: Angry  Affect: Constricted  Thought Process  Thought Processes: Linear; Disorganized  Descriptions of Associations:Tangential  Orientation:Partial  Thought Content:Illusions; Rumination; Scattered; Tangential    Hallucinations:None  Ideas of Reference:None  Suicidal Thoughts:No  Homicidal Thoughts:No  Sensorium  Memory: Immediate Poor; Recent Poor  Judgment: Poor  Insight: Poor  Executive Functions  Concentration: Fair  Attention Span: Fair  Recall: Poor  Fund of Knowledge: Poor  Language: Poor  Psychomotor Activity  Psychomotor Activity: Increased  Assets  Assets: -- (Homelessness)  Sleep  Sleep:No data recorded Number of hours:  4  Physical Exam: Physical Exam Vitals and nursing note reviewed.  Constitutional:      Appearance: He is ill-appearing.  HENT:     Head: Normocephalic.     Right Ear: External ear normal.     Left Ear: External ear normal.     Mouth/Throat:     Mouth: Mucous membranes are moist.  Eyes:     Extraocular Movements: Extraocular movements intact.  Cardiovascular:     Rate and Rhythm: Normal rate.     Pulses: Normal pulses.  Pulmonary:     Effort: Pulmonary effort is normal.  Abdominal:     Comments: Deferred  Genitourinary:    Comments: Deferred Musculoskeletal:        General: Normal range of motion.     Cervical back: Normal range of motion.  Skin:    Comments: Unable to assess  Neurological:     Mental Status: He is alert. He is disoriented.  Psychiatric:     Comments: Agitated and angry    Review of Systems  Constitutional:  Negative for chills and fever.  HENT:  Negative for sore throat.   Eyes:  Negative for blurred vision.  Respiratory:  Positive for cough.   Cardiovascular:  Negative for chest pain and palpitations.  Gastrointestinal:  Negative for abdominal pain, constipation, diarrhea, heartburn, nausea and vomiting.  Genitourinary:  Negative for dysuria, frequency and  urgency.  Musculoskeletal:  Negative for myalgias.  Skin: Negative.   Neurological:  Negative for dizziness and headaches.  Endo/Heme/Allergies:        See allergy listing  Psychiatric/Behavioral:  Negative for depression, hallucinations, substance abuse and suicidal ideas. The patient has insomnia. The patient is not nervous/anxious.    Blood pressure 110/73, pulse 90, temperature (!) 97.4 F (36.3 C), temperature source Oral, resp. rate 20, SpO2 98%. There is no height or weight on file to calculate BMI.  Musculoskeletal: Strength & Muscle Tone: within normal limits Gait & Station: normal Patient leans: N/A  [facility] MSE Discharge Disposition for Follow up and Recommendations: Based on my evaluation the patient appears to have an emergency medical condition for which I recommend the patient be admitted to observation unit pending availability of bed at [facility] for drug and alcohol detoxification.  Plans: Medications:      ascorbic acid (VITAMIN C) 1000 MG tablet Take 1 tablet (1,000 mg total) by mouth daily.     Patient not taking: Reported on 07/06/2023    doxycycline (VIBRA-TABS) 100 MG tablet Take 1 tablet (100 mg total) by mouth 2 (two) times daily.     Patient not taking: Reported on 07/06/2023  nicotine (NICODERM CQ - DOSED IN MG/24 HOURS) 14 mg/24hr patch Place 1 patch (14 mg total) onto the skin daily.    polyethylene glycol powder (GLYCOLAX/MIRALAX) 17 GM/SCOOP powder Take 17 g by mouth daily as needed for mild constipation.          Cecilie Lowers, FNP 07/06/2023, 5:25 PM

## 2023-07-06 NOTE — ED Provider Notes (Signed)
 Emergency Medicine Observation Re-evaluation Note  [name] is a 28 y.o. male, seen on rounds today.  Pt initially presented to the ED for complaints of Drug / Alcohol Assessment Currently, the patient is resting  Physical Exam  BP 104/72 (BP Location: Right Arm)   Pulse 82   Temp 98.1 F (36.7 C) (Oral)   Resp 20   Ht 5\' 9"  (1.753 m)   Wt 63.5 kg   SpO2 99%   BMI 20.67 kg/m  Physical Exam General: NAD Cardiac: RR Lungs: non-labored Psych: Calm   ED Course / MDM  EKG:   I have reviewed the labs performed to date as well as medications administered while in observation.  Recent changes in the last 24 hours include Psych eval.  Plan  Current plan is for discharge with [facility] resources.    Coral Spikes, DO 07/06/23 [phone]

## 2023-07-06 NOTE — ED Triage Notes (Addendum)
 Patient requesting to talk to social work and psych. Patient states he wants help on getting clean from drugs. Patient states he was recently admitted for finger injury and he felt like he was in a better place in the hospital but being on the streets make it difficult to stay clean. Last used meth and fentanyl tonight. Denies SI/HI.

## 2023-07-06 NOTE — ED Notes (Signed)
 Pt sleeping@this  time breathing even and unlabored will continue to monitor for safety

## 2023-07-06 NOTE — BH Assessment (Addendum)
 A referral to [facility] was made for this patient.  John with [facility] will contact us back regarding a time and provider to see this patient.

## 2023-07-06 NOTE — Progress Notes (Signed)
 Pt is admitted to Continuous observation due substance abuse and homelessness. Pt is alert and oriented X3. Pt is ambulatory and is oriented to staff/unit. Pt was cooperative with labs and skin assessment. Pt has his right thumb wrapped and he reported that he burned it by touching hot cast iron. Two small cuts were noted on pt's right foot. Pt denies pain and current SI/HI/AVH, plan or intent. Staff will monitor for pt's safety.

## 2023-07-06 NOTE — Consult Note (Signed)
 [facility] Psychiatric Consult Initial  Patient Name: .[name]  MRN: [id number]  DOB: 11-09-1995  Consult Order details:  Orders (From admission, onward)     Start     Ordered   07/06/23 0337  CONSULT TO CALL ACT TEAM       Ordering Provider: Maxwell Marion, PA-C  Provider:  (Not yet assigned)  Question:  Reason for Consult?  Answer:  drug problem   07/06/23 0336             Mode of Visit: In person    Psychiatry Consult Evaluation  Service Date: July 06, 2023 LOS:  LOS: 0 days  Chief Complaint polysubstance abuse  Primary Psychiatric Diagnoses  Polysubstance abuse 2.  Substance induced mood disorder   Assessment  [name] is a 28 y.o. male admitted: Presented to the EDfor 07/06/2023  2:18 AM for polysubstance. He carries the psychiatric diagnoses of anxiety and has a past medical history of osteomyelitis and substance abuse.   His current presentation of substance-induced mood disorder is most consistent with reported methamphetamine and fentanyl use. He meets criteria for chemical dependency program and/or facility based crisis based on reported substance abuse use.  Current outpatient psychotropic medications include facility based crisis, MAT program.  No documented psychotropic medication on initial examination, patient irritable slightly uncooperative. Please see plan below for detailed recommendations.   Diagnoses:  Active Hospital problems: Principal Problem:   Substance use    Plan   ## Psychiatric Medication Recommendations:  Follow-up with [facility] urgent care facility -Consideration for [facility] to establish MAT program -And/or chemical dependency outpatient program  ## Medical Decision Making Capacity: Not specifically addressed in this encounter  ## Further Work-up:  --N/A TOC consult for substance abuse resources -- most recent EKG on 3/27 had QtC of 385 -- Pertinent labwork reviewed earlier this admission includes: CMP,  CBC    ## Disposition:-- There are no psychiatric contraindications to discharge at this time  ## Behavioral / Environmental: - No specific recommendations at this time.     ## Safety and Observation Level:  - Based on my clinical evaluation, I estimate the patient to be at Moderate risk of self harm in the current setting. - At this time, we recommend  routine. This decision is based on my review of the chart including patient's history and current presentation, interview of the patient, mental status examination, and consideration of suicide risk including evaluating suicidal ideation, plan, intent, suicidal or self-harm behaviors, risk factors, and protective factors. This judgment is based on our ability to directly address suicide risk, implement suicide prevention strategies, and develop a safety plan while the patient is in the clinical setting. Please contact our team if there is a concern that risk level has changed.  CSSR Risk Category:C-SSRS RISK CATEGORY: No Risk  Suicide Risk Assessment: Patient has following modifiable risk factors for suicide: medication noncompliance and lack of access to outpatient mental health resources, which we are addressing by following up with [facility] urgent Care. Patient has following non-modifiable or demographic risk factors for suicide: male gender Patient has the following protective factors against suicide: Access to outpatient mental health care  Thank you for this consult request. Recommendations have been communicated to the primary team.  We will recommended outpatient with [facility] at this time.   Oneta Rack, NP       History of Present Illness  Relevant Aspects of Hospital ED Course:  Admitted on 07/06/2023  for polysubstance abuse  Patient Report:  See HPI  Psych ROS:  Depression: Positive Anxiety: Positive Mania (lifetime and current): Denied Psychosis: (lifetime and current): Denied  Collateral information:   Provided by patient  Review of Systems  Psychiatric/Behavioral:  Positive for substance abuse.      Psychiatric and Social History  Psychiatric History:  Information collected [employer].  28 year old Caucasian male presents to the emergency department due to polysubstance abuse.  Per initial admission assessment note; "[name] is a 28 y.o. male with a history of asthma, cardiomyopathy, and homelessness who presents the ED today for mental health concerns.  Patient is requesting to talk with social work and our psychiatric team.  He states that whenever he is on the streets he is self-destructive and does a lot of drugs and gets in fights with people.  Last used meth and fentanyl tonight.  He was admitted in the hospital the other week for osteomyelitis and states that he was "better mind space" then. He denies any SI, HI, or hallucinations.  No additional complaints or concerns at this time"  Patient was seen and evaluated face-to-face by this provider.  He reports he is needing help with fentanyl and methamphetamine use.  States he last used yesterday prior to his arrival.  Denied that he is ever been treated in a residential treatment program.  Patient then became irritable stating " I just need help" multiple attempts to assess patient for detailed follow-up services.  Patient then pulled the covers over his head.  Provider will make additional outpatient resources available for chemical dependency outpatient programming and/or MAT program through [facility].  May follow-up with [facility] urgent care facility.  Support encouragement reassurance was provided.  Case staffed with attending psychiatrist MD Nancy Nordmann  Prev Dx/Sx: Polysubstance abuse Current Psych Provider: Denied Home Meds (current): Chart reviewed no home meds listed Previous Med Trials: Denied Therapy: denied  Prior Psych Hospitalization: Unknown Prior Self Harm: Denied Prior Violence: Documented verbal/physical  altercations on the streets  Family Psych History: Unknown Family Hx suicide: Unknown  Social History:  Unknown Access to weapons/lethal means: Denied  Substance History Seeking treatment for fentanyl and methamphetamine use.  Exam Findings  Physical Exam: See chart Vital Signs:  Temp:  [97.6 F (36.4 C)-98.1 F (36.7 C)] 98.1 F (36.7 C) (03/27 0255) Pulse Rate:  [80-82] 82 (03/27 0255) Resp:  [17-20] 20 (03/27 0255) BP: (104-119)/(72-78) 104/72 (03/27 0255) SpO2:  [99 %] 99 % (03/27 0255) Weight:  [63.5 kg] 63.5 kg (03/27 0229) Blood pressure 104/72, pulse 82, temperature 98.1 F (36.7 C), temperature source Oral, resp. rate 20, height 5\' 9"  (1.753 m), weight 63.5 kg, SpO2 99%. Body mass index is 20.67 kg/m.  Physical Exam Vitals and nursing note reviewed.  Cardiovascular:     Rate and Rhythm: Normal rate and regular rhythm.  Neurological:     Mental Status: He is alert.  Psychiatric:        Mood and Affect: Mood normal.        Behavior: Behavior normal.     Mental Status Exam: General Appearance: Casual  Orientation:  Full (Time, Place, and Person)  Memory:  Immediate;   Good  Concentration:  Concentration: Good  Recall:  Good  Attention  Fair  Eye Contact:  Fair  Speech:  Clear and Coherent  Language:  Good  Volume:  Normal  Mood: irritable   Affect:  Congruent  Thought Process:  Coherent  Thought Content:  Logical  Suicidal Thoughts:  No  Homicidal Thoughts:  No  Judgement:  Fair  Insight:  Fair  Psychomotor Activity:  Normal  Akathisia:  No  Fund of Knowledge:  Fair   Assets:  Desire for Improvement Social Support  Cognition:  WNL  ADL's:  Intact  AIMS (if indicated):        Other History   These have been pulled in through the EMR, reviewed, and updated if appropriate.  Family History:  The patient's family history is not on file.  Medical History: Past Medical History:  Diagnosis Date   Acid reflux    Asthma     Surgical  History: Past Surgical History:  Procedure Laterality Date   ESOPHAGUS SURGERY     INCISION AND DRAINAGE ABSCESS Right 06/30/2023   Procedure: Incision and drainage right thumb felon with debridement of osteomyelitis;  Surgeon: Betha Loa, MD;  Location: [facility] OR;  Service: Orthopedics;  Laterality: Right;  I & D INFECTED RIGHT THUMB     Medications:  No current facility-administered medications for this encounter.  Current Outpatient Medications:    ascorbic acid (VITAMIN C) 1000 MG tablet, Take 1 tablet (1,000 mg total) by mouth daily. (Patient not taking: Reported on 07/06/2023), Disp: 30 tablet, Rfl: 1   doxycycline (VIBRA-TABS) 100 MG tablet, Take 1 tablet (100 mg total) by mouth 2 (two) times daily. (Patient not taking: Reported on 07/06/2023), Disp: 84 tablet, Rfl: 0   nicotine (NICODERM CQ - DOSED IN MG/24 HOURS) 14 mg/24hr patch, Place 1 patch (14 mg total) onto the skin daily. (Patient not taking: Reported on 07/06/2023), Disp: 28 patch, Rfl: 0   oxyCODONE (OXY IR/ROXICODONE) 5 MG immediate release tablet, Take 1 tablet (5 mg total) by mouth every 6 (six) hours as needed for moderate pain (pain score 4-6) or breakthrough pain. (Patient not taking: Reported on 07/06/2023), Disp: 30 tablet, Rfl: 0   polyethylene glycol powder (GLYCOLAX/MIRALAX) 17 GM/SCOOP powder, Take 17 g by mouth daily as needed for mild constipation. (Patient not taking: Reported on 07/06/2023), Disp: 238 g, Rfl: 0  Allergies: No Known Allergies  Oneta Rack, NP

## 2023-07-06 NOTE — ED Notes (Signed)
 Pt changed into purple scrubs, clothing (sweat pants, jeans, multiple shirt, sock and boots) placed in two belongings bag, aong with large open tote placed in purple zone lockers.

## 2023-07-07 ENCOUNTER — Other Ambulatory Visit: Payer: Self-pay

## 2023-07-07 ENCOUNTER — Encounter (HOSPITAL_COMMUNITY): Payer: Self-pay

## 2023-07-07 ENCOUNTER — Emergency Department (HOSPITAL_COMMUNITY)
Admission: EM | Admit: 2023-07-07 | Discharge: 2023-07-07 | Disposition: A | Discharge status: Home / Self Care | Payer: MEDICAID | Attending: Emergency Medicine | Admitting: Emergency Medicine

## 2023-07-07 ENCOUNTER — Encounter (HOSPITAL_COMMUNITY): Payer: Self-pay | Admitting: *Deleted

## 2023-07-07 DIAGNOSIS — M79644 Pain in right finger(s): Secondary | ICD-10-CM | POA: Insufficient documentation

## 2023-07-07 DIAGNOSIS — Z4800 Encounter for change or removal of nonsurgical wound dressing: Secondary | ICD-10-CM | POA: Diagnosis present

## 2023-07-07 DIAGNOSIS — Z87891 Personal history of nicotine dependence: Secondary | ICD-10-CM | POA: Diagnosis not present

## 2023-07-07 DIAGNOSIS — Z4889 Encounter for other specified surgical aftercare: Secondary | ICD-10-CM

## 2023-07-07 DIAGNOSIS — F1990 Other psychoactive substance use, unspecified, uncomplicated: Secondary | ICD-10-CM | POA: Diagnosis not present

## 2023-07-07 MED ORDER — TRAZODONE HCL 50 MG PO TABS: 50.0000 mg | ORAL_TABLET | Freq: Every evening | ORAL | 0 refills | Status: DC | PRN

## 2023-07-07 MED ORDER — NICOTINE 21 MG/24HR TD PT24
21.0000 mg | MEDICATED_PATCH | Freq: Every day | TRANSDERMAL | 0 refills | Status: DC
Start: 2023-07-08 — End: 2023-07-10

## 2023-07-07 MED ORDER — POLYETHYLENE GLYCOL 3350 17 G PO PACK: 17.0000 g | PACK | Freq: Every day | ORAL | 0 refills | Status: DC

## 2023-07-07 NOTE — ED Provider Notes (Signed)
 [place] EMERGENCY DEPARTMENT AT [facility] Provider Note   CSN: [id number] Arrival date & time: 07/07/23  2103     History  Chief Complaint  Patient presents with   thumb pain    [name] is a 28 y.o. male.  Patient with past medical history significant for cardiomyopathy of unknown etiology, homelessness, polysubstance abuse and former smoker. Patient presents to the emergency room complaining of discomfort in the right thumb.  He had incision and drainage of the right thumb due to abscess and osteomyelitis with surgery on March 21.  He was discharged on March 25.  He is requesting saline to loosen the bandage  so he can remove it.  Patient also requesting a place to rest.  He denies SI and HI.  He tells me that his friends have told him to claim SI so that he can have a place to rest but he states he has no suicidal or homicidal ideations.  No other complaints at this time. Of note patient was in this emergency department yesterday  HPI     Home Medications Prior to Admission medications   Medication Sig Start Date End Date Taking? Authorizing Provider  ascorbic acid (VITAMIN C) 1000 MG tablet Take 1 tablet (1,000 mg total) by mouth daily. Patient not taking: Reported on 07/06/2023 07/04/23   Berton Mount I, MD  doxycycline (VIBRA-TABS) 100 MG tablet Take 1 tablet (100 mg total) by mouth 2 (two) times daily. 07/03/23 08/14/23  Berton Mount I, MD  nicotine (NICODERM CQ - DOSED IN MG/24 HOURS) 21 mg/24hr patch Place 1 patch (21 mg total) onto the skin daily. 07/08/23   Ntuen, Jesusita Oka, FNP  polyethylene glycol (MIRALAX / GLYCOLAX) 17 g packet Take 17 g by mouth daily. 07/08/23   Cecilie Lowers, FNP  traZODone (DESYREL) 50 MG tablet Take 1 tablet (50 mg total) by mouth at bedtime as needed for sleep. 07/07/23   Cecilie Lowers, FNP      Allergies    Patient has no known allergies.    Review of Systems   Review of Systems  Physical Exam Updated Vital Signs BP  108/67   Pulse 76   Temp 98.6 F (37 C)   Resp 16   Ht 5\' 9"  (1.753 m)   Wt 63.5 kg   SpO2 97%   BMI 20.67 kg/m  Physical Exam Vitals and nursing note reviewed.  HENT:     Head: Normocephalic and atraumatic.  Eyes:     Pupils: Pupils are equal, round, and reactive to light.  Cardiovascular:     Rate and Rhythm: Normal rate.  Pulmonary:     Effort: Pulmonary effort is normal. No respiratory distress.  Musculoskeletal:     Cervical back: Normal range of motion.     Comments: Right thumb mildly swollen, sensation intact, incisions appear to be well-healing from recent I&D.  Skin:    General: Skin is dry.  Neurological:     Mental Status: He is alert.  Psychiatric:        Speech: Speech normal.        Behavior: Behavior normal.     Comments: Patient agitated when told that we would not be able to give him a bed to rest in this evening     ED Results / Procedures / Treatments   Labs (all labs ordered are listed, but only abnormal results are displayed) Labs Reviewed - No data to display  EKG None  Radiology No results found.  Procedures Procedures    Medications Ordered in ED Medications - No data to display  ED Course/ Medical Decision Making/ A&P                                 Medical Decision Making  Patient's right thumb assessed at patient's request.  It appears to be healing appropriately.  Patient endorses taking his prescribed doxycycline.  I offered to have the wound rewrapped and the patient initially declined.  He stated he would drive himself.  Patient's chief complaint seems to be more related to the fact that he is unhoused at this time.  I have offered the patient resources and he has declined.  The patient also mentioned rehab and I referred him once again to our behavioral urgent care but the patient states he was displeased with his most recent visit there.  At this time I do not see what else we can offer from the emergency department  perspective.  Patient is agitated about being unhoused.  At this time there is no indication for further emergent workup or admission.  Patient to be discharged.        Final Clinical Impression(s) / ED Diagnoses Final diagnoses:  Encounter for post surgical wound check    Rx / DC Orders ED Discharge Orders     None         Pamala Duffel 07/07/23 2304    Nira Conn, MD 07/08/23 [phone]

## 2023-07-07 NOTE — ED Provider Notes (Addendum)
 FBC/OBS ASAP Discharge Summary  Date and Time: 07/07/2023 12:21 PM  Name: [name]  MRN:  [id number]   Discharge Diagnoses:  Final diagnoses:  Polysubstance use disorder   Subjective:  [name] is a 28 y.o. homeless male, who presents with his luggages unaccompanied to [facility] behavioral health urgent care for complaint of homelessness and needing a place to stay for 2 days in the context of polysubstance abuse of heroin, methamphetamine, alcohol, and occasional cocaine use.  He has prior psychiatric diagnoses of anxiety and has a past medical history of osteomyelitis and polysubstance abuse.    Patient is seen and assessed face-to-face on his bed.  Chart reviewed with the treatment and consult with attending psychiatrist Dr. Woodroe Mode.  Stay Summary: HOSPITAL COURSE:  During the patient's hospitalization, patient had extensive initial psychiatric evaluation, and follow-up psychiatric evaluations every day.  Psychiatric diagnoses provided upon initial assessment: Homelessness with anxiety and depression in the context of polysubstance use disorder  Patient's psychiatric medications were adjusted on admission:  ascorbic acid (VITAMIN C) 1000 MG tablet Take 1 tablet (1,000 mg total) by mouth daily.        Patient not taking: Reported on 07/06/2023      doxycycline (VIBRA-TABS) 100 MG tablet Take 1 tablet (100 mg total) by mouth 2 (two) times daily.        Patient not taking: Reported on 07/06/2023      nicotine (NICODERM CQ - DOSED IN MG/24 HOURS) 14 mg/24hr patch Place 1 patch (14 mg total) onto the skin daily.      polyethylene glycol powder (GLYCOLAX/MIRALAX) 17 GM/SCOOP powder Take 17 g by mouth daily as needed for mild constipation.               During the hospitalization, other adjustments were made to the patient's psychiatric medication regimen: None  Patient's care was discussed during the interdisciplinary team meeting every day during the  hospitalization.  The patient denies having side effects to prescribed psychiatric medication.  Gradually, patient started adjusting to milieu. The patient was evaluated each day by a clinical provider to ascertain response to treatment. Improvement was noted by the patient's report of decreasing symptoms, improved sleep and appetite, affect, medication tolerance, behavior, and participation in unit programming.  Patient was asked each day to complete a self inventory noting mood, mental status, pain, new symptoms, anxiety and concerns.    Symptoms were reported as significantly decreased or resolved completely by discharge.   On day of discharge, the patient reports that their mood is stable. The patient denied having suicidal thoughts for more than 48 hours prior to discharge.  Patient denies having homicidal thoughts.  Patient denies having auditory hallucinations.  Patient denies any visual hallucinations or other symptoms of psychosis. The patient was motivated to continue taking medication with a goal of continued improvement in mental health.   The patient reports their target psychiatric symptoms of anxiety/agitation responded well to the psychiatric medications, and the patient reports overall benefit other psychiatric hospitalization. Supportive psychotherapy was provided to the patient. The patient also participated in regular group therapy while hospitalized. Coping skills, problem solving as well as relaxation therapies were also part of the unit programming.  Labs were reviewed with the patient, and abnormal results were discussed with the patient.  The patient is able to verbalize their individual safety plan to this provider.  # It is recommended to the patient to continue psychiatric medications as prescribed, after discharge from the  hospital.    # It is recommended to the patient to follow up with your outpatient psychiatric provider and PCP.  # It was discussed with the  patient, the impact of alcohol, drugs, tobacco have been there overall psychiatric and medical wellbeing, and total abstinence from substance use was recommended the patient.ed.  # Prescriptions provided or sent directly to preferred pharmacy at discharge. Patient agreeable to plan. Given opportunity to ask questions. Appears to feel comfortable with discharge.    # In the event of worsening symptoms, the patient is instructed to call the crisis hotline, 911 and or go to the nearest ED for appropriate evaluation and treatment of symptoms. To follow-up with primary care provider for other medical issues, concerns and or health care needs  # Patient was discharged to the shelter with a plan to follow up as noted below.  See AVS for resources  Total Time spent with patient: 30 minutes  Past Psychiatric History: See H&P Past Medical History: See H&P Family History: See H&P Family Psychiatric History: See H&P Social History: See H&P Tobacco Cessation:  A prescription for an FDA-approved tobacco cessation medication was offered at discharge and the patient refused  Current Medications:  Current Facility-Administered Medications  Medication Dose Route Frequency Provider Last Rate Last Admin   acetaminophen (TYLENOL) tablet 650 mg  650 mg Oral Q6H PRN Leticia Mcdiarmid, Jesusita Oka, FNP       alum & mag hydroxide-simeth (MAALOX/MYLANTA) 200-200-20 MG/5ML suspension 30 mL  30 mL Oral Q4H PRN Susette Seminara, Jesusita Oka, FNP       ascorbic acid (VITAMIN C) tablet 1,000 mg  1,000 mg Oral Daily Lestine Rahe C, FNP       haloperidol (HALDOL) tablet 5 mg  5 mg Oral TID PRN Laura-Lee Villegas, Jesusita Oka, FNP       And   diphenhydrAMINE (BENADRYL) capsule 50 mg  50 mg Oral TID PRN Jagger Demonte, Jesusita Oka, FNP       haloperidol lactate (HALDOL) injection 5 mg  5 mg Intramuscular TID PRN Mar Zettler, Jesusita Oka, FNP       And   diphenhydrAMINE (BENADRYL) injection 50 mg  50 mg Intramuscular TID PRN Tyia Binford, Jesusita Oka, FNP       And   LORazepam (ATIVAN) injection 2 mg  2 mg  Intramuscular TID PRN Tykira Wachs, Jesusita Oka, FNP       haloperidol lactate (HALDOL) injection 10 mg  10 mg Intramuscular TID PRN Narda Fundora, Jesusita Oka, FNP       And   diphenhydrAMINE (BENADRYL) injection 50 mg  50 mg Intramuscular TID PRN Gionna Polak, Jesusita Oka, FNP       And   LORazepam (ATIVAN) injection 2 mg  2 mg Intramuscular TID PRN Sophira Rumler, Jesusita Oka, FNP       magnesium hydroxide (MILK OF MAGNESIA) suspension 30 mL  30 mL Oral Daily PRN Matai Carpenito, Jesusita Oka, FNP       nicotine (NICODERM CQ - dosed in mg/24 hours) patch 21 mg  21 mg Transdermal Daily Bernita Beckstrom C, FNP   21 mg at 07/07/23 0931   polyethylene glycol (MIRALAX / GLYCOLAX) packet 17 g  17 g Oral Daily Emelynn Rance, Jesusita Oka, FNP       traZODone (DESYREL) tablet 50 mg  50 mg Oral QHS PRN Tishie Altmann, Jesusita Oka, FNP       Current Outpatient Medications  Medication Sig Dispense Refill   doxycycline (VIBRA-TABS) 100 MG tablet Take 1 tablet (100 mg total) by mouth 2 (two)  times daily. 84 tablet 0   ascorbic acid (VITAMIN C) 1000 MG tablet Take 1 tablet (1,000 mg total) by mouth daily. (Patient not taking: Reported on 07/06/2023) 30 tablet 1   nicotine (NICODERM CQ - DOSED IN MG/24 HOURS) 14 mg/24hr patch Place 1 patch (14 mg total) onto the skin daily. (Patient not taking: Reported on 07/06/2023) 28 patch 0   oxyCODONE (OXY IR/ROXICODONE) 5 MG immediate release tablet Take 1 tablet (5 mg total) by mouth every 6 (six) hours as needed for moderate pain (pain score 4-6) or breakthrough pain. (Patient not taking: Reported on 07/06/2023) 30 tablet 0   polyethylene glycol powder (GLYCOLAX/MIRALAX) 17 GM/SCOOP powder Take 17 g by mouth daily as needed for mild constipation. (Patient not taking: Reported on 07/06/2023) 238 g 0   PTA Medications:  Facility Ordered Medications  Medication   acetaminophen (TYLENOL) tablet 650 mg   alum & mag hydroxide-simeth (MAALOX/MYLANTA) 200-200-20 MG/5ML suspension 30 mL   magnesium hydroxide (MILK OF MAGNESIA) suspension 30 mL   haloperidol (HALDOL)  tablet 5 mg   And   diphenhydrAMINE (BENADRYL) capsule 50 mg   haloperidol lactate (HALDOL) injection 5 mg   And   diphenhydrAMINE (BENADRYL) injection 50 mg   And   LORazepam (ATIVAN) injection 2 mg   haloperidol lactate (HALDOL) injection 10 mg   And   diphenhydrAMINE (BENADRYL) injection 50 mg   And   LORazepam (ATIVAN) injection 2 mg   traZODone (DESYREL) tablet 50 mg   ascorbic acid (VITAMIN C) tablet 1,000 mg   [COMPLETED] doxycycline (VIBRA-TABS) tablet 100 mg   nicotine (NICODERM CQ - dosed in mg/24 hours) patch 21 mg   polyethylene glycol (MIRALAX / GLYCOLAX) packet 17 g   PTA Medications  Medication Sig   doxycycline (VIBRA-TABS) 100 MG tablet Take 1 tablet (100 mg total) by mouth 2 (two) times daily.   oxyCODONE (OXY IR/ROXICODONE) 5 MG immediate release tablet Take 1 tablet (5 mg total) by mouth every 6 (six) hours as needed for moderate pain (pain score 4-6) or breakthrough pain. (Patient not taking: Reported on 07/06/2023)   nicotine (NICODERM CQ - DOSED IN MG/24 HOURS) 14 mg/24hr patch Place 1 patch (14 mg total) onto the skin daily. (Patient not taking: Reported on 07/06/2023)   polyethylene glycol powder (GLYCOLAX/MIRALAX) 17 GM/SCOOP powder Take 17 g by mouth daily as needed for mild constipation. (Patient not taking: Reported on 07/06/2023)   ascorbic acid (VITAMIN C) 1000 MG tablet Take 1 tablet (1,000 mg total) by mouth daily. (Patient not taking: Reported on 07/06/2023)        No data to display          Flowsheet Row ED from 07/06/2023 in [facility] Most recent reading at 07/06/2023  6:55 PM ED from 07/06/2023 in [facility] Emergency Department at [facility] Most recent reading at 07/06/2023  2:29 AM ED to Hosp-Admission (Discharged) from 06/29/2023 in [facility] Most recent reading at 06/29/2023 12:00 PM  C-SSRS RISK CATEGORY No Risk No Risk No Risk      Musculoskeletal  Strength & Muscle Tone:  within normal limits Gait & Station: normal Patient leans: N/A  Psychiatric Specialty Exam  Presentation  General Appearance:  Bizarre; Disheveled  Eye Contact: Fleeting  Speech: Garbled; Slurred  Speech Volume: Increased  Handedness: Right  Mood and Affect  Mood: Angry  Affect: Constricted; Blunt  Thought Process  Thought Processes: Linear; Disorganized  Descriptions of Associations:Tangential  Orientation:Full (Time, Place and Person)  Thought Content:Illusions; Rumination; Scattered     Hallucinations:Hallucinations: None  Ideas of Reference:None  Suicidal Thoughts:Suicidal Thoughts: No  Homicidal Thoughts:Homicidal Thoughts: No  Sensorium  Memory: Immediate Poor; Remote Poor; Recent Poor  Judgment: Poor  Insight: Poor  Executive Functions  Concentration: Fair  Attention Span: Fair  Recall: Poor  Fund of Knowledge: Poor  Language: Poor  Psychomotor Activity  Psychomotor Activity: Psychomotor Activity: Increased  Assets  Assets: Physical Health; Resilience (Homelessness)  Sleep  Sleep: Sleep: Good Number of Hours of Sleep: 8  Nutritional Assessment (For OBS and FBC admissions only) Has the patient had a weight loss or gain of 10 pounds or more in the last 3 months?: No Has the patient had a decrease in food intake/or appetite?: No Does the patient have dental problems?: No Does the patient have eating habits or behaviors that may be indicators of an eating disorder including binging or inducing vomiting?: No Has the patient recently lost weight without trying?: 0 Has the patient been eating poorly because of a decreased appetite?: 0 Malnutrition Screening Tool Score: 0   Physical Exam  Physical Exam Vitals and nursing note reviewed.  HENT:     Head: Normocephalic.     Nose: Nose normal.     Mouth/Throat:     Mouth: Mucous membranes are moist.     Pharynx: Oropharynx is clear.  Eyes:     Extraocular Movements:  Extraocular movements intact.  Cardiovascular:     Pulses: Normal pulses.  Pulmonary:     Effort: Pulmonary effort is normal.  Abdominal:     Comments: Deferred  Genitourinary:    Comments: Deferred  Musculoskeletal:        General: Normal range of motion.  Skin:    General: Skin is warm.  Neurological:     General: No focal deficit present.     Mental Status: He is alert and oriented to person, place, and time.  Psychiatric:     Comments: Irritable and agitated.    Review of Systems  Constitutional:  Negative for chills and fever.  HENT:  Negative for sore throat.   Eyes:  Negative for blurred vision.  Respiratory:  Negative for cough, sputum production, shortness of breath and wheezing.   Cardiovascular:  Negative for chest pain and palpitations.  Gastrointestinal:  Negative for abdominal pain, constipation, diarrhea, heartburn, nausea and vomiting.  Genitourinary:  Negative for dysuria.  Musculoskeletal:  Negative for myalgias.  Skin:  Negative for itching and rash.  Neurological:  Negative for dizziness, tingling, tremors and headaches.  Endo/Heme/Allergies:        See allergy listing  Psychiatric/Behavioral:  Negative for depression, hallucinations, substance abuse and suicidal ideas. The patient is not nervous/anxious and does not have insomnia.    Blood pressure 99/67, pulse 70, temperature (!) 97.3 F (36.3 C), temperature source Oral, resp. rate 16, SpO2 98%. There is no height or weight on file to calculate BMI.  Demographic Factors:  Male, Caucasian, Low socioeconomic status, and Unemployed  Loss Factors: Financial problems/change in socioeconomic status  Historical Factors: UTD  Risk Reduction Factors:   NA  Continued Clinical Symptoms:  Severe Anxiety and/or Agitation Depression:   Aggression Hopelessness Impulsivity Severe Alcohol/Substance Abuse/Dependencies More than one psychiatric diagnosis Previous Psychiatric Diagnoses and  Treatments Medical Diagnoses and Treatments/Surgeries  Cognitive Features That Contribute To Risk:  Polarized thinking    Suicide Risk:  Moderate:  Frequent suicidal ideation with limited intensity, and duration, some specificity  in terms of plans, no associated intent, good self-control, limited dysphoria/symptomatology, some risk factors present, and identifiable protective factors, including available and accessible social support.  Plan Of Care/Follow-up recommendation Discharge Recommendations:  The patient is being discharged as per his request. Patient is to take his discharge medications as ordered.  See follow up above. We recommend that he participates in individual therapy to target uncontrollable agitation and substance abuse.  We recommend that he participates in family therapy to target the conflict with his family, to improve communication skills and conflict resolution skills.  Family is to initiate/implement a contingency based behavioral model to address patient's behavior. We recommend that he gets AIMS scale, height, weight, blood pressure, fasting lipid panel, fasting blood sugar in three months from discharge as he's on atypical antipsychotics.  Patient will benefit from monitoring of recurrent suicidal ideation since patient is on antidepressant medication. The patient should abstain from all illicit substances and alcohol. If the patient's symptoms worsen or do not continue to improve or if the patient becomes actively suicidal or homicidal then it is recommended that the patient return to the closest hospital emergency room or call 911 for further evaluation and treatment. National Suicide Prevention Lifeline 1800-SUICIDE or 605-187-0277. Please follow up with your primary medical doctor for all other medical needs.  The patient has been educated on the possible side effects to medications and he/his guardian is to contact a medical professional and inform outpatient  provider of any new side effects of medication. He is to take regular diet and activity as tolerated.  Will benefit from moderate daily exercise. Family was educated about removing/locking any firearms, medications or dangerous products from the home.  Disposition: Discharged.  See resources below  .Marland KitchenAdult Mental Health Resources  [facility] Address: [address] [place], [place] [address] Phone: [phone]  [facility], [facility] Address: [address] [place], [place] [address] Hours:  Closed ? Opens 8?AM Mon Phone: [phone]  [facility] Address: [address], [place], [place] [address] Phone: [phone]  [facility], [facility] Address: [address] [place], [place] [address] Phone: [phone]  [facility] Address: [place] [facility], [address], [place], [place] [address] Hours:  Closed ? Opens 8?AM Mon Phone: [phone]  [facility] Address: [address] [place], [place], [place] [address] Phone: [phone] INTAKE: [phone] Ext [phone] Counseling Group Address: [address], [place], [place] [address] Phone: [phone]  [facility] Address: [address], [place], [place] [address] Phone: [phone]  [facility] Address: [address], [place], [place] [address] Phone: [phone]  [facility] Address: [address] [place] [place], [place] [address] Phone: [phone]  [facility] Phone: [phone]   [facility]  Address: [address]ella St[address] [place], [place] [address] Hours: Open 24 hours Phone: [phone]  Cecilie Lowers, FNP 07/07/2023, 12:21 PM

## 2023-07-07 NOTE — Discharge Instructions (Signed)
 Follow up with orthopedic surgery and primary care

## 2023-07-07 NOTE — ED Notes (Signed)
Patient observed resting quietly, eyes closed. Respirations equal and unlabored. Will continue to monitor for safety.  

## 2023-07-07 NOTE — ED Notes (Addendum)
 Pt yelling and pacing.  After provider was bedside, he informed RN that the thumb needed to be rewrapped.  Pt allowed RN to start wrapping it however halfway through he started yelling again.  RN managed to talk him into allowing RN to wrap it.  After this was down Security escorted pt out when he refused discharge vitals.  Provider had discharged pt.
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
## 2023-07-07 NOTE — ED Notes (Signed)
 Pt sleeping@this  time breathing even and unlabored will continue to monitor for safety

## 2023-07-07 NOTE — ED Notes (Signed)
Pt requesting to be discharged. Provider notified.

## 2023-07-07 NOTE — ED Provider Notes (Signed)
 Behavioral Health Progress Note  Date and Time: 07/07/2023 11:05 AM Name: Kristofor Michalowski MRN:  [id number]  Subjective:  [name] is a 28 y.o. homeless male, who presents with his luggages unaccompanied to [facility] behavioral health urgent care for complaint of homelessness and needing a place to stay for 2 days in the context of polysubstance abuse of heroin, methamphetamine, alcohol, and occasional cocaine use.  He has prior psychiatric diagnoses of anxiety and has a past medical history of osteomyelitis and polysubstance abuse.   Patient is seen and assessed face-to-face on his bed.  Chart reviewed with the treatment and consult with attending psychiatrist Dr. Woodroe Mode.  Objective: Patient presents alert, agitated, angry, however, oriented to name, place, and situation.  Appearance remains unkempt, disheveled and malodorous.  Speech is heightened however, clear but tangential.  Maintained fleeting eye contact with this provider.  Thought process illogical and thought content with high anxiety and delusional.  Patient blaming other people for his problem of living on the streets.  Denies anxiety and depression and rates both as number is 0/10, with 10 being high severity.  He reports appetite is good, and sleeping 6 hours last night.  Vital signs without critical values.  Continues to deny SI, HI or AVH.  No changes in his treatment plan at this time.  Awaiting bed availability at [facility] for detoxification for polysubstance use disorder.  Diagnosis:  Final diagnoses:  Polysubstance use disorder   Total Time spent with patient: 45 minutes  Past Psychiatric History: See H&P Past Medical History: See H&P Family History: See H&P Family Psychiatric  History: See H&P Social History: See H&P  Additional Social History:    Sleep: Good  Appetite:  Good  Current Medications:  Current Facility-Administered Medications  Medication Dose Route Frequency Provider Last Rate Last Admin    acetaminophen (TYLENOL) tablet 650 mg  650 mg Oral Q6H PRN Jahn Franchini, Jesusita Oka, FNP       alum & mag hydroxide-simeth (MAALOX/MYLANTA) 200-200-20 MG/5ML suspension 30 mL  30 mL Oral Q4H PRN Karianne Nogueira, Jesusita Oka, FNP       ascorbic acid (VITAMIN C) tablet 1,000 mg  1,000 mg Oral Daily Rilley Poulter C, FNP       haloperidol (HALDOL) tablet 5 mg  5 mg Oral TID PRN Ibeth Fahmy, Jesusita Oka, FNP       And   diphenhydrAMINE (BENADRYL) capsule 50 mg  50 mg Oral TID PRN Katja Blue, Jesusita Oka, FNP       haloperidol lactate (HALDOL) injection 5 mg  5 mg Intramuscular TID PRN Bernese Doffing, Jesusita Oka, FNP       And   diphenhydrAMINE (BENADRYL) injection 50 mg  50 mg Intramuscular TID PRN Ezra Denne, Jesusita Oka, FNP       And   LORazepam (ATIVAN) injection 2 mg  2 mg Intramuscular TID PRN Alianah Lofton, Jesusita Oka, FNP       haloperidol lactate (HALDOL) injection 10 mg  10 mg Intramuscular TID PRN Kyrie Fludd, Jesusita Oka, FNP       And   diphenhydrAMINE (BENADRYL) injection 50 mg  50 mg Intramuscular TID PRN Yanni Quiroa, Jesusita Oka, FNP       And   LORazepam (ATIVAN) injection 2 mg  2 mg Intramuscular TID PRN Keyosha Tiedt, Jesusita Oka, FNP       magnesium hydroxide (MILK OF MAGNESIA) suspension 30 mL  30 mL Oral Daily PRN Fraser Busche, Jesusita Oka, FNP       nicotine (NICODERM CQ - dosed  in mg/24 hours) patch 21 mg  21 mg Transdermal Daily Evelia Waskey, Jesusita Oka, FNP   21 mg at 07/07/23 0931   polyethylene glycol (MIRALAX / GLYCOLAX) packet 17 g  17 g Oral Daily Veryl Abril, Jesusita Oka, FNP       traZODone (DESYREL) tablet 50 mg  50 mg Oral QHS PRN Alekai Pocock, Jesusita Oka, FNP       Current Outpatient Medications  Medication Sig Dispense Refill   doxycycline (VIBRA-TABS) 100 MG tablet Take 1 tablet (100 mg total) by mouth 2 (two) times daily. 84 tablet 0   ascorbic acid (VITAMIN C) 1000 MG tablet Take 1 tablet (1,000 mg total) by mouth daily. (Patient not taking: Reported on 07/06/2023) 30 tablet 1   nicotine (NICODERM CQ - DOSED IN MG/24 HOURS) 14 mg/24hr patch Place 1 patch (14 mg total) onto the skin daily. (Patient not taking: Reported  on 07/06/2023) 28 patch 0   oxyCODONE (OXY IR/ROXICODONE) 5 MG immediate release tablet Take 1 tablet (5 mg total) by mouth every 6 (six) hours as needed for moderate pain (pain score 4-6) or breakthrough pain. (Patient not taking: Reported on 07/06/2023) 30 tablet 0   polyethylene glycol powder (GLYCOLAX/MIRALAX) 17 GM/SCOOP powder Take 17 g by mouth daily as needed for mild constipation. (Patient not taking: Reported on 07/06/2023) 238 g 0    Labs  Lab Results:  Admission on 07/06/2023  Component Date Value Ref Range Status   WBC 07/06/2023 6.9  4.0 - 10.5 K/uL Final   RBC 07/06/2023 4.61  4.22 - 5.81 MIL/uL Final   Hemoglobin 07/06/2023 14.1  13.0 - 17.0 g/dL Final   HCT 16/01/9603 40.6  39.0 - 52.0 % Final   MCV 07/06/2023 88.1  80.0 - 100.0 fL Final   MCH 07/06/2023 30.6  26.0 - 34.0 pg Final   MCHC 07/06/2023 34.7  30.0 - 36.0 g/dL Final   RDW 54/12/8117 12.0  11.5 - 15.5 % Final   Platelets 07/06/2023 289  150 - 400 K/uL Final   nRBC 07/06/2023 0.0  0.0 - 0.2 % Final   Neutrophils Relative % 07/06/2023 51  % Final   Neutro Abs 07/06/2023 3.5  1.7 - 7.7 K/uL Final   Lymphocytes Relative 07/06/2023 31  % Final   Lymphs Abs 07/06/2023 2.1  0.7 - 4.0 K/uL Final   Monocytes Relative 07/06/2023 15  % Final   Monocytes Absolute 07/06/2023 1.0  0.1 - 1.0 K/uL Final   Eosinophils Relative 07/06/2023 2  % Final   Eosinophils Absolute 07/06/2023 0.1  0.0 - 0.5 K/uL Final   Basophils Relative 07/06/2023 1  % Final   Basophils Absolute 07/06/2023 0.1  0.0 - 0.1 K/uL Final   Immature Granulocytes 07/06/2023 0  % Final   Abs Immature Granulocytes 07/06/2023 0.02  0.00 - 0.07 K/uL Final   Performed at [facility] Lab, [address], [place], [place] [address]   Sodium 07/06/2023 140  135 - 145 mmol/L Final   Potassium 07/06/2023 4.3  3.5 - 5.1 mmol/L Final   Chloride 07/06/2023 102  98 - 111 mmol/L Final   CO2 07/06/2023 30  22 - 32 mmol/L Final   Glucose, Bld 07/06/2023 68 (L)  70 - 99  mg/dL Final   Glucose reference range applies only to samples taken after fasting for at least 8 hours.   BUN 07/06/2023 18  6 - 20 mg/dL Final   Creatinine, Ser 07/06/2023 0.86  0.61 - 1.24 mg/dL Final  Calcium 07/06/2023 9.7  8.9 - 10.3 mg/dL Final   Total Protein 78/46/9629 6.9  6.5 - 8.1 g/dL Final   Albumin 52/84/1324 3.7  3.5 - 5.0 g/dL Final   AST 40/01/2724 37  15 - 41 U/L Final   ALT 07/06/2023 46 (H)  0 - 44 U/L Final   Alkaline Phosphatase 07/06/2023 52  38 - 126 U/L Final   Total Bilirubin 07/06/2023 0.6  0.0 - 1.2 mg/dL Final   GFR, Estimated 07/06/2023 >60  >60 mL/min Final   Comment: (NOTE) Calculated using the CKD-EPI Creatinine Equation (2021)    Anion gap 07/06/2023 8  5 - 15 Final   Performed at [facility], [address] [place], [place] [address]   Alcohol, Ethyl (B) 07/06/2023 <10  <10 mg/dL Final   Comment: (NOTE) Lowest detectable limit for serum alcohol is 10 mg/dL.  For medical purposes only. Performed at [facility], [address] [place], [place] [address]    Cholesterol 07/06/2023 169  0 - 200 mg/dL Final   Triglycerides 25/95/6387 42  <150 mg/dL Final   HDL 56/43/3295 56  >40 mg/dL Final   Total CHOL/HDL Ratio 07/06/2023 3.0  RATIO Final   VLDL 07/06/2023 8  0 - 40 mg/dL Final   LDL Cholesterol 07/06/2023 105 (H)  0 - 99 mg/dL Final   Comment:        Total Cholesterol/HDL:CHD Risk Coronary Heart Disease Risk Table                     Men   Women  1/2 Average Risk   3.4   3.3  Average Risk       5.0   4.4  2 X Average Risk   9.6   7.1  3 X Average Risk  23.4   11.0        Use the calculated Patient Ratio above and the CHD Risk Table to determine the patient's CHD Risk.        ATP III CLASSIFICATION (LDL):  <100     mg/dL   Optimal  188-416  mg/dL   Near or Above                    Optimal  130-159  mg/dL   Borderline  606-301  mg/dL   High  >601     mg/dL   Very High Performed at [facility], [address], [place], [place] [address]    TSH 07/06/2023 0.431  0.350 - 4.500 uIU/mL Final   Comment: Performed by a 3rd Generation assay with a functional sensitivity of <=0.01 uIU/mL. Performed at [facility], [address] [place], [place] [address]    Color, Urine 07/06/2023 YELLOW  YELLOW Final   APPearance 07/06/2023 HAZY (A)  CLEAR Final   Specific Gravity, Urine 07/06/2023 1.025  1.005 - 1.030 Final   pH 07/06/2023 5.0  5.0 - 8.0 Final   Glucose, UA 07/06/2023 50 (A)  NEGATIVE mg/dL Final   Hgb urine dipstick 07/06/2023 NEGATIVE  NEGATIVE Final   Bilirubin Urine 07/06/2023 NEGATIVE  NEGATIVE Final   Ketones, ur 07/06/2023 NEGATIVE  NEGATIVE mg/dL Final   Protein, ur 20/25/4270 NEGATIVE  NEGATIVE mg/dL Final   Nitrite 62/37/6283 NEGATIVE  NEGATIVE Final   Leukocytes,Ua 07/06/2023 NEGATIVE  NEGATIVE Final   RBC / HPF 07/06/2023 0-5  0 - 5 RBC/hpf Final   WBC, UA 07/06/2023 0-5  0 - 5 WBC/hpf Final   Bacteria, UA 07/06/2023 RARE (A)  NONE SEEN Final   Squamous Epithelial / HPF 07/06/2023 0-5  0 - 5 /HPF Final   Mucus 07/06/2023 PRESENT   Final   Ca Oxalate Crys, UA 07/06/2023 PRESENT   Final   Performed at [facility], [address] [place], [place] [address]   POC Amphetamine UR 07/06/2023 Positive (A)  NONE DETECTED (Cut Off Level 1000 ng/mL) Final   POC Secobarbital (BAR) 07/06/2023 None Detected  NONE DETECTED (Cut Off Level 300 ng/mL) Final   POC Buprenorphine (BUP) 07/06/2023 None Detected  NONE DETECTED (Cut Off Level 10 ng/mL) Final   POC Oxazepam (BZO) 07/06/2023 None Detected  NONE DETECTED (Cut Off Level 300 ng/mL) Final   POC Cocaine UR 07/06/2023 None Detected  NONE DETECTED (Cut Off Level 300 ng/mL) Final   POC Methamphetamine UR 07/06/2023 Positive (A)  NONE DETECTED (Cut Off Level 1000 ng/mL) Final   POC Morphine 07/06/2023 None Detected  NONE DETECTED (Cut Off Level 300 ng/mL) Final   POC Methadone UR 07/06/2023 None Detected  NONE DETECTED (Cut Off Level 300  ng/mL) Final   POC Oxycodone UR 07/06/2023 Positive (A)  NONE DETECTED (Cut Off Level 100 ng/mL) Final   POC Marijuana UR 07/06/2023 None Detected  NONE DETECTED (Cut Off Level 50 ng/mL) Final  Admission on 07/06/2023, Discharged on 07/06/2023  Component Date Value Ref Range Status   Sodium 07/06/2023 137  135 - 145 mmol/L Final   Potassium 07/06/2023 4.0  3.5 - 5.1 mmol/L Final   Chloride 07/06/2023 99  98 - 111 mmol/L Final   CO2 07/06/2023 30  22 - 32 mmol/L Final   Glucose, Bld 07/06/2023 92  70 - 99 mg/dL Final   Glucose reference range applies only to samples taken after fasting for at least 8 hours.   BUN 07/06/2023 20  6 - 20 mg/dL Final   Creatinine, Ser 07/06/2023 0.82  0.61 - 1.24 mg/dL Final   Calcium 19/14/7829 9.8  8.9 - 10.3 mg/dL Final   Total Protein 56/21/3086 7.1  6.5 - 8.1 g/dL Final   Albumin 57/84/6962 3.7  3.5 - 5.0 g/dL Final   AST 95/28/4132 34  15 - 41 U/L Final   ALT 07/06/2023 41  0 - 44 U/L Final   Alkaline Phosphatase 07/06/2023 51  38 - 126 U/L Final   Total Bilirubin 07/06/2023 0.8  0.0 - 1.2 mg/dL Final   GFR, Estimated 07/06/2023 >60  >60 mL/min Final   Comment: (NOTE) Calculated using the CKD-EPI Creatinine Equation (2021)    Anion gap 07/06/2023 8  5 - 15 Final   Performed at [facility], [address] [place], [place] [address]   Alcohol, Ethyl (B) 07/06/2023 <10  <10 mg/dL Final   Comment: (NOTE) Lowest detectable limit for serum alcohol is 10 mg/dL.  For medical purposes only. Performed at [facility], [address] [place], [place] [address]    WBC 07/06/2023 8.7  4.0 - 10.5 K/uL Final   RBC 07/06/2023 4.63  4.22 - 5.81 MIL/uL Final   Hemoglobin 07/06/2023 13.7  13.0 - 17.0 g/dL Final   HCT 66/44/0347 41.2  39.0 - 52.0 % Final   MCV 07/06/2023 89.0  80.0 - 100.0 fL Final   MCH 07/06/2023 29.6  26.0 - 34.0 pg Final   MCHC 07/06/2023 33.3  30.0 - 36.0 g/dL Final   RDW 42/59/5638 12.0  11.5 -  15.5 % Final   Platelets  07/06/2023 325  150 - 400 K/uL Final   nRBC 07/06/2023 0.0  0.0 - 0.2 % Final   Performed at [facility], [address] [place], [place] [address]   Opiates 07/06/2023 NONE DETECTED  NONE DETECTED Final   Cocaine 07/06/2023 NONE DETECTED  NONE DETECTED Final   Benzodiazepines 07/06/2023 NONE DETECTED  NONE DETECTED Final   Amphetamines 07/06/2023 POSITIVE (A)  NONE DETECTED Final   Comment: (NOTE) Trazodone is metabolized in vivo to several metabolites, including pharmacologically active m-CPP, which is excreted in the urine. Immunoassay screens for amphetamines and MDMA have potential cross-reactivity with these compounds and may provide false positive  results.     Tetrahydrocannabinol 07/06/2023 NONE DETECTED  NONE DETECTED Final   Barbiturates 07/06/2023 NONE DETECTED  NONE DETECTED Final   Comment: (NOTE) DRUG SCREEN FOR MEDICAL PURPOSES ONLY.  IF CONFIRMATION IS NEEDED FOR ANY PURPOSE, NOTIFY LAB WITHIN 5 DAYS.  LOWEST DETECTABLE LIMITS FOR URINE DRUG SCREEN Drug Class                     Cutoff (ng/mL) Amphetamine and metabolites    1000 Barbiturate and metabolites    200 Benzodiazepine                 200 Opiates and metabolites        300 Cocaine and metabolites        300 THC                            50 Performed at [facility], [address], [place], [place] [address]   Admission on 06/29/2023, Discharged on 07/04/2023  Component Date Value Ref Range Status   Specimen Description 06/29/2023 BLOOD RIGHT HAND   Final   Special Requests 06/29/2023 BOTTLES DRAWN AEROBIC AND ANAEROBIC Blood Culture adequate volume   Final   Culture 06/29/2023    Final                   Value:NO GROWTH 5 DAYS Performed at [facility], [address], [place], [place] [address]    Report Status 06/29/2023 07/04/2023 FINAL   Final   Specimen Description 06/29/2023 BLOOD LEFT ANTECUBITAL   Final   Special Requests 06/29/2023 BOTTLES DRAWN AEROBIC AND ANAEROBIC  Blood Culture adequate volume   Final   Culture 06/29/2023    Final                   Value:NO GROWTH 5 DAYS Performed at [facility], [address], [place], [place] [address]    Report Status 06/29/2023 07/05/2023 FINAL   Final   WBC 06/29/2023 10.1  4.0 - 10.5 K/uL Final   RBC 06/29/2023 4.62  4.22 - 5.81 MIL/uL Final   Hemoglobin 06/29/2023 14.2  13.0 - 17.0 g/dL Final   HCT 29/56/2130 40.3  39.0 - 52.0 % Final   MCV 06/29/2023 87.2  80.0 - 100.0 fL Final   MCH 06/29/2023 30.7  26.0 - 34.0 pg Final   MCHC 06/29/2023 35.2  30.0 - 36.0 g/dL Final   RDW 86/57/8469 12.3  11.5 - 15.5 % Final   Platelets 06/29/2023 PLATELET CLUMPS NOTED ON SMEAR, UNABLE TO ESTIMATE  150 - 400 K/uL Final   nRBC 06/29/2023 0.0  0.0 - 0.2 % Final   Neutrophils Relative % 06/29/2023 73  %  Final   Neutro Abs 06/29/2023 7.4  1.7 - 7.7 K/uL Final   Lymphocytes Relative 06/29/2023 14  % Final   Lymphs Abs 06/29/2023 1.4  0.7 - 4.0 K/uL Final   Monocytes Relative 06/29/2023 12  % Final   Monocytes Absolute 06/29/2023 1.2 (H)  0.1 - 1.0 K/uL Final   Eosinophils Relative 06/29/2023 1  % Final   Eosinophils Absolute 06/29/2023 0.1  0.0 - 0.5 K/uL Final   Basophils Relative 06/29/2023 0  % Final   Basophils Absolute 06/29/2023 0.0  0.0 - 0.1 K/uL Final   WBC Morphology 06/29/2023 MORPHOLOGY UNREMARKABLE   Final   RBC Morphology 06/29/2023 MORPHOLOGY UNREMARKABLE   Final   Smear Review 06/29/2023 PLATELET CLUMPS NOTED ON SMEAR, UNABLE TO ESTIMATE   Final   Immature Granulocytes 06/29/2023 0  % Final   Abs Immature Granulocytes 06/29/2023 0.04  0.00 - 0.07 K/uL Final   Performed at [facility], [address], [place], [place] [address]   Sodium 06/29/2023 137  135 - 145 mmol/L Final   Potassium 06/29/2023 3.8  3.5 - 5.1 mmol/L Final   Chloride 06/29/2023 103  98 - 111 mmol/L Final   CO2 06/29/2023 25  22 - 32 mmol/L Final   Glucose, Bld 06/29/2023 110 (H)  70 - 99 mg/dL Final   Glucose reference  range applies only to samples taken after fasting for at least 8 hours.   BUN 06/29/2023 9  6 - 20 mg/dL Final   Creatinine, Ser 06/29/2023 0.73  0.61 - 1.24 mg/dL Final   Calcium 78/29/5621 9.5  8.9 - 10.3 mg/dL Final   Total Protein 30/86/5784 7.0  6.5 - 8.1 g/dL Final   Albumin 69/62/9528 3.7  3.5 - 5.0 g/dL Final   AST 41/32/4401 21  15 - 41 U/L Final   ALT 06/29/2023 20  0 - 44 U/L Final   Alkaline Phosphatase 06/29/2023 56  38 - 126 U/L Final   Total Bilirubin 06/29/2023 1.0  0.0 - 1.2 mg/dL Final   GFR, Estimated 06/29/2023 >60  >60 mL/min Final   Comment: (NOTE) Calculated using the CKD-EPI Creatinine Equation (2021)    Anion gap 06/29/2023 9  5 - 15 Final   Performed at [facility], [address], [place], [place] [address]   Lactic Acid, Venous 06/30/2023 1.0  0.5 - 1.9 mmol/L Final   Performed at [facility], [address], [place], [place] [address]   Lactic Acid, Venous 06/30/2023 1.3  0.5 - 1.9 mmol/L Final   Performed at [facility], [address] [place], [place] [address]   WBC 06/30/2023 10.7 (H)  4.0 - 10.5 K/uL Final   RBC 06/30/2023 4.35  4.22 - 5.81 MIL/uL Final   Hemoglobin 06/30/2023 13.0  13.0 - 17.0 g/dL Final   HCT 25/95/6387 38.4 (L)  39.0 - 52.0 % Final   MCV 06/30/2023 88.3  80.0 - 100.0 fL Final   MCH 06/30/2023 29.9  26.0 - 34.0 pg Final   MCHC 06/30/2023 33.9  30.0 - 36.0 g/dL Final   RDW 56/43/3295 12.1  11.5 - 15.5 % Final   Platelets 06/30/2023 PLATELET CLUMPS NOTED ON SMEAR, UNABLE TO ESTIMATE  150 - 400 K/uL Final   Comment: SPECIMEN CHECKED FOR CLOTS Immature Platelet Fraction may be clinically indicated, consider ordering this additional test [id number] REPEATED TO VERIFY    nRBC 06/30/2023 0.0  0.0 - 0.2 % Final   Performed at  [facility], [address] [place], [place] [address]   Sodium 06/30/2023 133 (L)  135 - 145 mmol/L Final   Potassium 06/30/2023 4.4  3.5 - 5.1 mmol/L Final   Chloride 06/30/2023  104  98 - 111 mmol/L Final   CO2 06/30/2023 24  22 - 32 mmol/L Final   Glucose, Bld 06/30/2023 208 (H)  70 - 99 mg/dL Final   Glucose reference range applies only to samples taken after fasting for at least 8 hours.   BUN 06/30/2023 10  6 - 20 mg/dL Final   Creatinine, Ser 06/30/2023 0.86  0.61 - 1.24 mg/dL Final   Calcium 60/45/4098 8.9  8.9 - 10.3 mg/dL Final   GFR, Estimated 06/30/2023 >60  >60 mL/min Final   Comment: (NOTE) Calculated using the CKD-EPI Creatinine Equation (2021)    Anion gap 06/30/2023 5  5 - 15 Final   Performed at [facility], [address], [place], [place] [address]   Magnesium 06/30/2023 2.0  1.7 - 2.4 mg/dL Final   Performed at [facility], [address], [place], [place] [address]   Phosphorus 06/30/2023 2.2 (L)  2.5 - 4.6 mg/dL Final   Performed at [facility] Lab, [address], [place], [place] [address]   Specimen Description 06/30/2023 OTHER   Final   Special Requests 06/30/2023 Finger R   Final   Gram Stain 06/30/2023    Final                   Value:RARE WBC PRESENT, PREDOMINANTLY PMN FEW GRAM POSITIVE COCCI    Culture 06/30/2023    Final                   Value:ABUNDANT METHICILLIN RESISTANT STAPHYLOCOCCUS AUREUS NO ANAEROBES ISOLATED Performed at [facility], [address], [place], [place] [address]    Report Status 06/30/2023 07/05/2023 FINAL   Final   Organism ID, Bacteria 06/30/2023 METHICILLIN RESISTANT STAPHYLOCOCCUS AUREUS   Final   Magnesium 07/01/2023 1.9  1.7 - 2.4 mg/dL Final   Performed at [facility] Lab, [address], [place], [place] [address]   WBC 07/01/2023 11.2 (H)  4.0 - 10.5 K/uL Final   RBC 07/01/2023 4.48  4.22 - 5.81 MIL/uL Final   Hemoglobin 07/01/2023 13.6  13.0 - 17.0 g/dL Final   HCT 95/28/4132 39.9  39.0 - 52.0 % Final   MCV 07/01/2023 89.1  80.0 - 100.0 fL Final   MCH 07/01/2023 30.4  26.0 - 34.0 pg Final   MCHC 07/01/2023 34.1  30.0 - 36.0 g/dL Final   RDW 44/04/270 12.2  11.5  - 15.5 % Final   Platelets 07/01/2023 164  150 - 400 K/uL Final   nRBC 07/01/2023 0.0  0.0 - 0.2 % Final   Neutrophils Relative % 07/01/2023 59  % Final   Neutro Abs 07/01/2023 6.7  1.7 - 7.7 K/uL Final   Lymphocytes Relative 07/01/2023 28  % Final   Lymphs Abs 07/01/2023 3.1  0.7 - 4.0 K/uL Final   Monocytes Relative 07/01/2023 11  % Final   Monocytes Absolute 07/01/2023 1.2 (H)  0.1 - 1.0 K/uL Final   Eosinophils Relative 07/01/2023 2  % Final   Eosinophils Absolute 07/01/2023 0.2  0.0 - 0.5 K/uL Final   Basophils Relative 07/01/2023 0  % Final   Basophils Absolute 07/01/2023 0.1  0.0 - 0.1 K/uL Final   Immature Granulocytes 07/01/2023 0  %  Final   Abs Immature Granulocytes 07/01/2023 0.03  0.00 - 0.07 K/uL Final   Performed at [facility] Lab, [address], [place], [place] [address]   Sodium 07/01/2023 137  135 - 145 mmol/L Final   Potassium 07/01/2023 3.9  3.5 - 5.1 mmol/L Final   Chloride 07/01/2023 101  98 - 111 mmol/L Final   CO2 07/01/2023 29  22 - 32 mmol/L Final   Glucose, Bld 07/01/2023 103 (H)  70 - 99 mg/dL Final   Glucose reference range applies only to samples taken after fasting for at least 8 hours.   BUN 07/01/2023 10  6 - 20 mg/dL Final   Creatinine, Ser 07/01/2023 0.79  0.61 - 1.24 mg/dL Final   Calcium 02/72/5366 8.9  8.9 - 10.3 mg/dL Final   Phosphorus 44/06/4740 3.4  2.5 - 4.6 mg/dL Final   Albumin 59/56/3875 3.0 (L)  3.5 - 5.0 g/dL Final   GFR, Estimated 07/01/2023 >60  >60 mL/min Final   Comment: (NOTE) Calculated using the CKD-EPI Creatinine Equation (2021)    Anion gap 07/01/2023 7  5 - 15 Final   Performed at [facility] Lab, [address], [place], [place] [address]   WBC 07/02/2023 7.3  4.0 - 10.5 K/uL Final   RBC 07/02/2023 4.75  4.22 - 5.81 MIL/uL Final   Hemoglobin 07/02/2023 14.3  13.0 - 17.0 g/dL Final   HCT 95/18/8416 41.6  39.0 - 52.0 % Final   MCV 07/02/2023 87.6  80.0 - 100.0 fL Final   MCH 07/02/2023 30.1  26.0 - 34.0 pg Final    MCHC 07/02/2023 34.4  30.0 - 36.0 g/dL Final   RDW 60/63/0160 12.0  11.5 - 15.5 % Final   Platelets 07/02/2023 185  150 - 400 K/uL Final   nRBC 07/02/2023 0.0  0.0 - 0.2 % Final   Performed at [facility], [address] [place], [place] [address]   hep A Total Ab 07/02/2023 NON REACTIVE  NON REACTIVE Final   Performed at [facility], [address], [place], [place] [address]   Hepatitis B Surface Ag 07/02/2023 NON REACTIVE  NON REACTIVE Final   Performed at [facility], [address] [place], [place] [address]   Hep B S AB Quant (Post) 07/02/2023 3.6 (L)  Immunity>10 mIU/mL Final   Comment: (NOTE)  Status of Immunity                     Anti-HBs Level  ------------------                     -------------- Inconsistent with Immunity                  0.0 - 10.0 Consistent with Immunity                         >10.0 Performed At: [facility] [address] [place], [place] [id number] Jolene Schimke MD EG:[id number]    HCV Ab 07/02/2023 Non Reactive  Non Reactive Final   Comment: (NOTE) Performed At: [facility] [address] [place], [place] [id number] Jolene Schimke MD [id number]    HIV Screen 4th Generation wRfx 07/02/2023 Non Reactive  Non Reactive Final   Performed at [facility], [address], [place], [place] [address]   Sed Rate 07/02/2023 18 (H)  0 - 16 mm/hr Final  Performed at [facility], [address] [place], [place] [address]   CRP 07/02/2023 3.0 (H)  <1.0 mg/dL Final   Performed at [facility], [address] [place], [place] [address]   MRSA by PCR Next Gen 07/03/2023 DETECTED (A)  NOT DETECTED Final   Comment: RESULT CALLED TO, READ BACK BY AND VERIFIED WITH: RN Alvina Filbert [phone] @ [phone] (NOTE) The GeneXpert MRSA Assay (FDA approved for NASAL specimens only), is one component of a comprehensive MRSA colonization surveillance program. It is not intended to diagnose MRSA infection nor to  guide or monitor treatment for MRSA infections. Test performance is not FDA approved in patients less than 45 years old. Performed at [facility], [address], [place], [place] [address]    HCV Interp 1: 07/02/2023 Comment   Final   Comment: (NOTE) Not infected with HCV unless early or acute infection is suspected (which may be delayed in an immunocompromised individual), or other evidence exists to indicate HCV infection. Performed At: [facility] [address] [place], [place] [id number] Jolene Schimke MD UV:[id number]     Blood Alcohol level:  Lab Results  Component Value Date   [facility] <10 07/06/2023   ETH <10 07/06/2023   Metabolic Disorder Labs: No results found for: "HGBA1C", "MPG" No results found for: "PROLACTIN" Lab Results  Component Value Date   CHOL 169 07/06/2023   TRIG 42 07/06/2023   HDL 56 07/06/2023   CHOLHDL 3.0 07/06/2023   VLDL 8 07/06/2023   LDLCALC 105 (H) 07/06/2023   Therapeutic Lab Levels: No results found for: "LITHIUM" No results found for: "VALPROATE" No results found for: "CBMZ"  Physical Findings   Flowsheet Row ED from 07/06/2023 in [facility] Most recent reading at 07/06/2023  6:55 PM ED from 07/06/2023 in [facility] Emergency Department at [facility] Most recent reading at 07/06/2023  2:29 AM ED to Hosp-Admission (Discharged) from 06/29/2023 in [facility] Most recent reading at 06/29/2023 12:00 PM  C-SSRS RISK CATEGORY No Risk No Risk No Risk      Musculoskeletal  Strength & Muscle Tone: within normal limits Gait & Station: normal Patient leans: N/A  Psychiatric Specialty Exam  Presentation  General Appearance:  Bizarre; Disheveled  Eye Contact: Fleeting  Speech: Garbled; Slurred  Speech Volume: Increased  Handedness: Right  Mood and Affect  Mood: Angry  Affect: Constricted; Blunt  Thought Process  Thought Processes: Linear;  Disorganized  Descriptions of Associations:Tangential  Orientation:Full (Time, Place and Person)  Thought Content:Illusions; Rumination; Scattered     Hallucinations:Hallucinations: None  Ideas of Reference:None  Suicidal Thoughts:Suicidal Thoughts: No  Homicidal Thoughts:Homicidal Thoughts: No  Sensorium  Memory: Immediate Poor; Remote Poor; Recent Poor  Judgment: Poor  Insight: Poor  Executive Functions  Concentration: Fair  Attention Span: Fair  Recall: Poor  Fund of Knowledge: Poor  Language: Poor  Psychomotor Activity  Psychomotor Activity: Psychomotor Activity: Increased  Assets  Assets: Physical Health; Resilience (Homelessness)  Sleep  Sleep: Sleep: Good Number of Hours of Sleep: 8  Nutritional Assessment (For OBS and FBC admissions only) Has the patient had a weight loss or gain of 10 pounds or more in the last 3 months?: No Has the patient had a decrease in food intake/or appetite?: No Does the patient have dental problems?: No Does the patient have eating habits or behaviors that may be indicators of an eating disorder including binging or inducing  vomiting?: No Has the patient recently lost weight without trying?: 0 Has the patient been eating poorly because of a decreased appetite?: 0 Malnutrition Screening Tool Score: 0   Physical Exam  Physical Exam Vitals and nursing note reviewed.  Constitutional:      Appearance: He is normal weight.  HENT:     Head: Normocephalic.     Right Ear: External ear normal.     Left Ear: External ear normal.     Nose: Nose normal.     Mouth/Throat:     Mouth: Mucous membranes are moist.     Pharynx: Oropharynx is clear.  Eyes:     Extraocular Movements: Extraocular movements intact.  Cardiovascular:     Rate and Rhythm: Normal rate.     Pulses: Normal pulses.  Pulmonary:     Effort: Pulmonary effort is normal.  Abdominal:     Comments: Deferred  Genitourinary:    Comments:  Deferred  Musculoskeletal:        General: Normal range of motion.     Cervical back: Normal range of motion.  Skin:    General: Skin is warm.  Neurological:     General: No focal deficit present.     Mental Status: He is alert and oriented to person, place, and time.  Psychiatric:     Comments: Agitated and irritable.    Review of Systems  Constitutional:  Negative for chills and fever.  HENT:  Negative for hearing loss and sore throat.   Eyes:  Negative for blurred vision.  Respiratory:  Negative for cough, sputum production, shortness of breath and wheezing.   Cardiovascular:  Negative for chest pain and palpitations.  Gastrointestinal:  Negative for heartburn and nausea.  Genitourinary:  Negative for dysuria, frequency and urgency.  Musculoskeletal:  Negative for myalgias.  Skin:  Negative for itching and rash.  Neurological:  Negative for dizziness and headaches.  Endo/Heme/Allergies:        See allergy listing  Psychiatric/Behavioral:  Negative for depression, hallucinations, memory loss, substance abuse and suicidal ideas. The patient is not nervous/anxious and does not have insomnia.    Blood pressure 99/67, pulse 70, temperature (!) 97.3 F (36.3 C), temperature source Oral, resp. rate 16, SpO2 98%. There is no height or weight on file to calculate BMI.  Treatment Plan Summary: Daily contact with patient to assess and evaluate symptoms and progress in treatment and Medication management  Plans: Medications: ascorbic acid (VITAMIN C) 1000 MG tablet Take 1 tablet (1,000 mg total) by mouth daily.        Patient not taking: Reported on 07/06/2023      doxycycline (VIBRA-TABS) 100 MG tablet Take 1 tablet (100 mg total) by mouth 2 (two) times daily.        Patient not taking: Reported on 07/06/2023      nicotine (NICODERM CQ - DOSED IN MG/24 HOURS) 14 mg/24hr patch Place 1 patch (14 mg total) onto the skin daily.      polyethylene glycol powder (GLYCOLAX/MIRALAX) 17  GM/SCOOP powder Take 17 g by mouth daily as needed for mild constipation.               Cecilie Lowers, FNP 07/07/2023 11:05 AM

## 2023-07-08 ENCOUNTER — Other Ambulatory Visit (HOSPITAL_COMMUNITY)
Admission: EM | Admit: 2023-07-08 | Discharge: 2023-07-10 | Disposition: A | Discharge status: Home / Self Care | Attending: Psychiatry | Admitting: Psychiatry

## 2023-07-08 ENCOUNTER — Ambulatory Visit (HOSPITAL_COMMUNITY)
Admission: EM | Admit: 2023-07-08 | Discharge: 2023-07-08 | Disposition: A | Discharge status: Other Acute Inpatient Hospital | Attending: Nurse Practitioner | Admitting: Nurse Practitioner

## 2023-07-08 DIAGNOSIS — R45850 Homicidal ideations: Secondary | ICD-10-CM | POA: Insufficient documentation

## 2023-07-08 DIAGNOSIS — F1910 Other psychoactive substance abuse, uncomplicated: Secondary | ICD-10-CM

## 2023-07-08 DIAGNOSIS — F1510 Other stimulant abuse, uncomplicated: Secondary | ICD-10-CM | POA: Insufficient documentation

## 2023-07-08 DIAGNOSIS — F419 Anxiety disorder, unspecified: Secondary | ICD-10-CM

## 2023-07-08 DIAGNOSIS — Z5900 Homelessness unspecified: Secondary | ICD-10-CM | POA: Insufficient documentation

## 2023-07-08 DIAGNOSIS — F1120 Opioid dependence, uncomplicated: Secondary | ICD-10-CM | POA: Diagnosis present

## 2023-07-08 DIAGNOSIS — F1911 Other psychoactive substance abuse, in remission: Secondary | ICD-10-CM | POA: Diagnosis not present

## 2023-07-08 DIAGNOSIS — F1920 Other psychoactive substance dependence, uncomplicated: Secondary | ICD-10-CM | POA: Diagnosis present

## 2023-07-08 LAB — POCT URINE DRUG SCREEN - MANUAL ENTRY (I-SCREEN)
POC Amphetamine UR: NOT DETECTED
POC Buprenorphine (BUP): NOT DETECTED
POC Cocaine UR: NOT DETECTED
POC Marijuana UR: NOT DETECTED
POC Methadone UR: NOT DETECTED
POC Methamphetamine UR: NOT DETECTED
POC Morphine: NOT DETECTED
POC Oxazepam (BZO): NOT DETECTED
POC Oxycodone UR: POSITIVE — AB
POC Secobarbital (BAR): NOT DETECTED

## 2023-07-08 LAB — ETHANOL: Alcohol, Ethyl (B): <10 mg/dL (ref <10)

## 2023-07-08 MED ORDER — HYDROXYZINE HCL 25 MG PO TABS: 25.0000 mg | ORAL_TABLET | Freq: Four times a day (QID) | ORAL | Status: DC | PRN

## 2023-07-08 MED ORDER — DIPHENHYDRAMINE HCL 50 MG PO CAPS: 50.0000 mg | ORAL_CAPSULE | Freq: Three times a day (TID) | ORAL | Status: DC | PRN

## 2023-07-08 MED ORDER — HYDROXYZINE HCL 25 MG PO TABS: 25.0000 mg | ORAL_TABLET | Freq: Three times a day (TID) | ORAL | Status: DC | PRN

## 2023-07-08 MED ORDER — HALOPERIDOL 5 MG PO TABS: 5.0000 mg | ORAL_TABLET | Freq: Three times a day (TID) | ORAL | Status: DC | PRN

## 2023-07-08 MED ORDER — LORAZEPAM 2 MG/ML IJ SOLN: 2.0000 mg | Freq: Three times a day (TID) | INTRAMUSCULAR | Status: DC | PRN

## 2023-07-08 MED ORDER — HALOPERIDOL LACTATE 5 MG/ML IJ SOLN: 10.0000 mg | Freq: Three times a day (TID) | INTRAMUSCULAR | Status: DC | PRN

## 2023-07-08 MED ORDER — CLONIDINE HCL 0.1 MG PO TABS
0.1000 mg | ORAL_TABLET | Freq: Four times a day (QID) | ORAL | Status: DC
  Filled 2023-07-08 (×2): qty 1

## 2023-07-08 MED ORDER — METHOCARBAMOL 500 MG PO TABS: 500.0000 mg | ORAL_TABLET | Freq: Three times a day (TID) | ORAL | Status: DC | PRN

## 2023-07-08 MED ORDER — CLONIDINE HCL 0.1 MG PO TABS: 0.1000 mg | ORAL_TABLET | ORAL | Status: DC

## 2023-07-08 MED ORDER — DIPHENHYDRAMINE HCL 50 MG/ML IJ SOLN: 50.0000 mg | Freq: Three times a day (TID) | INTRAMUSCULAR | Status: DC | PRN

## 2023-07-08 MED ORDER — HALOPERIDOL LACTATE 5 MG/ML IJ SOLN: 5.0000 mg | Freq: Three times a day (TID) | INTRAMUSCULAR | Status: DC | PRN

## 2023-07-08 MED ORDER — DICYCLOMINE HCL 20 MG PO TABS: 20.0000 mg | ORAL_TABLET | Freq: Four times a day (QID) | ORAL | Status: DC | PRN

## 2023-07-08 MED ORDER — DOXYCYCLINE HYCLATE 100 MG PO TABS
100.0000 mg | ORAL_TABLET | Freq: Two times a day (BID) | ORAL | Status: DC
  Administered 2023-07-08 – 2023-07-10 (×4): 100 mg via ORAL
  Filled 2023-07-08 (×4): qty 1

## 2023-07-08 MED ORDER — LOPERAMIDE HCL 2 MG PO CAPS: 2.0000 mg | ORAL_CAPSULE | ORAL | Status: DC | PRN

## 2023-07-08 MED ORDER — NAPROXEN 500 MG PO TABS
500.0000 mg | ORAL_TABLET | Freq: Two times a day (BID) | ORAL | Status: DC | PRN
  Administered 2023-07-08: 500 mg via ORAL
  Filled 2023-07-08: qty 1

## 2023-07-08 MED ORDER — ACETAMINOPHEN 325 MG PO TABS
650.0000 mg | ORAL_TABLET | Freq: Four times a day (QID) | ORAL | Status: DC | PRN
  Administered 2023-07-09: 650 mg via ORAL
  Filled 2023-07-08: qty 2

## 2023-07-08 MED ORDER — TRAZODONE HCL 50 MG PO TABS: 50.0000 mg | ORAL_TABLET | Freq: Every evening | ORAL | Status: DC | PRN

## 2023-07-08 MED ORDER — MAGNESIUM HYDROXIDE 400 MG/5ML PO SUSP: 30.0000 mL | Freq: Every day | ORAL | Status: DC | PRN

## 2023-07-08 MED ORDER — ONDANSETRON 4 MG PO TBDP: 4.0000 mg | ORAL_TABLET | Freq: Four times a day (QID) | ORAL | Status: DC | PRN

## 2023-07-08 MED ORDER — CLONIDINE HCL 0.1 MG PO TABS: 0.1000 mg | ORAL_TABLET | Freq: Every day | ORAL | Status: DC

## 2023-07-08 MED ORDER — THIAMINE MONONITRATE 100 MG PO TABS
100.0000 mg | ORAL_TABLET | Freq: Every day | ORAL | Status: DC
  Administered 2023-07-10: 100 mg via ORAL
  Filled 2023-07-08 (×2): qty 1

## 2023-07-08 MED ORDER — ALUM & MAG HYDROXIDE-SIMETH 200-200-20 MG/5ML PO SUSP: 30.0000 mL | ORAL | Status: DC | PRN

## 2023-07-08 MED ORDER — DOXYCYCLINE HYCLATE 100 MG PO TABS
100.0000 mg | ORAL_TABLET | Freq: Two times a day (BID) | ORAL | Status: DC
  Administered 2023-07-08: 100 mg via ORAL
  Filled 2023-07-08: qty 1

## 2023-07-08 MED ORDER — ACETAMINOPHEN 325 MG PO TABS
650.0000 mg | ORAL_TABLET | Freq: Four times a day (QID) | ORAL | Status: DC | PRN
  Administered 2023-07-08: 650 mg via ORAL
  Filled 2023-07-08: qty 2

## 2023-07-08 MED ORDER — HYDROXYZINE HCL 25 MG PO TABS
25.0000 mg | ORAL_TABLET | Freq: Three times a day (TID) | ORAL | Status: DC | PRN
  Filled 2023-07-08: qty 1

## 2023-07-08 MED ORDER — ADULT MULTIVITAMIN W/MINERALS CH
1.0000 | ORAL_TABLET | Freq: Every day | ORAL | Status: DC
  Administered 2023-07-08 – 2023-07-10 (×2): 1 via ORAL
  Filled 2023-07-08 (×3): qty 1

## 2023-07-08 MED ORDER — NAPROXEN 500 MG PO TABS: 500.0000 mg | ORAL_TABLET | Freq: Two times a day (BID) | ORAL | Status: DC | PRN

## 2023-07-08 MED ORDER — LOPERAMIDE HCL 2 MG PO CAPS
2.0000 mg | ORAL_CAPSULE | ORAL | Status: DC | PRN
Start: 2023-07-08 — End: 2023-07-08

## 2023-07-08 MED ORDER — TRAZODONE HCL 50 MG PO TABS
50.0000 mg | ORAL_TABLET | Freq: Every evening | ORAL | Status: DC | PRN
  Administered 2023-07-08 – 2023-07-09 (×2): 50 mg via ORAL
  Filled 2023-07-08 (×2): qty 1

## 2023-07-08 MED ORDER — LORAZEPAM 1 MG PO TABS: 1.0000 mg | ORAL_TABLET | Freq: Four times a day (QID) | ORAL | Status: DC | PRN
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
## 2023-07-08 NOTE — ED Provider Notes (Signed)
 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   [name], 28 y.o., male patient seen face to face by this provide  and chart reviewed on 07/08/23.  On evaluation [name] reports that he has been using methamphetamine and fentanyl and wants to detox. Patient reports that he used meth and fentanyl yesterday.    During evaluation [name] is sitting in the assessment room in no acute distress.  He is alert, oriented x 4, calm, cooperative and attentive. His mood is euthymic with congruent affect.  He has normal speech, and behavior.  Objectively there is no evidence of psychosis/mania or delusional thinking.  Patient is able to converse coherently, goal directed thoughts, no distractibility, or pre-occupation. He also denies suicidal/self-harm/homicidal ideation, psychosis, and paranoia.  Patient answered question appropriately.     Patient will be admitted to [facility] continuous observation for crisis management  safety, and stabilization. Total Time spent with patient: 20 minutes  Musculoskeletal  Strength & Muscle Tone: within normal limits Gait & Station: normal Patient leans: N/A  Psychiatric Specialty Exam  Presentation General Appearance:  Disheveled  Eye Contact: Fleeting  Speech: Clear and Coherent  Speech Volume: Normal  Handedness: Right   Mood and Affect  Mood: Euthymic  Affect: Appropriate   Thought Process  Thought Processes: Coherent  Descriptions of Associations:Intact  Orientation:Full (Time, Place and  Person)  Thought Content:WDL    Hallucinations:Hallucinations: None  Ideas of Reference:None  Suicidal Thoughts:Suicidal Thoughts: No  Homicidal Thoughts:Homicidal Thoughts: No   Sensorium  Memory: Immediate Fair; Recent Fair; Remote Fair  Judgment: Poor  Insight: Poor   Executive Functions  Concentration: Fair  Attention Span: Fair  Recall: [employer] of Knowledge: Fair  Language: Fair   Psychomotor Activity  Psychomotor Activity: Psychomotor Activity: Normal   Assets  Assets: Communication Skills; Desire for Improvement; Resilience   Sleep  Sleep: Sleep: Fair Number of Hours of Sleep: 5   Nutritional Assessment (For OBS and FBC admissions only) Has the patient had a weight loss or gain of 10 pounds or more in the last 3 months?: No Has the patient had a decrease in food intake/or appetite?: No Does the patient have dental problems?: No Does the patient have eating habits or behaviors that may be indicators of an eating disorder including binging or inducing vomiting?: No Has the patient recently lost weight without trying?: 0 Has the patient been eating poorly because of a decreased appetite?: 0 Malnutrition Screening Tool Score: 0    Physical Exam HENT:     Head: Normocephalic.     Nose: Nose normal.  Eyes:     Pupils: Pupils are equal, round, and reactive to light.  Cardiovascular:     Rate and Rhythm: Normal rate.  Pulmonary:     Effort: Pulmonary effort is normal.  Abdominal:     General: Abdomen is flat.  Musculoskeletal:     Cervical back: Normal range of motion.  Skin:    General: Skin is warm.  Neurological:     Mental Status: He is alert and oriented to person, place,  and time.  Psychiatric:        Attention and Perception: Attention normal.        Mood and Affect: Mood normal.        Speech: Speech normal.        Behavior: Behavior is cooperative.        Thought Content: Thought content is not paranoid or  delusional. Thought content does not include homicidal or suicidal ideation. Thought content does not include homicidal or suicidal plan.        Cognition and Memory: Cognition normal.        Judgment: Judgment is impulsive.    Review of Systems  Constitutional: Negative.   HENT: Negative.    Eyes: Negative.   Respiratory: Negative.    Cardiovascular: Negative.   Gastrointestinal: Negative.   Genitourinary: Negative.   Musculoskeletal: Negative.   Skin: Negative.   Neurological: Negative.   Endo/Heme/Allergies: Negative.   Psychiatric/Behavioral:  Positive for substance abuse.     Blood pressure 112/68, pulse 77, temperature 98.1 F (36.7 C), temperature source Oral, resp. rate 20, SpO2 99%. There is no height or weight on file to calculate BMI.  Past Psychiatric History: Bhuc  Is the patient at risk to self? No  Has the patient been a risk to self in the past 6 months? No .    Has the patient been a risk to self within the distant past? No   Is the patient a risk to others? No   Has the patient been a risk to others in the past 6 months? No   Has the patient been a risk to others within the distant past? No   Past Medical History:  Past Medical History:  Diagnosis Date   Acid reflux    Asthma      Family History:  unknown  Social History: 28 y/o male, single, unemployed and homeless, h/o polysubstance abuse   Last Labs:  Admission on 07/08/2023  Component Date Value Ref Range Status   POC Amphetamine UR 07/08/2023 None Detected  NONE DETECTED (Cut Off Level 1000 ng/mL) Final   POC Secobarbital (BAR) 07/08/2023 None Detected  NONE DETECTED (Cut Off Level 300 ng/mL) Final   POC Buprenorphine (BUP) 07/08/2023 None Detected  NONE DETECTED (Cut Off Level 10 ng/mL) Final   POC Oxazepam (BZO) 07/08/2023 None Detected  NONE DETECTED (Cut Off Level 300 ng/mL) Final   POC Cocaine UR 07/08/2023 None Detected  NONE DETECTED (Cut Off Level 300 ng/mL) Final   POC Methamphetamine  UR 07/08/2023 None Detected  NONE DETECTED (Cut Off Level 1000 ng/mL) Final   POC Morphine 07/08/2023 None Detected  NONE DETECTED (Cut Off Level 300 ng/mL) Final   POC Methadone UR 07/08/2023 None Detected  NONE DETECTED (Cut Off Level 300 ng/mL) Final   POC Oxycodone UR 07/08/2023 Positive (A)  NONE DETECTED (Cut Off Level 100 ng/mL) Final   POC Marijuana UR 07/08/2023 None Detected  NONE DETECTED (Cut Off Level 50 ng/mL) Final  Admission on 07/06/2023, Discharged on 07/07/2023  Component Date Value Ref Range Status   WBC 07/06/2023 6.9  4.0 - 10.5 K/uL Final   RBC 07/06/2023 4.61  4.22 - 5.81 MIL/uL Final   Hemoglobin 07/06/2023 14.1  13.0 - 17.0 g/dL Final   HCT 96/29/5284 40.6  39.0 - 52.0 % Final   MCV 07/06/2023 88.1  80.0 - 100.0 fL Final   MCH 07/06/2023 30.6  26.0 - 34.0 pg Final   MCHC 07/06/2023 34.7  30.0 -  36.0 g/dL Final   RDW 96/29/5284 12.0  11.5 - 15.5 % Final   Platelets 07/06/2023 289  150 - 400 K/uL Final   nRBC 07/06/2023 0.0  0.0 - 0.2 % Final   Neutrophils Relative % 07/06/2023 51  % Final   Neutro Abs 07/06/2023 3.5  1.7 - 7.7 K/uL Final   Lymphocytes Relative 07/06/2023 31  % Final   Lymphs Abs 07/06/2023 2.1  0.7 - 4.0 K/uL Final   Monocytes Relative 07/06/2023 15  % Final   Monocytes Absolute 07/06/2023 1.0  0.1 - 1.0 K/uL Final   Eosinophils Relative 07/06/2023 2  % Final   Eosinophils Absolute 07/06/2023 0.1  0.0 - 0.5 K/uL Final   Basophils Relative 07/06/2023 1  % Final   Basophils Absolute 07/06/2023 0.1  0.0 - 0.1 K/uL Final   Immature Granulocytes 07/06/2023 0  % Final   Abs Immature Granulocytes 07/06/2023 0.02  0.00 - 0.07 K/uL Final   Performed at [facility], [address] [place], [place] [address]   Sodium 07/06/2023 140  135 - 145 mmol/L Final   Potassium 07/06/2023 4.3  3.5 - 5.1 mmol/L Final   Chloride 07/06/2023 102  98 - 111 mmol/L Final   CO2 07/06/2023 30  22 - 32 mmol/L Final   Glucose, Bld 07/06/2023 68 (L)  70 - 99 mg/dL  Final   Glucose reference range applies only to samples taken after fasting for at least 8 hours.   BUN 07/06/2023 18  6 - 20 mg/dL Final   Creatinine, Ser 07/06/2023 0.86  0.61 - 1.24 mg/dL Final   Calcium 04/13/7251 9.7  8.9 - 10.3 mg/dL Final   Total Protein 66/44/0347 6.9  6.5 - 8.1 g/dL Final   Albumin 42/59/5638 3.7  3.5 - 5.0 g/dL Final   AST 75/64/3329 37  15 - 41 U/L Final   ALT 07/06/2023 46 (H)  0 - 44 U/L Final   Alkaline Phosphatase 07/06/2023 52  38 - 126 U/L Final   Total Bilirubin 07/06/2023 0.6  0.0 - 1.2 mg/dL Final   GFR, Estimated 07/06/2023 >60  >60 mL/min Final   Comment: (NOTE) Calculated using the CKD-EPI Creatinine Equation (2021)    Anion gap 07/06/2023 8  5 - 15 Final   Performed at [facility], [address], [place], [place] [address]   Alcohol, Ethyl (B) 07/06/2023 <10  <10 mg/dL Final   Comment: (NOTE) Lowest detectable limit for serum alcohol is 10 mg/dL.  For medical purposes only. Performed at [facility], [address] [place], [place] [address]    Cholesterol 07/06/2023 169  0 - 200 mg/dL Final   Triglycerides 30/16/0109 42  <150 mg/dL Final   HDL 32/35/5732 56  >40 mg/dL Final   Total CHOL/HDL Ratio 07/06/2023 3.0  RATIO Final   VLDL 07/06/2023 8  0 - 40 mg/dL Final   LDL Cholesterol 07/06/2023 105 (H)  0 - 99 mg/dL Final   Comment:        Total Cholesterol/HDL:CHD Risk Coronary Heart Disease Risk Table                     Men   Women  1/2 Average Risk   3.4   3.3  Average Risk       5.0   4.4  2 X Average Risk   9.6   7.1  3 X Average Risk  23.4   11.0  Use the calculated Patient Ratio above and the CHD Risk Table to determine the patient's CHD Risk.        ATP III CLASSIFICATION (LDL):  <100     mg/dL   Optimal  829-562  mg/dL   Near or Above                    Optimal  130-159  mg/dL   Borderline  130-865  mg/dL   High  >784     mg/dL   Very High Performed at [facility], [address],  [place], [place] [address]    TSH 07/06/2023 0.431  0.350 - 4.500 uIU/mL Final   Comment: Performed by a 3rd Generation assay with a functional sensitivity of <=0.01 uIU/mL. Performed at [facility], [address] [place], [place] [address]    Color, Urine 07/06/2023 YELLOW  YELLOW Final   APPearance 07/06/2023 HAZY (A)  CLEAR Final   Specific Gravity, Urine 07/06/2023 1.025  1.005 - 1.030 Final   pH 07/06/2023 5.0  5.0 - 8.0 Final   Glucose, UA 07/06/2023 50 (A)  NEGATIVE mg/dL Final   Hgb urine dipstick 07/06/2023 NEGATIVE  NEGATIVE Final   Bilirubin Urine 07/06/2023 NEGATIVE  NEGATIVE Final   Ketones, ur 07/06/2023 NEGATIVE  NEGATIVE mg/dL Final   Protein, ur 32/44/0102 NEGATIVE  NEGATIVE mg/dL Final   Nitrite 72/53/6644 NEGATIVE  NEGATIVE Final   Leukocytes,Ua 07/06/2023 NEGATIVE  NEGATIVE Final   RBC / HPF 07/06/2023 0-5  0 - 5 RBC/hpf Final   WBC, UA 07/06/2023 0-5  0 - 5 WBC/hpf Final   Bacteria, UA 07/06/2023 RARE (A)  NONE SEEN Final   Squamous Epithelial / HPF 07/06/2023 0-5  0 - 5 /HPF Final   Mucus 07/06/2023 PRESENT   Final   Ca Oxalate Crys, UA 07/06/2023 PRESENT   Final   Performed at [facility], [address] [place], [place] [address]   POC Amphetamine UR 07/06/2023 Positive (A)  NONE DETECTED (Cut Off Level 1000 ng/mL) Final   POC Secobarbital (BAR) 07/06/2023 None Detected  NONE DETECTED (Cut Off Level 300 ng/mL) Final   POC Buprenorphine (BUP) 07/06/2023 None Detected  NONE DETECTED (Cut Off Level 10 ng/mL) Final   POC Oxazepam (BZO) 07/06/2023 None Detected  NONE DETECTED (Cut Off Level 300 ng/mL) Final   POC Cocaine UR 07/06/2023 None Detected  NONE DETECTED (Cut Off Level 300 ng/mL) Final   POC Methamphetamine UR 07/06/2023 Positive (A)  NONE DETECTED (Cut Off Level 1000 ng/mL) Final   POC Morphine 07/06/2023 None Detected  NONE DETECTED (Cut Off Level 300 ng/mL) Final   POC Methadone UR 07/06/2023 None Detected  NONE DETECTED (Cut Off Level 300  ng/mL) Final   POC Oxycodone UR 07/06/2023 Positive (A)  NONE DETECTED (Cut Off Level 100 ng/mL) Final   POC Marijuana UR 07/06/2023 None Detected  NONE DETECTED (Cut Off Level 50 ng/mL) Final  Admission on 07/06/2023, Discharged on 07/06/2023  Component Date Value Ref Range Status   Sodium 07/06/2023 137  135 - 145 mmol/L Final   Potassium 07/06/2023 4.0  3.5 - 5.1 mmol/L Final   Chloride 07/06/2023 99  98 - 111 mmol/L Final   CO2 07/06/2023 30  22 - 32 mmol/L Final   Glucose, Bld 07/06/2023 92  70 - 99 mg/dL Final   Glucose reference range applies only to samples taken after fasting for at least 8 hours.   BUN 07/06/2023 20  6 - 20 mg/dL Final   Creatinine, Ser 07/06/2023 0.82  0.61 - 1.24 mg/dL Final   Calcium 16/01/9603 9.8  8.9 - 10.3 mg/dL Final   Total Protein 54/12/8117 7.1  6.5 - 8.1 g/dL Final   Albumin 14/78/2956 3.7  3.5 - 5.0 g/dL Final   AST 21/30/8657 34  15 - 41 U/L Final   ALT 07/06/2023 41  0 - 44 U/L Final   Alkaline Phosphatase 07/06/2023 51  38 - 126 U/L Final   Total Bilirubin 07/06/2023 0.8  0.0 - 1.2 mg/dL Final   GFR, Estimated 07/06/2023 >60  >60 mL/min Final   Comment: (NOTE) Calculated using the CKD-EPI Creatinine Equation (2021)    Anion gap 07/06/2023 8  5 - 15 Final   Performed at [facility], [address], [place], [place] [address]   Alcohol, Ethyl (B) 07/06/2023 <10  <10 mg/dL Final   Comment: (NOTE) Lowest detectable limit for serum alcohol is 10 mg/dL.  For medical purposes only. Performed at [facility], [address], [place], [place] [address]    WBC 07/06/2023 8.7  4.0 - 10.5 K/uL Final   RBC 07/06/2023 4.63  4.22 - 5.81 MIL/uL Final   Hemoglobin 07/06/2023 13.7  13.0 - 17.0 g/dL Final   HCT 41/32/4401 41.2  39.0 - 52.0 % Final   MCV 07/06/2023 89.0  80.0 - 100.0 fL Final   MCH 07/06/2023 29.6  26.0 - 34.0 pg Final   MCHC 07/06/2023 33.3  30.0 - 36.0 g/dL Final   RDW 02/72/5366 12.0  11.5 - 15.5 % Final   Platelets  07/06/2023 325  150 - 400 K/uL Final   nRBC 07/06/2023 0.0  0.0 - 0.2 % Final   Performed at [facility], [address] [place], [place] [address]   Opiates 07/06/2023 NONE DETECTED  NONE DETECTED Final   Cocaine 07/06/2023 NONE DETECTED  NONE DETECTED Final   Benzodiazepines 07/06/2023 NONE DETECTED  NONE DETECTED Final   Amphetamines 07/06/2023 POSITIVE (A)  NONE DETECTED Final   Comment: (NOTE) Trazodone is metabolized in vivo to several metabolites, including pharmacologically active m-CPP, which is excreted in the urine. Immunoassay screens for amphetamines and MDMA have potential cross-reactivity with these compounds and may provide false positive  results.     Tetrahydrocannabinol 07/06/2023 NONE DETECTED  NONE DETECTED Final   Barbiturates 07/06/2023 NONE DETECTED  NONE DETECTED Final   Comment: (NOTE) DRUG SCREEN FOR MEDICAL PURPOSES ONLY.  IF CONFIRMATION IS NEEDED FOR ANY PURPOSE, NOTIFY LAB WITHIN 5 DAYS.  LOWEST DETECTABLE LIMITS FOR URINE DRUG SCREEN Drug Class                     Cutoff (ng/mL) Amphetamine and metabolites    1000 Barbiturate and metabolites    200 Benzodiazepine                 200 Opiates and metabolites        300 Cocaine and metabolites        300 THC                            50 Performed at [facility], [address] [place], [place] [address]   Admission on 06/29/2023, Discharged on 07/04/2023  Component Date Value Ref Range Status   Specimen Description 06/29/2023 BLOOD RIGHT HAND   Final   Special Requests 06/29/2023 BOTTLES  DRAWN AEROBIC AND ANAEROBIC Blood Culture adequate volume   Final   Culture 06/29/2023    Final                   Value:NO GROWTH 5 DAYS Performed at [facility], [address] [place], [place] [address]    Report Status 06/29/2023 07/04/2023 FINAL   Final   Specimen Description 06/29/2023 BLOOD LEFT ANTECUBITAL   Final   Special Requests 06/29/2023 BOTTLES DRAWN AEROBIC AND ANAEROBIC  Blood Culture adequate volume   Final   Culture 06/29/2023    Final                   Value:NO GROWTH 5 DAYS Performed at [facility], [address] [place], [place] [address]    Report Status 06/29/2023 07/05/2023 FINAL   Final   WBC 06/29/2023 10.1  4.0 - 10.5 K/uL Final   RBC 06/29/2023 4.62  4.22 - 5.81 MIL/uL Final   Hemoglobin 06/29/2023 14.2  13.0 - 17.0 g/dL Final   HCT 52/84/1324 40.3  39.0 - 52.0 % Final   MCV 06/29/2023 87.2  80.0 - 100.0 fL Final   MCH 06/29/2023 30.7  26.0 - 34.0 pg Final   MCHC 06/29/2023 35.2  30.0 - 36.0 g/dL Final   RDW 40/01/2724 12.3  11.5 - 15.5 % Final   Platelets 06/29/2023 PLATELET CLUMPS NOTED ON SMEAR, UNABLE TO ESTIMATE  150 - 400 K/uL Final   nRBC 06/29/2023 0.0  0.0 - 0.2 % Final   Neutrophils Relative % 06/29/2023 73  % Final   Neutro Abs 06/29/2023 7.4  1.7 - 7.7 K/uL Final   Lymphocytes Relative 06/29/2023 14  % Final   Lymphs Abs 06/29/2023 1.4  0.7 - 4.0 K/uL Final   Monocytes Relative 06/29/2023 12  % Final   Monocytes Absolute 06/29/2023 1.2 (H)  0.1 - 1.0 K/uL Final   Eosinophils Relative 06/29/2023 1  % Final   Eosinophils Absolute 06/29/2023 0.1  0.0 - 0.5 K/uL Final   Basophils Relative 06/29/2023 0  % Final   Basophils Absolute 06/29/2023 0.0  0.0 - 0.1 K/uL Final   WBC Morphology 06/29/2023 MORPHOLOGY UNREMARKABLE   Final   RBC Morphology 06/29/2023 MORPHOLOGY UNREMARKABLE   Final   Smear Review 06/29/2023 PLATELET CLUMPS NOTED ON SMEAR, UNABLE TO ESTIMATE   Final   Immature Granulocytes 06/29/2023 0  % Final   Abs Immature Granulocytes 06/29/2023 0.04  0.00 - 0.07 K/uL Final   Performed at [facility], [address], [place], [place] [address]   Sodium 06/29/2023 137  135 - 145 mmol/L Final   Potassium 06/29/2023 3.8  3.5 - 5.1 mmol/L Final   Chloride 06/29/2023 103  98 - 111 mmol/L Final   CO2 06/29/2023 25  22 - 32 mmol/L Final   Glucose, Bld 06/29/2023 110 (H)  70 - 99 mg/dL Final   Glucose reference  range applies only to samples taken after fasting for at least 8 hours.   BUN 06/29/2023 9  6 - 20 mg/dL Final   Creatinine, Ser 06/29/2023 0.73  0.61 - 1.24 mg/dL Final   Calcium 03/47/4259 9.5  8.9 - 10.3 mg/dL Final   Total Protein 56/38/7564 7.0  6.5 - 8.1 g/dL Final   Albumin 33/29/5188 3.7  3.5 - 5.0 g/dL Final   AST 41/66/0630 21  15 - 41 U/L Final   ALT 06/29/2023 20  0 - 44 U/L Final  Alkaline Phosphatase 06/29/2023 56  38 - 126 U/L Final   Total Bilirubin 06/29/2023 1.0  0.0 - 1.2 mg/dL Final   GFR, Estimated 06/29/2023 >60  >60 mL/min Final   Comment: (NOTE) Calculated using the CKD-EPI Creatinine Equation (2021)    Anion gap 06/29/2023 9  5 - 15 Final   Performed at [facility], [address], [place], [place] [address]   Lactic Acid, Venous 06/30/2023 1.0  0.5 - 1.9 mmol/L Final   Performed at [facility], [address] [place], [place] [address]   Lactic Acid, Venous 06/30/2023 1.3  0.5 - 1.9 mmol/L Final   Performed at [facility], [address] [place], [place] [address]   WBC 06/30/2023 10.7 (H)  4.0 - 10.5 K/uL Final   RBC 06/30/2023 4.35  4.22 - 5.81 MIL/uL Final   Hemoglobin 06/30/2023 13.0  13.0 - 17.0 g/dL Final   HCT 13/11/6576 38.4 (L)  39.0 - 52.0 % Final   MCV 06/30/2023 88.3  80.0 - 100.0 fL Final   MCH 06/30/2023 29.9  26.0 - 34.0 pg Final   MCHC 06/30/2023 33.9  30.0 - 36.0 g/dL Final   RDW 46/96/2952 12.1  11.5 - 15.5 % Final   Platelets 06/30/2023 PLATELET CLUMPS NOTED ON SMEAR, UNABLE TO ESTIMATE  150 - 400 K/uL Final   Comment: SPECIMEN CHECKED FOR CLOTS Immature Platelet Fraction may be clinically indicated, consider ordering this additional test [id number] REPEATED TO VERIFY    nRBC 06/30/2023 0.0  0.0 - 0.2 % Final   Performed at [facility], [address] [place], [place] [address]   Sodium 06/30/2023 133 (L)  135 - 145 mmol/L Final   Potassium 06/30/2023 4.4  3.5 - 5.1 mmol/L Final   Chloride 06/30/2023  104  98 - 111 mmol/L Final   CO2 06/30/2023 24  22 - 32 mmol/L Final   Glucose, Bld 06/30/2023 208 (H)  70 - 99 mg/dL Final   Glucose reference range applies only to samples taken after fasting for at least 8 hours.   BUN 06/30/2023 10  6 - 20 mg/dL Final   Creatinine, Ser 06/30/2023 0.86  0.61 - 1.24 mg/dL Final   Calcium 53/66/4403 8.9  8.9 - 10.3 mg/dL Final   GFR, Estimated 06/30/2023 >60  >60 mL/min Final   Comment: (NOTE) Calculated using the CKD-EPI Creatinine Equation (2021)    Anion gap 06/30/2023 5  5 - 15 Final   Performed at [facility], [address], [place], [place] [address]   Magnesium 06/30/2023 2.0  1.7 - 2.4 mg/dL Final   Performed at [facility], [address] [place], [place] [address]   Phosphorus 06/30/2023 2.2 (L)  2.5 - 4.6 mg/dL Final   Performed at [facility], [address] [place], [place] [address]   Specimen Description 06/30/2023 OTHER   Final   Special Requests 06/30/2023 Finger R   Final   Gram Stain 06/30/2023    Final                   Value:RARE WBC PRESENT, PREDOMINANTLY PMN FEW GRAM POSITIVE COCCI    Culture 06/30/2023    Final                   Value:ABUNDANT METHICILLIN RESISTANT STAPHYLOCOCCUS AUREUS NO ANAEROBES ISOLATED Performed at [facility], [address], [place], [place] [address]  Report Status 06/30/2023 07/05/2023 FINAL   Final   Organism ID, Bacteria 06/30/2023 METHICILLIN RESISTANT STAPHYLOCOCCUS AUREUS   Final   Magnesium 07/01/2023 1.9  1.7 - 2.4 mg/dL Final   Performed at [facility], [address], [place], [place] [address]   WBC 07/01/2023 11.2 (H)  4.0 - 10.5 K/uL Final   RBC 07/01/2023 4.48  4.22 - 5.81 MIL/uL Final   Hemoglobin 07/01/2023 13.6  13.0 - 17.0 g/dL Final   HCT 60/45/4098 39.9  39.0 - 52.0 % Final   MCV 07/01/2023 89.1  80.0 - 100.0 fL Final   MCH 07/01/2023 30.4  26.0 - 34.0 pg Final   MCHC 07/01/2023 34.1  30.0 - 36.0 g/dL Final   RDW 11/91/4782 12.2  11.5  - 15.5 % Final   Platelets 07/01/2023 164  150 - 400 K/uL Final   nRBC 07/01/2023 0.0  0.0 - 0.2 % Final   Neutrophils Relative % 07/01/2023 59  % Final   Neutro Abs 07/01/2023 6.7  1.7 - 7.7 K/uL Final   Lymphocytes Relative 07/01/2023 28  % Final   Lymphs Abs 07/01/2023 3.1  0.7 - 4.0 K/uL Final   Monocytes Relative 07/01/2023 11  % Final   Monocytes Absolute 07/01/2023 1.2 (H)  0.1 - 1.0 K/uL Final   Eosinophils Relative 07/01/2023 2  % Final   Eosinophils Absolute 07/01/2023 0.2  0.0 - 0.5 K/uL Final   Basophils Relative 07/01/2023 0  % Final   Basophils Absolute 07/01/2023 0.1  0.0 - 0.1 K/uL Final   Immature Granulocytes 07/01/2023 0  % Final   Abs Immature Granulocytes 07/01/2023 0.03  0.00 - 0.07 K/uL Final   Performed at [facility], [address], [place], [place] [address]   Sodium 07/01/2023 137  135 - 145 mmol/L Final   Potassium 07/01/2023 3.9  3.5 - 5.1 mmol/L Final   Chloride 07/01/2023 101  98 - 111 mmol/L Final   CO2 07/01/2023 29  22 - 32 mmol/L Final   Glucose, Bld 07/01/2023 103 (H)  70 - 99 mg/dL Final   Glucose reference range applies only to samples taken after fasting for at least 8 hours.   BUN 07/01/2023 10  6 - 20 mg/dL Final   Creatinine, Ser 07/01/2023 0.79  0.61 - 1.24 mg/dL Final   Calcium 30/86/5784 8.9  8.9 - 10.3 mg/dL Final   Phosphorus 69/62/9528 3.4  2.5 - 4.6 mg/dL Final   Albumin 41/32/4401 3.0 (L)  3.5 - 5.0 g/dL Final   GFR, Estimated 07/01/2023 >60  >60 mL/min Final   Comment: (NOTE) Calculated using the CKD-EPI Creatinine Equation (2021)    Anion gap 07/01/2023 7  5 - 15 Final   Performed at [facility], [address], [place], [place] [address]   WBC 07/02/2023 7.3  4.0 - 10.5 K/uL Final   RBC 07/02/2023 4.75  4.22 - 5.81 MIL/uL Final   Hemoglobin 07/02/2023 14.3  13.0 - 17.0 g/dL Final   HCT 36/64/4034 41.6  39.0 - 52.0 % Final   MCV 07/02/2023 87.6  80.0 - 100.0 fL Final   MCH 07/02/2023 30.1  26.0 - 34.0 pg Final    MCHC 07/02/2023 34.4  30.0 - 36.0 g/dL Final   RDW 74/25/9563 12.0  11.5 - 15.5 % Final   Platelets 07/02/2023 185  150 - 400 K/uL Final   nRBC 07/02/2023 0.0  0.0 - 0.2 % Final   Performed at [facility]  [facility], [address], [place], [place] [address]   hep A Total Ab 07/02/2023 NON REACTIVE  NON REACTIVE Final   Performed at [facility], [address], [place], [place] [address]   Hepatitis B Surface Ag 07/02/2023 NON REACTIVE  NON REACTIVE Final   Performed at [facility], [address] [place], [place] [address]   Hep B S AB Quant (Post) 07/02/2023 3.6 (L)  Immunity>10 mIU/mL Final   Comment: (NOTE)  Status of Immunity                     Anti-HBs Level  ------------------                     -------------- Inconsistent with Immunity                  0.0 - 10.0 Consistent with Immunity                         >10.0 Performed At: [facility] [address] [place], [place] [id number] Jolene Schimke MD BM:[id number]    HCV Ab 07/02/2023 Non Reactive  Non Reactive Final   Comment: (NOTE) Performed At: [facility] [address] [place], [place] [id number] Jolene Schimke MD IH:[id number]    HIV Screen 4th Generation wRfx 07/02/2023 Non Reactive  Non Reactive Final   Performed at [facility], [address], [place], [place] [address]   Sed Rate 07/02/2023 18 (H)  0 - 16 mm/hr Final   Performed at [facility], [address], [place], [place] [address]   CRP 07/02/2023 3.0 (H)  <1.0 mg/dL Final   Performed at [facility], [address] [place], [place] [address]   MRSA by PCR Next Gen 07/03/2023 DETECTED (A)  NOT DETECTED Final   Comment: RESULT CALLED TO, READ BACK BY AND VERIFIED WITH: RN Alvina Filbert [phone] @ [id number] FH (NOTE) The GeneXpert MRSA Assay (FDA approved for NASAL specimens only), is one component of a comprehensive MRSA colonization surveillance program. It is not intended to diagnose MRSA infection nor to  guide or monitor treatment for MRSA infections. Test performance is not FDA approved in patients less than 39 years old. Performed at [facility], [address] [place], [place] [address]    HCV Interp 1: 07/02/2023 Comment   Final   Comment: (NOTE) Not infected with HCV unless early or acute infection is suspected (which may be delayed in an immunocompromised individual), or other evidence exists to indicate HCV infection. Performed At: [facility] [address] [place], [place] [id number] Jolene Schimke MD YH:[id number]     Allergies: Patient has no known allergies.  Medications:  Facility Ordered Medications  Medication   acetaminophen (TYLENOL) tablet 650 mg   alum & mag hydroxide-simeth (MAALOX/MYLANTA) 200-200-20 MG/5ML suspension 30 mL   magnesium hydroxide (MILK OF MAGNESIA) suspension 30 mL   haloperidol (HALDOL) tablet 5 mg   And   diphenhydrAMINE (BENADRYL) capsule 50 mg   haloperidol lactate (HALDOL) injection 5 mg   And   diphenhydrAMINE (BENADRYL) injection 50 mg   And   LORazepam (ATIVAN) injection 2 mg   haloperidol lactate (HALDOL) injection 10 mg   And   diphenhydrAMINE (BENADRYL) injection 50 mg   And   LORazepam (ATIVAN) injection 2 mg   traZODone (DESYREL) tablet 50 mg   hydrOXYzine (ATARAX) tablet 25  mg   dicyclomine (BENTYL) tablet 20 mg   hydrOXYzine (ATARAX) tablet 25 mg   loperamide (IMODIUM) capsule 2-4 mg   methocarbamol (ROBAXIN) tablet 500 mg   naproxen (NAPROSYN) tablet 500 mg   ondansetron (ZOFRAN-ODT) disintegrating tablet 4 mg   cloNIDine (CATAPRES) tablet 0.1 mg   Followed by   Melene Muller ON 07/10/2023] cloNIDine (CATAPRES) tablet 0.1 mg   Followed by   Melene Muller ON 07/13/2023] cloNIDine (CATAPRES) tablet 0.1 mg   PTA Medications  Medication Sig   doxycycline (VIBRA-TABS) 100 MG tablet Take 1 tablet (100 mg total) by mouth 2 (two) times daily.   ascorbic acid (VITAMIN C) 1000 MG tablet Take 1 tablet (1,000 mg total) by  mouth daily. (Patient not taking: Reported on 07/06/2023)   polyethylene glycol (MIRALAX / GLYCOLAX) 17 g packet Take 17 g by mouth daily.   nicotine (NICODERM CQ - DOSED IN MG/24 HOURS) 21 mg/24hr patch Place 1 patch (21 mg total) onto the skin daily.   traZODone (DESYREL) 50 MG tablet Take 1 tablet (50 mg total) by mouth at bedtime as needed for sleep.      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    Recommendations  Based on my evaluation the patient does not appear to have an emergency medical condition. Patient will be admitted to [facility] continuous observation for crisis management  safety, and stabilization.   Jasper Riling, NP 07/08/23  3:41 AM

## 2023-07-08 NOTE — ED Notes (Signed)
 Patient arrived to the unit ambulating without assistance. He is calm and cooperative. He currently denies SI and reports HI towards no one in particular and denies anxiety. The patient reports depression 5 and reports sleeping good at times. Staff will continue to monitor safety per protocol and for changes in her condition.

## 2023-07-08 NOTE — ED Notes (Signed)
 Dressing changed wound cleaned and wrapped with bandage.  Wound appears to be healing well without drainage or signs of infection.  Patient is calm and in good behavioral control.  No distress at this time.  He continues to report thoughts of wanting to harm 2 individuals in the community for unknown reasons.  Clonidine held. Will monitor.

## 2023-07-08 NOTE — ED Notes (Signed)
 Pt transferred from observation unit to [facility] requesting detox from meth and Fentanyl. Pt reports last use 2 days ago. Pt denies SI but express anger toward his mother and brother. Pt use excessive profanity talking about family. Pt states, "I I dream about shooting them mother fuckers". Pt states, "I got to get this anger under control before I get myself in trouble. These mother fuckers out here think I'm stupid but I'm really smart. As long as I keep God in my corner, I can't go wrong". Pt rambles on in speech with slurred wording. Pt speaks about punching his family in the face. Not easily redirected but able to redirect. Pt homeless. Denies AVH. Cooperative. Skin assessment completed. Oriented to unit. Meal and drink offered. Pt verbally contract for safety. Will monitor for safety.

## 2023-07-08 NOTE — ED Notes (Signed)
 Patient in the bedroom calm and sleeping.NAD, Respirations even and unlabored. Will monitor closely for safety.

## 2023-07-08 NOTE — ED Provider Notes (Signed)
 FBC/OBS ASAP Discharge Summary  Date and Time: 07/08/2023 4:30 PM  Name: [name]  MRN:  [id number]   Discharge Diagnoses:  Final diagnoses:  Polysubstance abuse [facility])  Homelessness    Subjective: "I do drugs and am homicidal"  Stay Summary: [name] is a 28 y/o male with a history of polysubstance abuse and homelessness presented to [facility] as a walk in voluntarily and unaccompanied and is wanting substance abuse treatment for methamphetamine and fentanyl abuse. Patient presented with thoughts of homicide toward 2 of his friends with whom he had an argument.   On 07/06/2023, patient was evaluated here at [facility]  when he presented with chief complaint of substance abuse and homelessness. He has been diagnosed with anxiety in the past with a past medical diagnosis of Osteomyelitis.   Today upon Assessment:    [name], 28 y.o., male who is evaluated  face to face by this provide  and chart reviewed.  Upon assessment,  [name] reports that he has been using methamphetamine and fentanyl and wants to detox. He also reports endorsing homicidal ideations toward 2 of his friends secondary to an argument he had with them prior to coming here.  Patient reports that he used meth and fentanyl yesterday. He admits to using alcohol occasionally.     During this evaluation [name] is seen in bed, awake.  He is alert, oriented x 4, but restless and irritable. His mood is flat with congruent affect.  He has normal speech and frequently reports that he will hurt his friends.  Objectively there is no evidence of psychosis/mania or delusional thinking. He is aware of his substance abuse issue and reports that he is willing to work on it.  Patient is able to converse coherently, goal directed thoughts, but distractible, preoccupied by his HI toward his friends.  He denies SI/AVH.   Patient reports he is currently homeless and wants to work on himself  in terms of substance abuse and  his goal is to "get myself together and end homelessness". Patient denies current medical acuity. He denies chest pain. Denies respiratory distress. Denies hx of seizures/syncope. Denies nausea/vomiting. He reports that his appetite is good and sleep is improving.   Patient expresses motivation for treatment to address his substance abuse problem. We will transfer him to [facility] for treatment, admitted by DR Renaldo Fiddler, MD.   Total Time spent with patient: 30 minutes  Past Psychiatric History: Anxiety, Substance abuse Past Medical History: Osteomyelitis Family History: NA Family Psychiatric History: NA Social History: Homeless, Unemployed Tobacco Cessation:  N/A, patient does not currently use tobacco products  Current Medications:  Current Facility-Administered Medications  Medication Dose Route Frequency Provider Last Rate Last Admin   acetaminophen (TYLENOL) tablet 650 mg  650 mg Oral Q6H PRN Bobbitt, Shalon E, NP   650 mg at 07/08/23 0401   alum & mag hydroxide-simeth (MAALOX/MYLANTA) 200-200-20 MG/5ML suspension 30 mL  30 mL Oral Q4H PRN Bobbitt, Shalon E, NP       cloNIDine (CATAPRES) tablet 0.1 mg  0.1 mg Oral QID Bobbitt, Shalon E, NP       Followed by   Melene Muller ON 07/10/2023] cloNIDine (CATAPRES) tablet 0.1 mg  0.1 mg Oral BH-qamhs Bobbitt, Shalon E, NP       Followed by   Melene Muller ON 07/13/2023] cloNIDine (CATAPRES) tablet 0.1 mg  0.1 mg Oral QAC breakfast Bobbitt, Shalon E, NP       dicyclomine (BENTYL)  tablet 20 mg  20 mg Oral Q6H PRN Bobbitt, Shalon E, NP       haloperidol (HALDOL) tablet 5 mg  5 mg Oral TID PRN Bobbitt, Shalon E, NP       And   diphenhydrAMINE (BENADRYL) capsule 50 mg  50 mg Oral TID PRN Bobbitt, Shalon E, NP       haloperidol lactate (HALDOL) injection 5 mg  5 mg Intramuscular TID PRN Bobbitt, Shalon E, NP       And   diphenhydrAMINE (BENADRYL) injection 50 mg  50 mg Intramuscular TID PRN Bobbitt, Shalon E, NP       And   LORazepam (ATIVAN) injection 2 mg  2 mg  Intramuscular TID PRN Bobbitt, Shalon E, NP       haloperidol lactate (HALDOL) injection 10 mg  10 mg Intramuscular TID PRN Bobbitt, Shalon E, NP       And   diphenhydrAMINE (BENADRYL) injection 50 mg  50 mg Intramuscular TID PRN Bobbitt, Shalon E, NP       And   LORazepam (ATIVAN) injection 2 mg  2 mg Intramuscular TID PRN Bobbitt, Shalon E, NP       doxycycline (VIBRA-TABS) tablet 100 mg  100 mg Oral BID Bobbitt, Shalon E, NP   100 mg at 07/08/23 0858   hydrOXYzine (ATARAX) tablet 25 mg  25 mg Oral TID PRN Bobbitt, Shalon E, NP       hydrOXYzine (ATARAX) tablet 25 mg  25 mg Oral Q6H PRN Bobbitt, Shalon E, NP       loperamide (IMODIUM) capsule 2-4 mg  2-4 mg Oral PRN Bobbitt, Shalon E, NP       magnesium hydroxide (MILK OF MAGNESIA) suspension 30 mL  30 mL Oral Daily PRN Bobbitt, Shalon E, NP       methocarbamol (ROBAXIN) tablet 500 mg  500 mg Oral Q8H PRN Bobbitt, Shalon E, NP       naproxen (NAPROSYN) tablet 500 mg  500 mg Oral BID PRN Bobbitt, Shalon E, NP       ondansetron (ZOFRAN-ODT) disintegrating tablet 4 mg  4 mg Oral Q6H PRN Bobbitt, Shalon E, NP       traZODone (DESYREL) tablet 50 mg  50 mg Oral QHS PRN Bobbitt, Shalon E, NP       Current Outpatient Medications  Medication Sig Dispense Refill   ascorbic acid (VITAMIN C) 1000 MG tablet Take 1 tablet (1,000 mg total) by mouth daily. (Patient not taking: Reported on 07/06/2023) 30 tablet 1   doxycycline (VIBRA-TABS) 100 MG tablet Take 1 tablet (100 mg total) by mouth 2 (two) times daily. 84 tablet 0   nicotine (NICODERM CQ - DOSED IN MG/24 HOURS) 21 mg/24hr patch Place 1 patch (21 mg total) onto the skin daily. 28 patch 0   polyethylene glycol (MIRALAX / GLYCOLAX) 17 g packet Take 17 g by mouth daily. 14 each 0   traZODone (DESYREL) 50 MG tablet Take 1 tablet (50 mg total) by mouth at bedtime as needed for sleep. 14 tablet 0    PTA Medications:  Facility Ordered Medications  Medication   acetaminophen (TYLENOL) tablet 650 mg    alum & mag hydroxide-simeth (MAALOX/MYLANTA) 200-200-20 MG/5ML suspension 30 mL   magnesium hydroxide (MILK OF MAGNESIA) suspension 30 mL   haloperidol (HALDOL) tablet 5 mg   And   diphenhydrAMINE (BENADRYL) capsule 50 mg   haloperidol lactate (HALDOL) injection 5 mg   And   diphenhydrAMINE (BENADRYL) injection 50  mg   And   LORazepam (ATIVAN) injection 2 mg   haloperidol lactate (HALDOL) injection 10 mg   And   diphenhydrAMINE (BENADRYL) injection 50 mg   And   LORazepam (ATIVAN) injection 2 mg   traZODone (DESYREL) tablet 50 mg   hydrOXYzine (ATARAX) tablet 25 mg   dicyclomine (BENTYL) tablet 20 mg   hydrOXYzine (ATARAX) tablet 25 mg   loperamide (IMODIUM) capsule 2-4 mg   methocarbamol (ROBAXIN) tablet 500 mg   naproxen (NAPROSYN) tablet 500 mg   ondansetron (ZOFRAN-ODT) disintegrating tablet 4 mg   cloNIDine (CATAPRES) tablet 0.1 mg   Followed by   Melene Muller ON 07/10/2023] cloNIDine (CATAPRES) tablet 0.1 mg   Followed by   Melene Muller ON 07/13/2023] cloNIDine (CATAPRES) tablet 0.1 mg   doxycycline (VIBRA-TABS) tablet 100 mg   PTA Medications  Medication Sig   doxycycline (VIBRA-TABS) 100 MG tablet Take 1 tablet (100 mg total) by mouth 2 (two) times daily.   ascorbic acid (VITAMIN C) 1000 MG tablet Take 1 tablet (1,000 mg total) by mouth daily. (Patient not taking: Reported on 07/06/2023)   polyethylene glycol (MIRALAX / GLYCOLAX) 17 g packet Take 17 g by mouth daily.   nicotine (NICODERM CQ - DOSED IN MG/24 HOURS) 21 mg/24hr patch Place 1 patch (21 mg total) onto the skin daily.   traZODone (DESYREL) 50 MG tablet Take 1 tablet (50 mg total) by mouth at bedtime as needed for sleep.        No data to display          Flowsheet Row ED from 07/08/2023 in [facility] ED from 07/07/2023 in [facility] at [facility] ED from 07/06/2023 in [facility]  C-SSRS RISK CATEGORY No Risk No Risk No Risk        Musculoskeletal  Strength & Muscle Tone: within normal limits Gait & Station: normal Patient leans: N/A  Psychiatric Specialty Exam  Presentation  General Appearance:  Disheveled  Eye Contact: Poor  Speech: Pressured  Speech Volume: Normal  Handedness: Right   Mood and Affect  Mood: Anxious; Depressed; Irritable  Affect: Restricted; Depressed   Thought Process  Thought Processes: Coherent  Descriptions of Associations:Circumstantial  Orientation:Full (Time, Place and Person)  Thought Content:WDL  Diagnosis of Schizophrenia or Schizoaffective disorder in past: No    Hallucinations:Hallucinations: None  Ideas of Reference:None  Suicidal Thoughts:Suicidal Thoughts: No  Homicidal Thoughts:Homicidal Thoughts: Yes, Passive (Feels like hurting his 2 friends in the community)   Sensorium  Memory: Immediate Fair; Recent Fair; Remote Fair  Judgment: Fair  Insight: Fair   Executive Functions  Concentration: Fair  Attention Span: Fair  Recall: Fair  Fund of Knowledge: Fair  Language: Fair   Psychomotor Activity  Psychomotor Activity: Psychomotor Activity: Restlessness   Assets  Assets: Communication Skills; Desire for Improvement; Physical Health   Sleep  Sleep: Sleep: Good Number of Hours of Sleep: 9   Nutritional Assessment (For OBS and FBC admissions only) Has the patient had a weight loss or gain of 10 pounds or more in the last 3 months?: No Has the patient had a decrease in food intake/or appetite?: No Does the patient have dental problems?: No Does the patient have eating habits or behaviors that may be indicators of an eating disorder including binging or inducing vomiting?: No Has the patient recently lost weight without trying?: 0 Has the patient been eating poorly because of a decreased appetite?: 0 Malnutrition Screening Tool  Score: 0    Physical Exam  Physical Exam Vitals and nursing note reviewed.   Constitutional:      Appearance: Normal appearance.  HENT:     Head: Normocephalic and atraumatic.     Right Ear: Tympanic membrane normal.     Left Ear: Tympanic membrane normal.     Mouth/Throat:     Mouth: Mucous membranes are moist.  Eyes:     Extraocular Movements: Extraocular movements intact.     Pupils: Pupils are equal, round, and reactive to light.  Cardiovascular:     Rate and Rhythm: Normal rate.     Pulses: Normal pulses.  Pulmonary:     Effort: Pulmonary effort is normal.  Musculoskeletal:        General: Normal range of motion.     Cervical back: Normal range of motion and neck supple.  Neurological:     General: No focal deficit present.     Mental Status: He is alert and oriented to person, place, and time.    Review of Systems  Constitutional: Negative.   HENT: Negative.    Eyes: Negative.   Respiratory: Negative.    Cardiovascular: Negative.   Gastrointestinal: Negative.   Genitourinary: Negative.   Musculoskeletal: Negative.   Skin: Negative.   Neurological: Negative.   Endo/Heme/Allergies: Negative.   Psychiatric/Behavioral:  Positive for depression and substance abuse. The patient is nervous/anxious.    Blood pressure 101/65, pulse (!) 56, temperature 97.8 F (36.6 C), temperature source Oral, resp. rate 16, SpO2 99%. There is no height or weight on file to calculate BMI.  Demographic Factors:  Male, Caucasian, Low socioeconomic status, and Unemployed  Loss Factors: Financial problems/change in socioeconomic status  Historical Factors: NA  Risk Reduction Factors:   NA  Continued Clinical Symptoms:  Severe Anxiety and/or Agitation Depression:   Impulsivity  Cognitive Features That Contribute To Risk:  None    Suicide Risk:  Mild:  Suicidal ideation of limited frequency, intensity, duration, and specificity.  There are no identifiable plans, no associated intent, mild dysphoria and related symptoms, good self-control (both objective  and subjective assessment), few other risk factors, and identifiable protective factors, including available and accessible social support.  Plan Of Care/Follow-up recommendations:  Activity:  As tolerated Diet:  Regular  Disposition:  Discharge from Observation unit. To be admitted to [facility] under Dr Renaldo Fiddler, MD  Olin Pia, NP 07/08/2023, 4:30 PM

## 2023-07-08 NOTE — ED Notes (Signed)
 Patient in the dayroom calm and composed watching TV with other patients. Denies SI/HI/AVH. Respirations even and unlabored. Will monitor for safety.

## 2023-07-08 NOTE — ED Notes (Signed)
 Pt sleeping@this  time breathing even and unlabored will continue to monitor for safety

## 2023-07-09 DIAGNOSIS — F1920 Other psychoactive substance dependence, uncomplicated: Secondary | ICD-10-CM | POA: Diagnosis present

## 2023-07-09 DIAGNOSIS — F1910 Other psychoactive substance abuse, uncomplicated: Secondary | ICD-10-CM | POA: Diagnosis not present

## 2023-07-09 DIAGNOSIS — F419 Anxiety disorder, unspecified: Secondary | ICD-10-CM | POA: Diagnosis not present

## 2023-07-09 MED ORDER — NICOTINE POLACRILEX 2 MG MT GUM
2.0000 mg | CHEWING_GUM | OROMUCOSAL | Status: DC | PRN
  Administered 2023-07-09 – 2023-07-10 (×4): 2 mg via ORAL
  Filled 2023-07-09 (×4): qty 1
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
## 2023-07-09 NOTE — ED Notes (Signed)
 Provider notified of flagged BP 109/55. No new orders given. Will continue to monitor for safety.

## 2023-07-09 NOTE — ED Notes (Signed)
 Pt sitting in dayroom watching television and interacting with peers. No acute distress noted. No concerns voiced. Informed pt to notify staff with any needs or assistance. Pt verbalized understanding and agreement. Will continue to monitor for safety.

## 2023-07-09 NOTE — ED Notes (Signed)
 Patient is sleeping. Respirations equal and unlabored, skin warm and dry. No change in assessment or acuity. Routine safety checks conducted according to facility protocol. Will continue to monitor for safety.

## 2023-07-09 NOTE — ED Notes (Signed)
 Pt sleeping in no acute distress. RR even and unlabored. Environment secured. Will continue to monitor for safety.

## 2023-07-09 NOTE — ED Notes (Signed)
 Pt reports wound dressing is off and requested nurse to clean and dress wound up. Writer cleaned and dressed wounds up.

## 2023-07-09 NOTE — ED Notes (Signed)
 Pt is in the dayroom watching TV with peers. Pt denies SI/HI/AVH. Pt has no further complain.No acute distress noted. Will continue to monitor for safety and provide support.

## 2023-07-09 NOTE — ED Provider Notes (Addendum)
 Facility Based Crisis Admission H&P  Date: 07/09/23 Patient Name: [name] MRN: [id number] Chief Complaint: " I am here to get help quit drugs"  Diagnoses:  Final diagnoses:  Polysubstance abuse    HPI:  Bernie Ransford. 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 Patient presented with thoughts of homicide toward 2 of his friends with whom he had an argument.  Chart reviewed, case discussed with staff, patient seen today during rounds.  Patient reports that he is here to get help quit drug use.  He states that he has been homeless for past 2 years.  He reports he stays on street and " in tents all over the place".  Patient was provided with support and reassurance.  Patient reports that he is motivated to stay sober.  He was encouraged to discuss rehab options with social worker on the unit.  Patient reports that he has worked off and on, his last work was at [employer] 3 weeks ago.  He reports he is estranged from his family because of his drug use.  He denies any PTSD symptoms or major traumas growing up.  Although he said that it was" poor growing up".  Patient denies past history of suicide attempt.  At the time of admission patient reported  homicidal ideation towards his friend, today he denies any intention to harm his friends or himself.  Patient reports that his drug of choice is methamphetamine.  Patient's UDS on 07/06/2023 was positive for amphetamine, methamphetamine, and oxycodone.  UDS repeated on 3/29 is positive for oxycodone.  At this point in time, patient denies any withdrawal symptoms from opiates.  He was encouraged to keep himself hydrated and report any withdrawals to the nurse on the unit.  He agrees to do so.  Today patient denies recent use of fentanyl.  Patient denies manic or psychotic symptoms.   Patient was offered trial of antidepressant which he declined.  He reports that at time he feels stressed and anxious about his drug use, he said" I am not depressed, I just need some help quitting drugs". Patient was encouraged to attend groups and participate in milieu.  PHQ 2-9:  Flowsheet Row ED from 07/08/2023 in [facility]  Thoughts that you would be better off dead, or of hurting yourself in some way Not at all  PHQ-9 Total Score 3       Flowsheet Row ED from 07/08/2023 in [facility] Most recent reading at 07/08/2023  5:58 PM ED from 07/08/2023 in [facility] Most recent reading at 07/08/2023  4:44 AM ED from 07/07/2023 in [facility] Emergency Department at [facility] Most recent reading at 07/07/2023  9:20 PM  C-SSRS RISK CATEGORY No Risk No Risk No Risk         Total Time spent with patient: 1 hour  Musculoskeletal  Strength & Muscle Tone: within normal limits Gait & Station: normal Patient leans: N/A  Psychiatric Specialty Exam  Presentation General Appearance:  Disheveled  Eye Contact: Fair  Speech: Normal rate  Speech Volume: Normal  Handedness: Right   Mood and Affect  Mood: Anxious  Affect: Restless   Thought Process  Thought Processes: Coherent  Descriptions of Associations: Linear and goal-directed  Orientation:Full (Time, Place and Person)  Thought Content:WDL  Diagnosis of Schizophrenia or Schizoaffective disorder in past: No   Hallucinations:Hallucinations: None  Ideas of Reference:None  Suicidal Thoughts:Suicidal Thoughts: No  Homicidal Thoughts: Patient denies homicidal thoughts today   Sensorium  Memory: Immediate Fair; Recent Fair; Remote Fair  Judgment: Limited  Insight: Fair   Chartered certified accountant: Fair  Attention Span: Fair  Recall: [employer] of  Knowledge: Fair  Language: Fair   Psychomotor Activity  Psychomotor Activity: Increased   Assets  Assets: Manufacturing systems engineer; Desire for Improvement; Physical Health   Sleep  Sleep: Fair  Nutritional Assessment (For OBS and FBC admissions only) Has the patient had a weight loss or gain of 10 pounds or more in the last 3 months?: No Has the patient had a decrease in food intake/or appetite?: No Does the patient have dental problems?: No Does the patient have eating habits or behaviors that may be indicators of an eating disorder including binging or inducing vomiting?: No Has the patient recently lost weight without trying?: 0 Has the patient been eating poorly because of a decreased appetite?: 0 Malnutrition Screening Tool Score: 0    Physical Exam Constitutional:      Appearance: Normal appearance.  HENT:     Head: Normocephalic and atraumatic.     Nose: No congestion.  Eyes:     Pupils: Pupils are equal, round, and reactive to light.  Cardiovascular:     Rate and Rhythm: Regular rhythm.  Pulmonary:     Effort: Pulmonary effort is normal.  Musculoskeletal:     Comments: Patient has bandage dressing on his right thumb.  Patient said that the wound was looked at in the ER yesterday and bandaged.  He doesn't want to open the dressing today, Patient was encouraged to get it checked by nursing staff tomorrow  Skin:    General: Skin is warm.  Neurological:     General: No focal deficit present.     Mental Status: He is alert and oriented to person, place, and time.    Review of Systems  Constitutional:  Negative for chills and fever.  HENT:  Negative for hearing loss and sore throat.   Eyes:  Negative for blurred vision and double vision.  Respiratory:  Negative for cough and shortness of breath.   Cardiovascular:  Negative for chest pain and palpitations.  Gastrointestinal:  Negative for diarrhea, nausea and vomiting.  Neurological:  Negative for dizziness  and speech change.  Psychiatric/Behavioral:  Positive for substance abuse. Negative for suicidal ideas. The patient is nervous/anxious.     Blood pressure (!) 93/52, pulse 75, temperature 97.9 F (36.6 C), temperature source Oral, resp. rate 16, SpO2 98%. There is no height or weight on file to calculate BMI.  Past Psychiatric History: Patient reports history of depression, anxiety, and substance abuse.  Is the patient at risk to self? No  Has the patient been a risk to self in the past 6 months? No .    Has the patient been a risk to self within the distant past? No   Is the patient a risk to others? NO Has the patient been a risk to others in the past 6 months? No   Has the patient been a risk to others within the distant past? No   Past Medical History: Asthma and acid reflux Family History: None reported by patient Social History: Patient is single and unemployed.  He reports he is homeless  Last Labs:  Admission on 07/08/2023, Discharged on 07/08/2023  Component Date Value Ref Range Status   Alcohol, Ethyl (B) 07/08/2023 <10  <10 mg/dL Final   Comment: (NOTE) Lowest detectable limit for serum alcohol is 10 mg/dL.  For medical purposes only. Performed at [facility], [address] [place], [place] [address]    POC Amphetamine UR 07/08/2023 None Detected  NONE DETECTED (Cut Off Level 1000 ng/mL) Final   POC Secobarbital (BAR) 07/08/2023 None Detected  NONE DETECTED (Cut Off Level 300 ng/mL) Final   POC Buprenorphine (BUP) 07/08/2023 None Detected  NONE DETECTED (Cut Off Level 10 ng/mL) Final   POC Oxazepam (BZO) 07/08/2023 None Detected  NONE DETECTED (Cut Off Level 300 ng/mL) Final   POC Cocaine UR 07/08/2023 None Detected  NONE DETECTED (Cut Off Level 300 ng/mL) Final   POC Methamphetamine UR 07/08/2023 None Detected  NONE DETECTED (Cut Off Level 1000 ng/mL) Final   POC Morphine 07/08/2023 None Detected  NONE DETECTED (Cut Off Level 300 ng/mL) Final   POC Methadone  UR 07/08/2023 None Detected  NONE DETECTED (Cut Off Level 300 ng/mL) Final   POC Oxycodone UR 07/08/2023 Positive (A)  NONE DETECTED (Cut Off Level 100 ng/mL) Final   POC Marijuana UR 07/08/2023 None Detected  NONE DETECTED (Cut Off Level 50 ng/mL) Final  Admission on 07/06/2023, Discharged on 07/07/2023  Component Date Value Ref Range Status   WBC 07/06/2023 6.9  4.0 - 10.5 K/uL Final   RBC 07/06/2023 4.61  4.22 - 5.81 MIL/uL Final   Hemoglobin 07/06/2023 14.1  13.0 - 17.0 g/dL Final   HCT 52/84/1324 40.6  39.0 - 52.0 % Final   MCV 07/06/2023 88.1  80.0 - 100.0 fL Final   MCH 07/06/2023 30.6  26.0 - 34.0 pg Final   MCHC 07/06/2023 34.7  30.0 - 36.0 g/dL Final   RDW 40/01/2724 12.0  11.5 - 15.5 % Final   Platelets 07/06/2023 289  150 - 400 K/uL Final   nRBC 07/06/2023 0.0  0.0 - 0.2 % Final   Neutrophils Relative % 07/06/2023 51  % Final   Neutro Abs 07/06/2023 3.5  1.7 - 7.7 K/uL Final   Lymphocytes Relative 07/06/2023 31  % Final   Lymphs Abs 07/06/2023 2.1  0.7 - 4.0 K/uL Final   Monocytes Relative 07/06/2023 15  % Final   Monocytes Absolute 07/06/2023 1.0  0.1 - 1.0 K/uL Final   Eosinophils Relative 07/06/2023 2  % Final   Eosinophils Absolute 07/06/2023 0.1  0.0 - 0.5 K/uL Final   Basophils Relative 07/06/2023 1  % Final   Basophils Absolute 07/06/2023 0.1  0.0 - 0.1 K/uL Final   Immature Granulocytes 07/06/2023 0  % Final   Abs Immature Granulocytes 07/06/2023 0.02  0.00 - 0.07 K/uL Final   Performed at [facility], [address], [place], [place] [address]   Sodium 07/06/2023 140  135 - 145 mmol/L Final   Potassium 07/06/2023 4.3  3.5 - 5.1 mmol/L Final   Chloride 07/06/2023 102  98 - 111 mmol/L Final   CO2 07/06/2023 30  22 - 32 mmol/L Final   Glucose, Bld 07/06/2023 68 (L)  70 - 99 mg/dL Final   Glucose reference range applies only to samples taken after fasting for at least 8 hours.   BUN 07/06/2023 18  6 - 20 mg/dL Final   Creatinine, Ser 07/06/2023 0.86  0.61 -  1.24 mg/dL Final   Calcium 03/47/4259 9.7  8.9 - 10.3 mg/dL Final   Total Protein 56/38/7564  6.9  6.5 - 8.1 g/dL Final   Albumin 54/12/8117 3.7  3.5 - 5.0 g/dL Final   AST 14/78/2956 37  15 - 41 U/L Final   ALT 07/06/2023 46 (H)  0 - 44 U/L Final   Alkaline Phosphatase 07/06/2023 52  38 - 126 U/L Final   Total Bilirubin 07/06/2023 0.6  0.0 - 1.2 mg/dL Final   GFR, Estimated 07/06/2023 >60  >60 mL/min Final   Comment: (NOTE) Calculated using the CKD-EPI Creatinine Equation (2021)    Anion gap 07/06/2023 8  5 - 15 Final   Performed at [facility], [address], [place], [place] [address]   Alcohol, Ethyl (B) 07/06/2023 <10  <10 mg/dL Final   Comment: (NOTE) Lowest detectable limit for serum alcohol is 10 mg/dL.  For medical purposes only. Performed at [facility], [address] [place], [place] [address]    Cholesterol 07/06/2023 169  0 - 200 mg/dL Final   Triglycerides 69/62/9528 42  <150 mg/dL Final   HDL 41/32/4401 56  >40 mg/dL Final   Total CHOL/HDL Ratio 07/06/2023 3.0  RATIO Final   VLDL 07/06/2023 8  0 - 40 mg/dL Final   LDL Cholesterol 07/06/2023 105 (H)  0 - 99 mg/dL Final   Comment:        Total Cholesterol/HDL:CHD Risk Coronary Heart Disease Risk Table                     Men   Women  1/2 Average Risk   3.4   3.3  Average Risk       5.0   4.4  2 X Average Risk   9.6   7.1  3 X Average Risk  23.4   11.0        Use the calculated Patient Ratio above and the CHD Risk Table to determine the patient's CHD Risk.        ATP III CLASSIFICATION (LDL):  <100     mg/dL   Optimal  027-253  mg/dL   Near or Above                    Optimal  130-159  mg/dL   Borderline  664-403  mg/dL   High  >474     mg/dL   Very High Performed at [facility], [address], [place], [place] [address]    TSH 07/06/2023 0.431  0.350 - 4.500 uIU/mL Final   Comment: Performed by a 3rd Generation assay with a functional sensitivity of <=0.01 uIU/mL. Performed at  [facility], [address], [place], [place] [address]    Color, Urine 07/06/2023 YELLOW  YELLOW Final   APPearance 07/06/2023 HAZY (A)  CLEAR Final   Specific Gravity, Urine 07/06/2023 1.025  1.005 - 1.030 Final   pH 07/06/2023 5.0  5.0 - 8.0 Final   Glucose, UA 07/06/2023 50 (A)  NEGATIVE mg/dL Final   Hgb urine dipstick 07/06/2023 NEGATIVE  NEGATIVE Final   Bilirubin Urine 07/06/2023 NEGATIVE  NEGATIVE Final   Ketones, ur 07/06/2023 NEGATIVE  NEGATIVE mg/dL Final   Protein, ur 43/32/9518 NEGATIVE  NEGATIVE mg/dL Final   Nitrite 84/16/6063 NEGATIVE  NEGATIVE Final   Leukocytes,Ua 07/06/2023 NEGATIVE  NEGATIVE Final   RBC / HPF 07/06/2023 0-5  0 - 5 RBC/hpf Final   WBC, UA 07/06/2023 0-5  0 - 5 WBC/hpf Final   Bacteria, UA 07/06/2023 RARE (A)  NONE  SEEN Final   Squamous Epithelial / HPF 07/06/2023 0-5  0 - 5 /HPF Final   Mucus 07/06/2023 PRESENT   Final   Ca Oxalate Crys, UA 07/06/2023 PRESENT   Final   Performed at [facility], [address], [place], [place] [address]   POC Amphetamine UR 07/06/2023 Positive (A)  NONE DETECTED (Cut Off Level 1000 ng/mL) Final   POC Secobarbital (BAR) 07/06/2023 None Detected  NONE DETECTED (Cut Off Level 300 ng/mL) Final   POC Buprenorphine (BUP) 07/06/2023 None Detected  NONE DETECTED (Cut Off Level 10 ng/mL) Final   POC Oxazepam (BZO) 07/06/2023 None Detected  NONE DETECTED (Cut Off Level 300 ng/mL) Final   POC Cocaine UR 07/06/2023 None Detected  NONE DETECTED (Cut Off Level 300 ng/mL) Final   POC Methamphetamine UR 07/06/2023 Positive (A)  NONE DETECTED (Cut Off Level 1000 ng/mL) Final   POC Morphine 07/06/2023 None Detected  NONE DETECTED (Cut Off Level 300 ng/mL) Final   POC Methadone UR 07/06/2023 None Detected  NONE DETECTED (Cut Off Level 300 ng/mL) Final   POC Oxycodone UR 07/06/2023 Positive (A)  NONE DETECTED (Cut Off Level 100 ng/mL) Final   POC Marijuana UR 07/06/2023 None Detected  NONE DETECTED (Cut Off Level 50 ng/mL)  Final  Admission on 07/06/2023, Discharged on 07/06/2023  Component Date Value Ref Range Status   Sodium 07/06/2023 137  135 - 145 mmol/L Final   Potassium 07/06/2023 4.0  3.5 - 5.1 mmol/L Final   Chloride 07/06/2023 99  98 - 111 mmol/L Final   CO2 07/06/2023 30  22 - 32 mmol/L Final   Glucose, Bld 07/06/2023 92  70 - 99 mg/dL Final   Glucose reference range applies only to samples taken after fasting for at least 8 hours.   BUN 07/06/2023 20  6 - 20 mg/dL Final   Creatinine, Ser 07/06/2023 0.82  0.61 - 1.24 mg/dL Final   Calcium 60/45/4098 9.8  8.9 - 10.3 mg/dL Final   Total Protein 11/91/4782 7.1  6.5 - 8.1 g/dL Final   Albumin 95/62/1308 3.7  3.5 - 5.0 g/dL Final   AST 65/78/4696 34  15 - 41 U/L Final   ALT 07/06/2023 41  0 - 44 U/L Final   Alkaline Phosphatase 07/06/2023 51  38 - 126 U/L Final   Total Bilirubin 07/06/2023 0.8  0.0 - 1.2 mg/dL Final   GFR, Estimated 07/06/2023 >60  >60 mL/min Final   Comment: (NOTE) Calculated using the CKD-EPI Creatinine Equation (2021)    Anion gap 07/06/2023 8  5 - 15 Final   Performed at [facility], [address] [place], [place] [address]   Alcohol, Ethyl (B) 07/06/2023 <10  <10 mg/dL Final   Comment: (NOTE) Lowest detectable limit for serum alcohol is 10 mg/dL.  For medical purposes only. Performed at [facility], [address] [place], [place] [address]    WBC 07/06/2023 8.7  4.0 - 10.5 K/uL Final   RBC 07/06/2023 4.63  4.22 - 5.81 MIL/uL Final   Hemoglobin 07/06/2023 13.7  13.0 - 17.0 g/dL Final   HCT 40/01/2724 41.2  39.0 - 52.0 % Final   MCV 07/06/2023 89.0  80.0 - 100.0 fL Final   MCH 07/06/2023 29.6  26.0 - 34.0 pg Final   MCHC 07/06/2023 33.3  30.0 - 36.0 g/dL Final   RDW 36/64/4034 12.0  11.5 - 15.5 % Final   Platelets 07/06/2023 325  150 - 400 K/uL Final  nRBC 07/06/2023 0.0  0.0 - 0.2 % Final   Performed at [facility], [address], [place], [place] [address]   Opiates 07/06/2023 NONE  DETECTED  NONE DETECTED Final   Cocaine 07/06/2023 NONE DETECTED  NONE DETECTED Final   Benzodiazepines 07/06/2023 NONE DETECTED  NONE DETECTED Final   Amphetamines 07/06/2023 POSITIVE (A)  NONE DETECTED Final   Comment: (NOTE) Trazodone is metabolized in vivo to several metabolites, including pharmacologically active m-CPP, which is excreted in the urine. Immunoassay screens for amphetamines and MDMA have potential cross-reactivity with these compounds and may provide false positive  results.     Tetrahydrocannabinol 07/06/2023 NONE DETECTED  NONE DETECTED Final   Barbiturates 07/06/2023 NONE DETECTED  NONE DETECTED Final   Comment: (NOTE) DRUG SCREEN FOR MEDICAL PURPOSES ONLY.  IF CONFIRMATION IS NEEDED FOR ANY PURPOSE, NOTIFY LAB WITHIN 5 DAYS.  LOWEST DETECTABLE LIMITS FOR URINE DRUG SCREEN Drug Class                     Cutoff (ng/mL) Amphetamine and metabolites    1000 Barbiturate and metabolites    200 Benzodiazepine                 200 Opiates and metabolites        300 Cocaine and metabolites        300 THC                            50 Performed at [facility], [address], [place], [place] [address]   Admission on 06/29/2023, Discharged on 07/04/2023  Component Date Value Ref Range Status   Specimen Description 06/29/2023 BLOOD RIGHT HAND   Final   Special Requests 06/29/2023 BOTTLES DRAWN AEROBIC AND ANAEROBIC Blood Culture adequate volume   Final   Culture 06/29/2023    Final                   Value:NO GROWTH 5 DAYS Performed at [facility], [address] [place], [place] [address]    Report Status 06/29/2023 07/04/2023 FINAL   Final   Specimen Description 06/29/2023 BLOOD LEFT ANTECUBITAL   Final   Special Requests 06/29/2023 BOTTLES DRAWN AEROBIC AND ANAEROBIC Blood Culture adequate volume   Final   Culture 06/29/2023    Final                   Value:NO GROWTH 5 DAYS Performed at [facility], [address], [place], [place]  [address]    Report Status 06/29/2023 07/05/2023 FINAL   Final   WBC 06/29/2023 10.1  4.0 - 10.5 K/uL Final   RBC 06/29/2023 4.62  4.22 - 5.81 MIL/uL Final   Hemoglobin 06/29/2023 14.2  13.0 - 17.0 g/dL Final   HCT 29/56/2130 40.3  39.0 - 52.0 % Final   MCV 06/29/2023 87.2  80.0 - 100.0 fL Final   MCH 06/29/2023 30.7  26.0 - 34.0 pg Final   MCHC 06/29/2023 35.2  30.0 - 36.0 g/dL Final   RDW 86/57/8469 12.3  11.5 - 15.5 % Final   Platelets 06/29/2023 PLATELET CLUMPS NOTED ON SMEAR, UNABLE TO ESTIMATE  150 - 400 K/uL Final   nRBC 06/29/2023 0.0  0.0 - 0.2 % Final   Neutrophils Relative % 06/29/2023 73  % Final   Neutro Abs 06/29/2023 7.4  1.7 - 7.7 K/uL Final  Lymphocytes Relative 06/29/2023 14  % Final   Lymphs Abs 06/29/2023 1.4  0.7 - 4.0 K/uL Final   Monocytes Relative 06/29/2023 12  % Final   Monocytes Absolute 06/29/2023 1.2 (H)  0.1 - 1.0 K/uL Final   Eosinophils Relative 06/29/2023 1  % Final   Eosinophils Absolute 06/29/2023 0.1  0.0 - 0.5 K/uL Final   Basophils Relative 06/29/2023 0  % Final   Basophils Absolute 06/29/2023 0.0  0.0 - 0.1 K/uL Final   WBC Morphology 06/29/2023 MORPHOLOGY UNREMARKABLE   Final   RBC Morphology 06/29/2023 MORPHOLOGY UNREMARKABLE   Final   Smear Review 06/29/2023 PLATELET CLUMPS NOTED ON SMEAR, UNABLE TO ESTIMATE   Final   Immature Granulocytes 06/29/2023 0  % Final   Abs Immature Granulocytes 06/29/2023 0.04  0.00 - 0.07 K/uL Final   Performed at [facility], [address], [place], [place] [address]   Sodium 06/29/2023 137  135 - 145 mmol/L Final   Potassium 06/29/2023 3.8  3.5 - 5.1 mmol/L Final   Chloride 06/29/2023 103  98 - 111 mmol/L Final   CO2 06/29/2023 25  22 - 32 mmol/L Final   Glucose, Bld 06/29/2023 110 (H)  70 - 99 mg/dL Final   Glucose reference range applies only to samples taken after fasting for at least 8 hours.   BUN 06/29/2023 9  6 - 20 mg/dL Final   Creatinine, Ser 06/29/2023 0.73  0.61 - 1.24 mg/dL Final   Calcium  36/64/4034 9.5  8.9 - 10.3 mg/dL Final   Total Protein 74/25/9563 7.0  6.5 - 8.1 g/dL Final   Albumin 87/56/4332 3.7  3.5 - 5.0 g/dL Final   AST 95/18/8416 21  15 - 41 U/L Final   ALT 06/29/2023 20  0 - 44 U/L Final   Alkaline Phosphatase 06/29/2023 56  38 - 126 U/L Final   Total Bilirubin 06/29/2023 1.0  0.0 - 1.2 mg/dL Final   GFR, Estimated 06/29/2023 >60  >60 mL/min Final   Comment: (NOTE) Calculated using the CKD-EPI Creatinine Equation (2021)    Anion gap 06/29/2023 9  5 - 15 Final   Performed at [facility], [address] [place], [place] [address]   Lactic Acid, Venous 06/30/2023 1.0  0.5 - 1.9 mmol/L Final   Performed at [facility], [address] [place], [place] [address]   Lactic Acid, Venous 06/30/2023 1.3  0.5 - 1.9 mmol/L Final   Performed at [facility], [address], [place], [place] [address]   WBC 06/30/2023 10.7 (H)  4.0 - 10.5 K/uL Final   RBC 06/30/2023 4.35  4.22 - 5.81 MIL/uL Final   Hemoglobin 06/30/2023 13.0  13.0 - 17.0 g/dL Final   HCT 57/32/2025 38.4 (L)  39.0 - 52.0 % Final   MCV 06/30/2023 88.3  80.0 - 100.0 fL Final   MCH 06/30/2023 29.9  26.0 - 34.0 pg Final   MCHC 06/30/2023 33.9  30.0 - 36.0 g/dL Final   RDW 42/70/6237 12.1  11.5 - 15.5 % Final   Platelets 06/30/2023 PLATELET CLUMPS NOTED ON SMEAR, UNABLE TO ESTIMATE  150 - 400 K/uL Final   Comment: SPECIMEN CHECKED FOR CLOTS Immature Platelet Fraction may be clinically indicated, consider ordering this additional test [id number] REPEATED TO VERIFY    nRBC 06/30/2023 0.0  0.0 - 0.2 % Final   Performed at [facility], [address] [place], [place] [address]   Sodium 06/30/2023  133 (L)  135 - 145 mmol/L Final   Potassium 06/30/2023 4.4  3.5 - 5.1 mmol/L Final   Chloride 06/30/2023 104  98 - 111 mmol/L Final   CO2 06/30/2023 24  22 - 32 mmol/L Final   Glucose, Bld 06/30/2023 208 (H)  70 - 99 mg/dL Final   Glucose reference range applies only to samples taken  after fasting for at least 8 hours.   BUN 06/30/2023 10  6 - 20 mg/dL Final   Creatinine, Ser 06/30/2023 0.86  0.61 - 1.24 mg/dL Final   Calcium 82/95/6213 8.9  8.9 - 10.3 mg/dL Final   GFR, Estimated 06/30/2023 >60  >60 mL/min Final   Comment: (NOTE) Calculated using the CKD-EPI Creatinine Equation (2021)    Anion gap 06/30/2023 5  5 - 15 Final   Performed at [facility], [address] [place], [place] [address]   Magnesium 06/30/2023 2.0  1.7 - 2.4 mg/dL Final   Performed at [facility], [address], [place], [place] [address]   Phosphorus 06/30/2023 2.2 (L)  2.5 - 4.6 mg/dL Final   Performed at [facility], [address], [place], [place] [address]   Specimen Description 06/30/2023 OTHER   Final   Special Requests 06/30/2023 Finger R   Final   Gram Stain 06/30/2023    Final                   Value:RARE WBC PRESENT, PREDOMINANTLY PMN FEW GRAM POSITIVE COCCI    Culture 06/30/2023    Final                   Value:ABUNDANT METHICILLIN RESISTANT STAPHYLOCOCCUS AUREUS NO ANAEROBES ISOLATED Performed at [facility], [address], [place], [place] [address]    Report Status 06/30/2023 07/05/2023 FINAL   Final   Organism ID, Bacteria 06/30/2023 METHICILLIN RESISTANT STAPHYLOCOCCUS AUREUS   Final   Magnesium 07/01/2023 1.9  1.7 - 2.4 mg/dL Final   Performed at [facility], [address], [place], [place] [address]   WBC 07/01/2023 11.2 (H)  4.0 - 10.5 K/uL Final   RBC 07/01/2023 4.48  4.22 - 5.81 MIL/uL Final   Hemoglobin 07/01/2023 13.6  13.0 - 17.0 g/dL Final   HCT 72/53/6644 39.9  39.0 - 52.0 % Final   MCV 07/01/2023 89.1  80.0 - 100.0 fL Final   MCH 07/01/2023 30.4  26.0 - 34.0 pg Final   MCHC 07/01/2023 34.1  30.0 - 36.0 g/dL Final   RDW 03/47/4259 12.2  11.5 - 15.5 % Final   Platelets 07/01/2023 164  150 - 400 K/uL Final   nRBC 07/01/2023 0.0  0.0 - 0.2 % Final   Neutrophils Relative % 07/01/2023 59  % Final   Neutro Abs 07/01/2023  6.7  1.7 - 7.7 K/uL Final   Lymphocytes Relative 07/01/2023 28  % Final   Lymphs Abs 07/01/2023 3.1  0.7 - 4.0 K/uL Final   Monocytes Relative 07/01/2023 11  % Final   Monocytes Absolute 07/01/2023 1.2 (H)  0.1 - 1.0 K/uL Final   Eosinophils Relative 07/01/2023 2  % Final   Eosinophils Absolute 07/01/2023 0.2  0.0 - 0.5 K/uL Final   Basophils Relative 07/01/2023 0  % Final   Basophils Absolute 07/01/2023 0.1  0.0 - 0.1 K/uL Final   Immature Granulocytes 07/01/2023 0  % Final   Abs Immature Granulocytes 07/01/2023 0.03  0.00 - 0.07 K/uL  Final   Performed at [facility], [address] [place], [place] [address]   Sodium 07/01/2023 137  135 - 145 mmol/L Final   Potassium 07/01/2023 3.9  3.5 - 5.1 mmol/L Final   Chloride 07/01/2023 101  98 - 111 mmol/L Final   CO2 07/01/2023 29  22 - 32 mmol/L Final   Glucose, Bld 07/01/2023 103 (H)  70 - 99 mg/dL Final   Glucose reference range applies only to samples taken after fasting for at least 8 hours.   BUN 07/01/2023 10  6 - 20 mg/dL Final   Creatinine, Ser 07/01/2023 0.79  0.61 - 1.24 mg/dL Final   Calcium 30/86/5784 8.9  8.9 - 10.3 mg/dL Final   Phosphorus 69/62/9528 3.4  2.5 - 4.6 mg/dL Final   Albumin 41/32/4401 3.0 (L)  3.5 - 5.0 g/dL Final   GFR, Estimated 07/01/2023 >60  >60 mL/min Final   Comment: (NOTE) Calculated using the CKD-EPI Creatinine Equation (2021)    Anion gap 07/01/2023 7  5 - 15 Final   Performed at [facility], [address], [place], [place] [address]   WBC 07/02/2023 7.3  4.0 - 10.5 K/uL Final   RBC 07/02/2023 4.75  4.22 - 5.81 MIL/uL Final   Hemoglobin 07/02/2023 14.3  13.0 - 17.0 g/dL Final   HCT 36/64/4034 41.6  39.0 - 52.0 % Final   MCV 07/02/2023 87.6  80.0 - 100.0 fL Final   MCH 07/02/2023 30.1  26.0 - 34.0 pg Final   MCHC 07/02/2023 34.4  30.0 - 36.0 g/dL Final   RDW 74/25/9563 12.0  11.5 - 15.5 % Final   Platelets 07/02/2023 185  150 - 400 K/uL Final   nRBC 07/02/2023 0.0  0.0 - 0.2 % Final    Performed at [facility], [address], [place], [place] [address]   hep A Total Ab 07/02/2023 NON REACTIVE  NON REACTIVE Final   Performed at [facility], [address], [place], [place] [address]   Hepatitis B Surface Ag 07/02/2023 NON REACTIVE  NON REACTIVE Final   Performed at [facility], [address], [place], [place] [address]   Hep B S AB Quant (Post) 07/02/2023 3.6 (L)  Immunity>10 mIU/mL Final   Comment: (NOTE)  Status of Immunity                     Anti-HBs Level  ------------------                     -------------- Inconsistent with Immunity                  0.0 - 10.0 Consistent with Immunity                         >10.0 Performed At: [facility] [address] [place], [place] [id number] Jolene Schimke MD FU:[id number]    HCV Ab 07/02/2023 Non Reactive  Non Reactive Final   Comment: (NOTE) Performed At: [facility] [address] [place], [place] [id number] Jolene Schimke MD BJ:[id number]    HIV Screen 4th Generation wRfx 07/02/2023 Non Reactive  Non Reactive Final   Performed at [facility], [address], [place], [place] [address]   Sed Rate 07/02/2023 18 (H)  0 - 16 mm/hr Final   Performed at [facility], [address], [place], [place] [address]  CRP 07/02/2023 3.0 (H)  <1.0 mg/dL Final   Performed at [facility], [address] [place], [place] [address]   MRSA by PCR Next Gen 07/03/2023 DETECTED (A)  NOT DETECTED Final   Comment: RESULT CALLED TO, READ BACK BY AND VERIFIED WITH: RN Alvina Filbert [phone] @ [phone] FH (NOTE) The GeneXpert MRSA Assay (FDA approved for NASAL specimens only), is one component of a comprehensive MRSA colonization surveillance program. It is not intended to diagnose MRSA infection nor to guide or monitor treatment for MRSA infections. Test performance is not FDA approved in patients less than 41 years old. Performed at [facility], [address],  [place], [place] [address]    HCV Interp 1: 07/02/2023 Comment   Final   Comment: (NOTE) Not infected with HCV unless early or acute infection is suspected (which may be delayed in an immunocompromised individual), or other evidence exists to indicate HCV infection. Performed At: [facility] [address] [place], [place] [id number] Jolene Schimke MD GL:[id number]     Allergies: Patient has no known allergies.  Medications:  Facility Ordered Medications  Medication   acetaminophen (TYLENOL) tablet 650 mg   alum & mag hydroxide-simeth (MAALOX/MYLANTA) 200-200-20 MG/5ML suspension 30 mL   magnesium hydroxide (MILK OF MAGNESIA) suspension 30 mL   haloperidol (HALDOL) tablet 5 mg   And   diphenhydrAMINE (BENADRYL) capsule 50 mg   haloperidol lactate (HALDOL) injection 5 mg   And   diphenhydrAMINE (BENADRYL) injection 50 mg   And   LORazepam (ATIVAN) injection 2 mg   hydrOXYzine (ATARAX) tablet 25 mg   traZODone (DESYREL) tablet 50 mg   dicyclomine (BENTYL) tablet 20 mg   loperamide (IMODIUM) capsule 2-4 mg   methocarbamol (ROBAXIN) tablet 500 mg   naproxen (NAPROSYN) tablet 500 mg   ondansetron (ZOFRAN-ODT) disintegrating tablet 4 mg   thiamine (VITAMIN B1) tablet 100 mg   multivitamin with minerals tablet 1 tablet   LORazepam (ATIVAN) tablet 1 mg   doxycycline (VIBRA-TABS) tablet 100 mg   PTA Medications  Medication Sig   doxycycline (VIBRA-TABS) 100 MG tablet Take 1 tablet (100 mg total) by mouth 2 (two) times daily.   ascorbic acid (VITAMIN C) 1000 MG tablet Take 1 tablet (1,000 mg total) by mouth daily. (Patient not taking: Reported on 07/06/2023)   polyethylene glycol (MIRALAX / GLYCOLAX) 17 g packet Take 17 g by mouth daily.   nicotine (NICODERM CQ - DOSED IN MG/24 HOURS) 21 mg/24hr patch Place 1 patch (21 mg total) onto the skin daily.   traZODone (DESYREL) 50 MG tablet Take 1 tablet (50 mg total) by mouth at bedtime as needed for sleep.    Long Term Goals:  Improvement in symptoms so as ready for discharge  Short Term Goals: Patient will verbalize feelings in meetings with treatment team members., Patient will attend at least of 50% of the groups daily., Pt will complete the PHQ9 on admission, day 3 and discharge., Patient will participate in completing the [place] Suicide Severity Rating Scale, Patient will score a low risk of violence for 24 hours prior to discharge, and Patient will take medications as prescribed daily.  Medical Decision Making   Ramon Dredge. 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 Patient presented with thoughts of homicide toward 2 of his friends with  whom he had an argument.    Treatment plan:  Patient is admitted to [facility] for detox and referral to a rehab facility   Social worker consulted to help with a safe discharge plan and discussed options of rehab with the patient Patient was offered a trial of antidepressant and anxiolytic, at this point in time patient had declined psychotropic medicines Patient was encouraged to participate in milieu and work on his sobriety For now continue on symptomatic treatment with as needed medicine  Per ER note" he had incision and drainage of the right thumb due to abscess and osteomyelitis with surgery on March 21".  Patient said that the wound was looked at in the ER yesterday and bandaged.  He doesn't want to open the dressing today, Patient was encouraged to get it checked by nursing staff tomorrow  Patient scored 3 on PHQ-9, with some difficulty  Recommendations  Based on my evaluation the patient does not appear to have an emergency medical condition.  Lewanda Rife, MD 07/09/23  12:29 PM

## 2023-07-09 NOTE — ED Notes (Signed)
 Writer entered pt's room to perform wound care to R thumb but pt refused. Pt states, "it will be alright until I see the doctor tomorrow. I have a appt with them tomorrow and they will redress it then, thank you anyway. Informed pt on importance of keeping area clean to promote wound healing. Pt states, "I understand but I'll wait until tomorrow". Provider made aware of pt's refusal. Informed pt to notify staff if any changes in sensation to area occur as thumb is currently covered with dry kerlix. Pt verbalized understanding and agreement. Will continue to monitor for safety.
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
## 2023-07-09 NOTE — ED Notes (Signed)
 Patient is the bedroom sleeping with eyes closed. NAD Respirations are even and unlabored. Will monitor for safety.
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
## 2023-07-09 NOTE — ED Notes (Signed)
 Patient A&Ox4. Denies intent to harm self/others when asked. Denies A/VH. Patient denies any physical complaints when asked. No acute distress noted. Pt states, "I feel better today. I'm gonna try to work on my cursing too". Support and encouragement provided. Routine safety checks conducted according to facility protocol. Encouraged patient to notify staff if thoughts of harm toward self or others arise. Patient verbalize understanding and agreement. Will continue to monitor for safety.

## 2023-07-10 ENCOUNTER — Encounter: Payer: Self-pay | Admitting: Nurse Practitioner

## 2023-07-10 ENCOUNTER — Other Ambulatory Visit: Payer: Self-pay

## 2023-07-10 ENCOUNTER — Encounter (HOSPITAL_COMMUNITY): Payer: Self-pay

## 2023-07-10 ENCOUNTER — Ambulatory Visit (INDEPENDENT_AMBULATORY_CARE_PROVIDER_SITE_OTHER): Payer: Self-pay | Admitting: Nurse Practitioner

## 2023-07-10 ENCOUNTER — Emergency Department (HOSPITAL_COMMUNITY)
Admission: EM | Admit: 2023-07-10 | Discharge: 2023-07-10 | Disposition: A | Discharge status: Home / Self Care | Attending: Emergency Medicine | Admitting: Emergency Medicine

## 2023-07-10 VITALS — BP 118/69 | HR 71 | Temp 97.4°F | Wt 142.0 lb

## 2023-07-10 DIAGNOSIS — M869 Osteomyelitis, unspecified: Secondary | ICD-10-CM

## 2023-07-10 DIAGNOSIS — Z09 Encounter for follow-up examination after completed treatment for conditions other than malignant neoplasm: Secondary | ICD-10-CM | POA: Insufficient documentation

## 2023-07-10 DIAGNOSIS — Z711 Person with feared health complaint in whom no diagnosis is made: Secondary | ICD-10-CM | POA: Insufficient documentation

## 2023-07-10 DIAGNOSIS — F1920 Other psychoactive substance dependence, uncomplicated: Secondary | ICD-10-CM

## 2023-07-10 DIAGNOSIS — Z5900 Homelessness unspecified: Secondary | ICD-10-CM | POA: Diagnosis present

## 2023-07-10 DIAGNOSIS — I429 Cardiomyopathy, unspecified: Secondary | ICD-10-CM

## 2023-07-10 DIAGNOSIS — F1120 Opioid dependence, uncomplicated: Secondary | ICD-10-CM

## 2023-07-10 DIAGNOSIS — F1910 Other psychoactive substance abuse, uncomplicated: Secondary | ICD-10-CM | POA: Diagnosis not present

## 2023-07-10 MED ORDER — TRAZODONE HCL 50 MG PO TABS: 50.0000 mg | ORAL_TABLET | Freq: Every evening | ORAL | 0 refills | Status: DC | PRN

## 2023-07-10 MED ORDER — NICOTINE 21 MG/24HR TD PT24: 21.0000 mg | MEDICATED_PATCH | Freq: Every day | TRANSDERMAL | 0 refills | Status: DC

## 2023-07-10 MED ORDER — DOXYCYCLINE HYCLATE 100 MG PO TABS: 100.0000 mg | ORAL_TABLET | Freq: Two times a day (BID) | ORAL | 0 refills | Status: AC

## 2023-07-10 NOTE — Assessment & Plan Note (Deleted)
 Encouraged to utilize information provided him at [facility] Was also referred to psychiatrist for addiction counseling

## 2023-07-10 NOTE — ED Provider Notes (Signed)
 [place] EMERGENCY DEPARTMENT AT [facility] Provider Note   CSN: [id number] Arrival date & time: 07/10/23  1539     History  Chief Complaint  Patient presents with   Homeless    [name] is a 28 y.o. male.  28 year old male here today because he is currently homeless and would like additional resources on her shelters in the area.  He was seen at [facility] today and evaluated.        Home Medications Prior to Admission medications   Medication Sig Start Date End Date Taking? Authorizing Provider  doxycycline (VIBRA-TABS) 100 MG tablet Take 1 tablet (100 mg total) by mouth 2 (two) times daily. 07/10/23 08/14/23  Lance Muss, MD  nicotine (NICODERM CQ - DOSED IN MG/24 HOURS) 21 mg/24hr patch Place 1 patch (21 mg total) onto the skin daily. Patient not taking: Reported on 07/10/2023 07/10/23   Lance Muss, MD  traZODone (DESYREL) 50 MG tablet Take 1 tablet (50 mg total) by mouth at bedtime as needed for sleep. Patient not taking: Reported on 07/10/2023 07/10/23   Lance Muss, MD      Allergies    Patient has no known allergies.    Review of Systems   Review of Systems  Physical Exam Updated Vital Signs BP 119/70   Pulse 75   Temp 97.7 F (36.5 C) (Oral)   Resp 18   Ht 5\' 9"  (1.753 m)   Wt 64.4 kg   SpO2 98%   BMI 20.97 kg/m  Physical Exam Vitals reviewed.  HENT:     Head: Normocephalic.  Eyes:     Pupils: Pupils are equal, round, and reactive to light.  Cardiovascular:     Rate and Rhythm: Normal rate.  Pulmonary:     Effort: Pulmonary effort is normal.  Abdominal:     General: Abdomen is flat.  Musculoskeletal:        General: Normal range of motion.     Cervical back: Normal range of motion.  Skin:    General: Skin is warm.  Neurological:     General: No focal deficit present.     Mental Status: He is alert.     ED Results / Procedures / Treatments   Labs (all labs ordered are listed, but only abnormal results are  displayed) Labs Reviewed - No data to display  EKG None  Radiology No results found.  Procedures Procedures    Medications Ordered in ED Medications - No data to display  ED Course/ Medical Decision Making/ A&P                                 Medical Decision Making 28 year old male here today looking for shelter resources.  Plan-patient overall well-appearing.  He has normal vital signs.  No SI, no HI.  Patient not exhibiting any psychotic features, forward thinking.  He states that he wants to "get his life back on the right track."  He is requesting additional resources in the area.  Provided him with additional information, bus pass.  Will discharge.  This patient's health care is complicated by the following social determinants of health-housing insecurity.           Final Clinical Impression(s) / ED Diagnoses Final diagnoses:  Homeless    Rx / DC Orders ED Discharge Orders     None  Anders Simmonds T, DO 07/10/23 [phone]

## 2023-07-10 NOTE — Assessment & Plan Note (Addendum)
 Encouraged to use resources provided at the hospital to locate homeless shelters Has an appointment in [place] tomorrow for a homeless shelter Food from the clinic pantry provided

## 2023-07-10 NOTE — Assessment & Plan Note (Signed)
 Hospital chart reviewed, including discharge summary Medications reconciled and reviewed with the patient in detail

## 2023-07-10 NOTE — Discharge Planning (Signed)
 Patient discharged prior to LCSW being able to thoroughly assess.  LCSW was able to speak with the patient briefly regarding disposition plans.  Patient reported that he needed to be discharged in order to make it to his appointment at 1:30 PM for his thumb.  Patient reported to this clinician that his plan was to return back to the [facility] after his appointment.  LCSW explored current needs and patient reported he needs to find a job and a place to stay as he has been living on the streets of [place] for a while.  Patient reports having no social support. LCSW was able to provide the patient with a list of long-term residential facilities within the area.  LCSW explained to patient the process of [facility].  Patient reports his plan is to follow up with [facility] regarding bed availability.  Patient was appreciative of the conversation with LCSW.  Patient was observed being on the phone, however hung the phone up stating that he was told by [facility] that they have no beds available. LCSW did call to confirm. Next available bed is in [place], [place].  LCSW encouraged the patient to continue to call other resources on list in order to secure placement for himself. Patient reported "man I need to get back to work". No other needs were reported by the patient at this time.  LCSW to sign off at this time.  Fernande Boyden, LCSW Clinical Social Worker [facility] Ph: [phone]

## 2023-07-10 NOTE — ED Notes (Addendum)
 Patient A&Ox4. Denies intent to harm self/others when asked. Denies A/VH. Pt agitated talking about mother and his brother. Excessive use of profanity. Not easily redirected. R thumb dressed. No s/s of drainage to drsg noted. Pt denies concerns. Support and encouragement provided. Routine safety checks conducted according to facility protocol. Encouraged patient to notify staff if thoughts of harm toward self or others arise. Patient verbalize understanding and agreement. Will continue to monitor for safety.

## 2023-07-10 NOTE — Assessment & Plan Note (Signed)
 Continue doxycycline 100 mg twice daily Follow-up with ID and hand surgeon as planned

## 2023-07-10 NOTE — ED Notes (Signed)
 Patient is sleeping. Respirations equal and unlabored, skin warm and dry. No change in assessment or acuity. Routine safety checks conducted according to facility protocol. Will continue to monitor for safety.

## 2023-07-10 NOTE — Discharge Instructions (Addendum)

## 2023-07-10 NOTE — ED Notes (Signed)
 Patient A&O x 4, ambulatory. Patient discharged in no acute distress. Patient denied SI/HI, A/VH upon discharge. Patient verbalized understanding of all discharge instructions reviewed on AVS via staff, to include follow up appointments, RX's and safety. Suicide safety plan completed and reviewed with Clinical research associate. A copy given to pt. Pt belongings returned to patient from locker #30 intact. Patient escorted to [place] via staff for transport to destination. Safety maintained.

## 2023-07-10 NOTE — ED Provider Notes (Addendum)
 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 Patient presented with thoughts of homicide toward 2 of his friends with whom he had an argument.   Stay Summary: The patient was evaluated each day by a clinical provider to ascertain response to treatment. Improvement was noted by the patient's report of decreasing symptoms, improved sleep and appetite, affect, medication tolerance, behavior, and participation in unit programming.  Patient was asked each day to complete a self inventory noting mood, mental status, pain, new symptoms, anxiety and concerns.  The patient's medications were managed with the following directions: Patient was offered a trial of antidepressant and anxiolytic, at this point in time patient had declined psychotropic medicines   Patient responded well to medication and being in a therapeutic and supportive environment. Positive and appropriate behavior was noted and the patient was motivated for recovery. The patient worked closely with the treatment team and case manager to develop a discharge plan with appropriate goals. Coping skills, problem solving as well as relaxation therapies were also part of the unit programming.   Patient denies SI today. He states "I am mad at my mom and brother but I will not kill them". Patient declines for provider to contact his mother and brother. Contact information is also not in the chart. Patient has denied SI and HI for >48 hours, no evidence of mania and psychosis. We attempted to duty to warn and patient states that he does not know  contacts.  Total Time spent with patient: 20 minutes  Past Psychiatric History: Patient reports history of depression, anxiety, and substance abuse.  Past Medical History: Asthma and acid reflux Family History: None reported by patient Social History: Patient is single and unemployed.  He reports he is homeless Tobacco Cessation:  A prescription for an FDA-approved tobacco cessation medication provided at discharge  Current Medications:  Current Facility-Administered Medications  Medication Dose Route Frequency Provider Last Rate Last Admin   acetaminophen (TYLENOL) tablet 650 mg  650 mg Oral Q6H PRN Lauro Franklin, MD   650 mg at 07/09/23 1935   alum & mag hydroxide-simeth (MAALOX/MYLANTA) 200-200-20 MG/5ML suspension 30 mL  30 mL Oral Q4H PRN Lauro Franklin, MD       dicyclomine (BENTYL) tablet 20 mg  20 mg Oral Q6H PRN Lauro Franklin, MD       haloperidol (HALDOL) tablet 5 mg  5 mg Oral TID PRN Lauro Franklin, MD       And   diphenhydrAMINE (BENADRYL) capsule 50 mg  50 mg Oral TID PRN Lauro Franklin, MD       haloperidol lactate (HALDOL) injection 5 mg  5 mg Intramuscular TID PRN Lauro Franklin, MD       And   diphenhydrAMINE (BENADRYL) injection 50 mg  50 mg Intramuscular TID PRN Lauro Franklin, MD       And   LORazepam (ATIVAN) injection 2 mg  2 mg Intramuscular TID PRN Lauro Franklin, MD       doxycycline (VIBRA-TABS) tablet 100 mg  100 mg Oral BID Ajibola, Ene A,  NP   100 mg at 07/10/23 0900   hydrOXYzine (ATARAX) tablet 25 mg  25 mg Oral TID PRN Lauro Franklin, MD       loperamide (IMODIUM) capsule 2-4 mg  2-4 mg Oral PRN Renaldo Fiddler, Mardelle Matte, MD       LORazepam (ATIVAN) tablet 1 mg  1 mg Oral Q6H PRN Renaldo Fiddler Mardelle Matte, MD       magnesium hydroxide (MILK OF MAGNESIA) suspension 30 mL  30 mL Oral Daily PRN Pashayan, Mardelle Matte, MD       methocarbamol (ROBAXIN) tablet 500 mg  500 mg Oral Q8H PRN Lauro Franklin, MD       multivitamin with minerals tablet 1 tablet  1 tablet Oral Daily Lauro Franklin, MD   1 tablet at 07/10/23 0935   naproxen (NAPROSYN) tablet 500 mg  500 mg Oral BID PRN Lauro Franklin, MD   500 mg at 07/08/23 2257   nicotine polacrilex (NICORETTE) gum 2 mg  2 mg Oral PRN Lauro Franklin, MD   2 mg at 07/10/23 0935   ondansetron (ZOFRAN-ODT) disintegrating tablet 4 mg  4 mg Oral Q6H PRN Lauro Franklin, MD       thiamine (VITAMIN B1) tablet 100 mg  100 mg Oral Daily Lauro Franklin, MD   100 mg at 07/10/23 0935   traZODone (DESYREL) tablet 50 mg  50 mg Oral QHS PRN Lauro Franklin, MD   50 mg at 07/09/23 2101   Current Outpatient Medications  Medication Sig Dispense Refill   doxycycline (VIBRA-TABS) 100 MG tablet Take 1 tablet (100 mg total) by mouth 2 (two) times daily. 84 tablet 0   ascorbic acid (VITAMIN C) 1000 MG tablet Take 1 tablet (1,000 mg total) by mouth daily. (Patient not taking: Reported on 07/06/2023) 30 tablet 1   nicotine (NICODERM CQ - DOSED IN MG/24 HOURS) 21 mg/24hr patch Place 1 patch (21 mg total) onto the skin daily. 28 patch 0   polyethylene glycol (MIRALAX / GLYCOLAX) 17 g packet Take 17 g by mouth daily. 14 each 0   traZODone (DESYREL) 50 MG tablet Take 1 tablet (50 mg total) by mouth at bedtime as needed for sleep. 14 tablet 0    PTA Medications:  Facility Ordered Medications  Medication   acetaminophen (TYLENOL) tablet 650 mg   alum & mag hydroxide-simeth (MAALOX/MYLANTA) 200-200-20 MG/5ML suspension 30 mL   magnesium hydroxide (MILK OF MAGNESIA) suspension 30 mL   haloperidol (HALDOL) tablet 5 mg   And   diphenhydrAMINE (BENADRYL) capsule 50 mg   haloperidol lactate (HALDOL) injection 5 mg   And   diphenhydrAMINE (BENADRYL) injection 50 mg   And   LORazepam (ATIVAN) injection 2 mg   hydrOXYzine (ATARAX) tablet 25 mg   traZODone (DESYREL) tablet 50 mg   dicyclomine (BENTYL) tablet 20 mg   loperamide  (IMODIUM) capsule 2-4 mg   methocarbamol (ROBAXIN) tablet 500 mg   naproxen (NAPROSYN) tablet 500 mg   ondansetron (ZOFRAN-ODT) disintegrating tablet 4 mg   thiamine (VITAMIN B1) tablet 100 mg   multivitamin with minerals tablet 1 tablet   LORazepam (ATIVAN) tablet 1 mg   doxycycline (VIBRA-TABS) tablet 100 mg   nicotine polacrilex (NICORETTE) gum 2 mg   PTA Medications  Medication Sig   doxycycline (VIBRA-TABS) 100 MG tablet Take 1 tablet (100 mg total) by mouth 2 (two) times daily.   ascorbic acid (VITAMIN C) 1000 MG tablet Take 1 tablet (1,000 mg  total) by mouth daily. (Patient not taking: Reported on 07/06/2023)   polyethylene glycol (MIRALAX / GLYCOLAX) 17 g packet Take 17 g by mouth daily.   nicotine (NICODERM CQ - DOSED IN MG/24 HOURS) 21 mg/24hr patch Place 1 patch (21 mg total) onto the skin daily.   traZODone (DESYREL) 50 MG tablet Take 1 tablet (50 mg total) by mouth at bedtime as needed for sleep.       07/09/2023   12:27 PM 07/09/2023   12:01 PM 07/08/2023    6:13 PM  Depression screen PHQ 2/9  Decreased Interest 1 1 1   Down, Depressed, Hopeless 1 1 1   PHQ - 2 Score 2 2 2   Altered sleeping 0 0 1  Tired, decreased energy 1 1 1   Change in appetite 0 0 0  Feeling bad or failure about yourself  0 0 0  Trouble concentrating 0 0 1  Moving slowly or fidgety/restless 0 0 1  Suicidal thoughts 0 0 0  PHQ-9 Score 3 3 6   Difficult doing work/chores Somewhat difficult Somewhat difficult Very difficult    Flowsheet Row ED from 07[facility]08/2023 in [facility] Most recent reading at 07/08/2023  5:58 PM ED from 07/08/2023 in [facility] Most recent reading at 07/08/2023  4:44 AM ED from 07/07/2023 in [facility] Emergency Department at [facility] Most recent reading at 07/07/2023  9:20 PM  C-SSRS RISK CATEGORY No Risk No Risk No Risk       Musculoskeletal  Strength & Muscle Tone: within normal limits Gait & Station:  normal Patient leans: N/A  Psychiatric Specialty Exam  Presentation  General Appearance:  Appropriate for Environment; Fairly Groomed  Eye Contact: Fair  Speech: Clear and Coherent; Normal Rate  Speech Volume: Normal  Handedness: Right   Mood and Affect  Mood: Labile; Irritable  Affect: Congruent   Thought Process  Thought Processes: Coherent; Linear  Descriptions of Associations:Intact  Orientation:Full (Time, Place and Person)  Thought Content:Logical; WDL  Diagnosis of Schizophrenia or Schizoaffective disorder in past: No    Hallucinations:Hallucinations: None  Ideas of Reference:None  Suicidal Thoughts:Suicidal Thoughts: No  Homicidal Thoughts:Homicidal Thoughts: No   Sensorium  Memory: Remote Good  Judgment: Fair  Insight: Fair   Art therapist  Concentration: Poor  Attention Span: Poor  Recall: Fair  Fund of Knowledge: Fair  Language: Good   Psychomotor Activity  Psychomotor Activity:Psychomotor Activity: Normal   Assets  Assets: Communication Skills; Resilience   Sleep  Sleep:Sleep: Good   Physical Exam  General: Pleasant, well-appearing. No acute distress. Pulmonary: Normal effort. No wheezing or rales. Skin: No obvious rash or lesions. Neuro: A&Ox3.No focal deficit.   Review of Systems  No reported symptoms  Blood pressure 101/61, pulse 62, temperature 98.9 F (37.2 C), temperature source Oral, resp. rate 18, SpO2 98%. There is no height or weight on file to calculate BMI.  Demographic Factors:  Male, Caucasian, and Unemployed  Loss Factors: Legal issues  Historical Factors: Impulsivity  Risk Reduction Factors:   Positive coping skills or problem solving skills  Continued Clinical Symptoms:  Severe Anxiety and/or Agitation  Cognitive Features That Contribute To Risk:  Closed-mindedness    Suicide Risk:  Mild:  Suicidal ideation of limited frequency, intensity, duration, and  specificity.  There are no identifiable plans, no associated intent, mild dysphoria and related symptoms, good self-control (both objective and subjective assessment), few other risk factors, and identifiable protective factors, including available and accessible social support.  Plan Of Care/Follow-up recommendations:  Activity as tolerated Regular diet Continue prescription medications See PCP for medical conditions   Disposition: Shelter-given resources  Lance Muss, MD 07/10/2023, 11:19 AM

## 2023-07-10 NOTE — Assessment & Plan Note (Signed)
 Encouraged to utilize information provided him at [facility] Was also referred to psychiatrist for addiction counseling Need to avoid use of illicit drugs discussed

## 2023-07-10 NOTE — Progress Notes (Signed)
 New Patient Office Visit  Subjective:  Patient ID: [name], male    DOB: 27-Jun-1995  Age: 28 y.o. MRN: [id number]  CC:  Chief Complaint  Patient presents with   Hospitalization Follow-up    HPI [name] is a 28 y.o. male  has a past medical history of Acid reflux and Asthma.  Cardiomyopathy of unknown etiology, homelessness, polysubstance abuse patient presented establish care for his chronic medical conditions and for hospital discharge follow-up.  He is currently homeless  Patient was on admission from 06/29/2023 to 07/04/2023 for right thumb abscess and osteomyelitis, patient underwent incision and drainage by hand surgeon, infectious disease was consulted, received IV antibiotics while at the hospital and was discharged on doxycycline for 6 weeks with recommendations for him to follow-up with infectious disease specialist and hand surgery.  Has a dressing on the right thumb today, stated that the dressing was changed yesterday and declined examination of the thumb today.   He was also at the [facility] behavioral health center today  where he had walked in voluntarily for his substance abuse treatment for methamphetamine and fentanyl abuse also had homicidal ideation towards 2 of his friends with whom he had an argument.  He stated that he is staying in an abandoned area with drug addicts.  Has family members around but states that his family members do not care about him.  Stated that he has been homeless for 2 years.  He is interested in drug rehab but he had declined inpatient rehab services and was provided outpatient rehab referral. Has an appointment for placement to a homeless shelter in [place] tomorrow, he plans to keep the appointment.  He was also provided sober living resources while at the [facility]  Patient denies depression states that he just needs help to quit using drugs.  He currently denies SI, HI  He currently does not have a cell phone, patient to stop by  this clinic as needed   Past Medical History:  Diagnosis Date   Acid reflux    Asthma     Past Surgical History:  Procedure Laterality Date   ESOPHAGUS SURGERY     INCISION AND DRAINAGE ABSCESS Right 06/30/2023   Procedure: Incision and drainage right thumb felon with debridement of osteomyelitis;  Surgeon: Betha Loa, MD;  Location: [facility] OR;  Service: Orthopedics;  Laterality: Right;  I & D INFECTED RIGHT THUMB    History reviewed. No pertinent family history.  Social History   Socioeconomic History   Marital status: Single    Spouse name: Not on file   Number of children: Not on file   Years of education: Not on file   Highest education level: Not on file  Occupational History   Not on file  Tobacco Use   Smoking status: Former    Current packs/day: 0.50    Types: Cigarettes   Smokeless tobacco: Never  Substance and Sexual Activity   Alcohol use: Not Currently    Comment: occasional    Drug use: Yes    Types: Cocaine, "Crack" cocaine, Methamphetamines    Comment: previously used cocaine in 2016   Sexual activity: Yes  Other Topics Concern   Not on file  Social History Narrative   Homless   Social Drivers of Health   Financial Resource Strain: Not on file  Food Insecurity: Food Insecurity Present (07/08/2023)   Hunger Vital Sign    Worried About Running Out of Food in the Last Year: Never  true    Ran Out of Food in the Last Year: Sometimes true  Transportation Needs: No Transportation Needs (07/08/2023)   PRAPARE - Administrator, Civil Service (Medical): No    Lack of Transportation (Non-Medical): No  Recent Concern: Transportation Needs - Unmet Transportation Needs (07/06/2023)   PRAPARE - Administrator, Civil Service (Medical): Yes    Lack of Transportation (Non-Medical): Yes  Physical Activity: Not on file  Stress: Not on file  Social Connections: Unknown (08/24/2021)   Received from [facility]   Social Network    Social  Network: Not on file  Intimate Partner Violence: Not At Risk (07/08/2023)   Humiliation, Afraid, Rape, and Kick questionnaire    Fear of Current or Ex-Partner: No    Emotionally Abused: No    Physically Abused: No    Sexually Abused: No    ROS Review of Systems  Constitutional:  Negative for appetite change, chills, fatigue and fever.  HENT:  Negative for congestion, postnasal drip, rhinorrhea and sneezing.   Respiratory:  Negative for cough, shortness of breath and wheezing.   Cardiovascular:  Negative for chest pain, palpitations and leg swelling.  Gastrointestinal:  Negative for abdominal pain, constipation, nausea and vomiting.  Genitourinary:  Negative for difficulty urinating, dysuria, flank pain and frequency.  Musculoskeletal:  Negative for arthralgias, back pain, joint swelling and myalgias.  Skin:  Positive for wound. Negative for color change, pallor and rash.  Neurological:  Negative for dizziness, facial asymmetry, weakness, numbness and headaches.  Psychiatric/Behavioral:  Negative for behavioral problems, confusion, self-injury and suicidal ideas.     Objective:   Today's Vitals: BP 118/69   Pulse 71   Temp (!) 97.4 F (36.3 C)   Wt 142 lb (64.4 kg)   SpO2 99%   BMI 20.97 kg/m   Physical Exam Vitals and nursing note reviewed.  Constitutional:      General: He is not in acute distress.    Appearance: Normal appearance. He is obese. He is not ill-appearing, toxic-appearing or diaphoretic.  HENT:     Mouth/Throat:     Mouth: Mucous membranes are moist.     Pharynx: Oropharynx is clear. No oropharyngeal exudate or posterior oropharyngeal erythema.  Eyes:     General: No scleral icterus.       Right eye: No discharge.        Left eye: No discharge.     Extraocular Movements: Extraocular movements intact.     Conjunctiva/sclera: Conjunctivae normal.  Cardiovascular:     Rate and Rhythm: Normal rate and regular rhythm.     Pulses: Normal pulses.     Heart  sounds: Normal heart sounds. No murmur heard.    No friction rub. No gallop.  Pulmonary:     Effort: Pulmonary effort is normal. No respiratory distress.     Breath sounds: Normal breath sounds. No stridor. No wheezing, rhonchi or rales.  Chest:     Chest wall: No tenderness.  Abdominal:     General: There is no distension.     Palpations: Abdomen is soft.     Tenderness: There is no abdominal tenderness. There is no right CVA tenderness, left CVA tenderness or guarding.  Musculoskeletal:        General: Signs of injury present. No swelling, tenderness or deformity.     Right lower leg: No edema.     Left lower leg: No edema.     Comments: Dressing in place on  right thumb, dressing is clean and dry, unable to assess site  Skin:    General: Skin is warm and dry.     Capillary Refill: Capillary refill takes less than 2 seconds.     Coloration: Skin is not jaundiced or pale.     Findings: No bruising, erythema or lesion.  Neurological:     Mental Status: He is alert and oriented to person, place, and time.     Motor: No weakness.     Coordination: Coordination normal.     Gait: Gait normal.  Psychiatric:        Mood and Affect: Mood normal.        Behavior: Behavior normal.        Thought Content: Thought content normal.        Judgment: Judgment normal.     Assessment & Plan:   Problem List Items Addressed This Visit       Cardiovascular and Mediastinum   Cardiomyopathy (HCC)   Currently denies chest pain, shortness of breath We will do close referral to cardiology at next visit        Musculoskeletal and Integument   Osteomyelitis of finger (HCC) - Primary   Continue doxycycline 100 mg twice daily Follow-up with ID and hand surgeon as planned        Other   Polysubstance dependence including opioid type drug, continuous use (HCC)   Encouraged to utilize information provided him at [facility] Was also referred to psychiatrist for addiction counseling Need to avoid use  of illicit drugs discussed      Relevant Orders   AMB Referral [facility]I Care Management   Ambulatory referral to Psychiatry   Homeless   Encouraged to use resources provided at the hospital to locate homeless shelters Has an appointment in [place] tomorrow for a homeless shelter Food from the clinic pantry provided      Hospital discharge follow-up   Hospital chart reviewed, including discharge summary Medications reconciled and reviewed with the patient in detail        Outpatient Encounter Medications as of 07/10/2023  Medication Sig   doxycycline (VIBRA-TABS) 100 MG tablet Take 1 tablet (100 mg total) by mouth 2 (two) times daily.   nicotine (NICODERM CQ - DOSED IN MG/24 HOURS) 21 mg/24hr patch Place 1 patch (21 mg total) onto the skin daily. (Patient not taking: Reported on 07/10/2023)   traZODone (DESYREL) 50 MG tablet Take 1 tablet (50 mg total) by mouth at bedtime as needed for sleep. (Patient not taking: Reported on 07/10/2023)   [DISCONTINUED] acetaminophen (TYLENOL) tablet 650 mg    [DISCONTINUED] alum & mag hydroxide-simeth (MAALOX/MYLANTA) 200-200-20 MG/5ML suspension 30 mL    [DISCONTINUED] dicyclomine (BENTYL) tablet 20 mg    [DISCONTINUED] diphenhydrAMINE (BENADRYL) capsule 50 mg    [DISCONTINUED] diphenhydrAMINE (BENADRYL) injection 50 mg    [DISCONTINUED] doxycycline (VIBRA-TABS) tablet 100 mg    [DISCONTINUED] haloperidol (HALDOL) tablet 5 mg    [DISCONTINUED] haloperidol lactate (HALDOL) injection 5 mg    [DISCONTINUED] hydrOXYzine (ATARAX) tablet 25 mg    [DISCONTINUED] loperamide (IMODIUM) capsule 2-4 mg    [DISCONTINUED] LORazepam (ATIVAN) injection 2 mg    [DISCONTINUED] LORazepam (ATIVAN) tablet 1 mg    [DISCONTINUED] magnesium hydroxide (MILK OF MAGNESIA) suspension 30 mL    [DISCONTINUED] methocarbamol (ROBAXIN) tablet 500 mg    [DISCONTINUED] multivitamin with minerals tablet 1 tablet    [DISCONTINUED] naproxen (NAPROSYN) tablet 500 mg    [DISCONTINUED]  nicotine polacrilex (NICORETTE) gum 2  mg    [DISCONTINUED] ondansetron (ZOFRAN-ODT) disintegrating tablet 4 mg    [DISCONTINUED] thiamine (VITAMIN B1) tablet 100 mg    [DISCONTINUED] traZODone (DESYREL) tablet 50 mg    No facility-administered encounter medications on file as of 07/10/2023.    Follow-up: Return in about 2 months (around 09/09/2023).   Donell Beers, FNP

## 2023-07-10 NOTE — Assessment & Plan Note (Signed)
 Currently denies chest pain, shortness of breath We will do close referral to cardiology at next visit

## 2023-07-10 NOTE — Progress Notes (Signed)
 Marland Kitchen

## 2023-07-10 NOTE — ED Triage Notes (Signed)
 Pt states he is homeless and needs resources. Denies SI/HI.

## 2023-07-10 NOTE — Patient Instructions (Addendum)
 ID follow up 08/07/2023 at 2PM with Dr. Daiva Eves at the [facility], which  is located in the [facility] at [address] in [place].    [facility] [address] [place] [address] [phone]   It is important that you exercise regularly at least 30 minutes 5 times a week as tolerated  Think about what you will eat, plan ahead. Choose " clean, green, fresh or frozen" over canned, processed or packaged foods which are more sugary, salty and fatty. 70 to 75% of food eaten should be vegetables and fruit. Three meals at set times with snacks allowed between meals, but they must be fruit or vegetables. Aim to eat over a 12 hour period , example 7 am to 7 pm, and STOP after  your last meal of the day. Drink water,generally about 64 ounces per day, no other drink is as healthy. Fruit juice is best enjoyed in a healthy way, by EATING the fruit.  Thanks for choosing [facility] we consider it a privelige to serve you.

## 2023-07-11 ENCOUNTER — Ambulatory Visit (HOSPITAL_COMMUNITY): Admission: EM | Admit: 2023-07-11 | Discharge: 2023-07-11 | Disposition: A | Discharge status: Home / Self Care

## 2023-07-11 DIAGNOSIS — Z765 Malingerer [conscious simulation]: Secondary | ICD-10-CM

## 2023-07-11 DIAGNOSIS — Z5900 Homelessness unspecified: Secondary | ICD-10-CM

## 2023-07-11 NOTE — ED Provider Notes (Signed)
 Behavioral Health Urgent Care Medical Screening Exam  Patient Name: [name] MRN: [id number] Date of Evaluation: 07/11/23 Chief Complaint:   Diagnosis:  Final diagnoses:  Homelessness  Malingering    History of Present illness: [name] is a 28 y.o. male. With a history of polysubstance abuse, homelessness, presented to [facility] voluntarily.  Per the patient he just needs somewhere for a couple of days that he came get some rest.  A review of patient's records show that patient has had multiple admissions.  Also patient was just seen in the ED and discharge.  Patient stated that he is currently homeless and he got nowhere to go and that shelters are full so he needs only just to stay for a couple of days.  According to the patient he was working at [employer] and they found out he was homeless so they may have to go. Patient also stated that he just tell us anything going here to make it sound worse so he can get a place to stay.  Face-to-face observation of patient, patient is alert and oriented x 4, speech is clear, maintain eye contact.  Patient denies SI, HI, AVH or paranoia at this time.  Denies access to guns and does not appear to be a threat to himself or others.  Patient endorsed that he last used methamphetamines a couple days ago.  Patient also stated that he has been homeless for the past 2 years and he does not have any family in the area and when they want help and help them but they do not want to help him.  Patient also stated that I see things to make it sound worse so I can get a place to stay.  Patient does appear to be malingering seeking secondary gain because of homelessness.  Writer discussed with patient that he needs to reach out to the shelters for assistance with housing.  Patient does not seem to be influenced by internal or external stimuli.  But does appear to be malingering. At the time of this evaluation patient does not pose a risk to himself or others.   Patient is advised to call 911 or return to the nearest ED should he experience homicidal thoughts or suicidal thoughts or hallucination.  Recommend discharge for patient to follow up with the shelters.  Flowsheet Row ED from 07/11/2023 in [facility] ED from 07/10/2023 in [facility] Emergency Department at [facility] ED from 07/08/2023 in [facility]  C-SSRS RISK CATEGORY No Risk No Risk No Risk       Psychiatric Specialty Exam  Presentation  General Appearance:Appropriate for Environment  Eye Contact:Fair  Speech:Clear and Coherent  Speech Volume:Normal  Handedness:Right   Mood and Affect  Mood: Angry  Affect: Congruent   Thought Process  Thought Processes: Coherent  Descriptions of Associations:Intact  Orientation:Full (Time, Place and Person)  Thought Content:WDL  Diagnosis of Schizophrenia or Schizoaffective disorder in past: No   Hallucinations:None  Ideas of Reference:None  Suicidal Thoughts:No  Homicidal Thoughts:No   Sensorium  Memory: Immediate Fair  Judgment: Poor  Insight: Poor   Executive Functions  Concentration: Fair  Attention Span: Fair  Recall: Fair  Fund of Knowledge: Fair  Language: Fair   Psychomotor Activity  Psychomotor Activity: Normal   Assets  Assets: Desire for Improvement; Housing; Vocational/Educational   Sleep  Sleep: Fair  Number of hours:  8   Physical Exam: Physical Exam HENT:  Head: Normocephalic.     Nose: Nose normal.  Eyes:     Pupils: Pupils are equal, round, and reactive to light.  Cardiovascular:     Rate and Rhythm: Normal rate.  Pulmonary:     Effort: Pulmonary effort is normal.  Musculoskeletal:        General: Normal range of motion.     Cervical back: Normal range of motion.  Neurological:     General: No focal deficit present.     Mental Status: He is alert.  Psychiatric:        Mood and  Affect: Mood normal.        Behavior: Behavior normal.        Thought Content: Thought content normal.        Judgment: Judgment normal.    Review of Systems  Constitutional: Negative.   HENT: Negative.    Eyes: Negative.   Respiratory: Negative.    Cardiovascular: Negative.   Gastrointestinal: Negative.   Genitourinary: Negative.   Musculoskeletal: Negative.   Skin: Negative.   Neurological: Negative.   Psychiatric/Behavioral:  The patient is nervous/anxious.    There were no vitals taken for this visit. There is no height or weight on file to calculate BMI.  Musculoskeletal: Strength & Muscle Tone: within normal limits Gait & Station: normal Patient leans: N/A   [facility] MSE Discharge Disposition for Follow up and Recommendations: Based on my evaluation the patient does not appear to have an emergency medical condition and can be discharged with resources and follow up care in outpatient services for Individual Therapy   Sindy Guadeloupe, NP 07/11/2023, 12:37 AM

## 2023-07-11 NOTE — Discharge Instructions (Signed)
 [facility] with [facility]
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
## 2023-07-11 NOTE — ED Notes (Signed)
 Patient A&O x 4, ambulatory. Patient discharged in no acute distress. Patient denied SI/HI, A/VH upon discharge. Patient verbalized understanding of all discharge instructions reviewed on AVS via staff, to include follow up appointments, and safety.  Pt belongings returned to patient from black locker intact. Patient escorted to lobby via staff for transport to destination. Safety maintained.

## 2023-07-13 ENCOUNTER — Emergency Department (HOSPITAL_COMMUNITY): Payer: MEDICAID

## 2023-07-13 ENCOUNTER — Other Ambulatory Visit: Payer: Self-pay

## 2023-07-13 ENCOUNTER — Emergency Department (HOSPITAL_COMMUNITY)
Admission: EM | Admit: 2023-07-13 | Discharge: 2023-07-13 | Disposition: A | Discharge status: Home / Self Care | Payer: MEDICAID | Attending: Emergency Medicine | Admitting: Emergency Medicine

## 2023-07-13 DIAGNOSIS — Z5189 Encounter for other specified aftercare: Secondary | ICD-10-CM

## 2023-07-13 DIAGNOSIS — Z4800 Encounter for change or removal of nonsurgical wound dressing: Secondary | ICD-10-CM | POA: Diagnosis present
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
## 2023-07-13 NOTE — ED Notes (Signed)
 Patient verbalizes understanding of discharge instructions. Opportunity for questioning and answers were provided. Pt discharged from ED.

## 2023-07-13 NOTE — ED Triage Notes (Signed)
 Patient states he had surgery on his R thumb 2 weeks ago. Wound still not closed and is draining. Swelling noted to same.

## 2023-07-13 NOTE — Discharge Instructions (Signed)
 Return for any problem.  Call Dr. Merrilee Seashore office and make an appointment for follow-up in his clinic.

## 2023-07-13 NOTE — ED Notes (Signed)
 Pt's R thumb wrapped with gauze and coban; pt given food and beverage

## 2023-07-16 ENCOUNTER — Emergency Department (HOSPITAL_COMMUNITY)
Admission: EM | Admit: 2023-07-16 | Discharge: 2023-07-16 | Discharge status: Against Medical Advice | Attending: Emergency Medicine | Admitting: Emergency Medicine

## 2023-07-16 ENCOUNTER — Other Ambulatory Visit: Payer: Self-pay

## 2023-07-16 ENCOUNTER — Encounter (HOSPITAL_COMMUNITY): Payer: Self-pay | Admitting: *Deleted

## 2023-07-16 DIAGNOSIS — M79644 Pain in right finger(s): Secondary | ICD-10-CM | POA: Diagnosis present

## 2023-07-16 DIAGNOSIS — L089 Local infection of the skin and subcutaneous tissue, unspecified: Secondary | ICD-10-CM | POA: Insufficient documentation

## 2023-07-16 DIAGNOSIS — Z5329 Procedure and treatment not carried out because of patient's decision for other reasons: Secondary | ICD-10-CM | POA: Diagnosis not present

## 2023-07-16 DIAGNOSIS — Z5321 Procedure and treatment not carried out due to patient leaving prior to being seen by health care provider: Secondary | ICD-10-CM

## 2023-07-16 MED ORDER — DOXYCYCLINE HYCLATE 100 MG PO TABS: 100.0000 mg | ORAL_TABLET | Freq: Once | ORAL | Status: DC

## 2023-07-16 MED ORDER — BACITRACIN ZINC 500 UNIT/GM EX OINT: TOPICAL_OINTMENT | Freq: Two times a day (BID) | CUTANEOUS | Status: DC
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
## 2023-07-16 NOTE — ED Notes (Signed)
 ED Provider at bedside.

## 2023-07-16 NOTE — ED Notes (Signed)
Pt seen walking out .  

## 2023-07-17 ENCOUNTER — Telehealth: Payer: Self-pay

## 2023-07-17 NOTE — Transitions of Care (Post Inpatient/ED Visit) (Cosign Needed)
   07/17/2023  Name: [name] MRN: [id number] DOB: [birth year]  Today's TOC FU Call Status: Today's TOC FU Call Status:: Unsuccessful Call (1st Attempt) Unsuccessful Call (1st Attempt) Date: 07/17/23  Attempted to reach the patient regarding the most recent Inpatient/ED visit.  Follow Up Plan: No further outreach attempts will be made at this time. We have been unable to contact the patient. Pt is homeless and could not be contacted.  Signature Renelda Loma RMA

## 2023-07-19 ENCOUNTER — Emergency Department (HOSPITAL_COMMUNITY)
Admission: EM | Admit: 2023-07-19 | Discharge: 2023-07-19 | Discharge status: Against Medical Advice | Attending: Emergency Medicine | Admitting: Emergency Medicine

## 2023-07-19 ENCOUNTER — Other Ambulatory Visit: Payer: Self-pay

## 2023-07-19 DIAGNOSIS — R200 Anesthesia of skin: Secondary | ICD-10-CM | POA: Diagnosis present

## 2023-07-19 DIAGNOSIS — M7989 Other specified soft tissue disorders: Secondary | ICD-10-CM | POA: Insufficient documentation

## 2023-07-19 DIAGNOSIS — Z5321 Procedure and treatment not carried out due to patient leaving prior to being seen by health care provider: Secondary | ICD-10-CM | POA: Diagnosis not present

## 2023-07-19 NOTE — ED Notes (Signed)
 Pt. Called for repeat vitals w/ no response. Moved OTF

## 2023-07-19 NOTE — ED Triage Notes (Signed)
 Pt. Stated, I had surgery on my rt. Thumb 2 weeks ago and Ive taken my antibiotic I accidentally hit it and it has a swollen area and some areas are painful and some are numb. I just want somebody to check it out.
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
## 2023-08-07 ENCOUNTER — Ambulatory Visit: Payer: Self-pay | Admitting: Infectious Disease

## 2023-08-07 DIAGNOSIS — F1910 Other psychoactive substance abuse, uncomplicated: Secondary | ICD-10-CM

## 2023-08-07 DIAGNOSIS — M869 Osteomyelitis, unspecified: Secondary | ICD-10-CM

## 2023-08-07 DIAGNOSIS — Z5900 Homelessness unspecified: Secondary | ICD-10-CM

## 2023-09-06 ENCOUNTER — Ambulatory Visit: Payer: Self-pay | Admitting: Nurse Practitioner

## 2023-10-21 ENCOUNTER — Ambulatory Visit (HOSPITAL_COMMUNITY)
Admission: EM | Admit: 2023-10-21 | Discharge: 2023-10-21 | Disposition: A | Discharge status: Home / Self Care | Attending: Psychiatry | Admitting: Psychiatry

## 2023-10-21 DIAGNOSIS — Z7689 Persons encountering health services in other specified circumstances: Secondary | ICD-10-CM

## 2023-10-21 DIAGNOSIS — Z008 Encounter for other general examination: Secondary | ICD-10-CM | POA: Insufficient documentation

## 2023-10-21 DIAGNOSIS — I429 Cardiomyopathy, unspecified: Secondary | ICD-10-CM | POA: Insufficient documentation

## 2023-10-21 DIAGNOSIS — F1911 Other psychoactive substance abuse, in remission: Secondary | ICD-10-CM | POA: Insufficient documentation

## 2023-10-21 DIAGNOSIS — Z5900 Homelessness unspecified: Secondary | ICD-10-CM | POA: Insufficient documentation

## 2023-10-21 DIAGNOSIS — Z87898 Personal history of other specified conditions: Secondary | ICD-10-CM
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
## 2023-10-21 NOTE — ED Notes (Signed)
 Patient discharged by staff. AVS reviewed with patient, taxi voucher provided. Patient stable and ambulatory upon discharge. No acute distress noted. Patient escorted to lobby via staff, safety maintained.

## 2023-10-21 NOTE — Discharge Instructions (Addendum)
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
## 2023-12-04 ENCOUNTER — Ambulatory Visit (HOSPITAL_COMMUNITY)
Admission: EM | Admit: 2023-12-04 | Discharge: 2023-12-04 | Disposition: A | Discharge status: Home / Self Care | Attending: Psychiatry | Admitting: Psychiatry

## 2023-12-04 DIAGNOSIS — Z5900 Homelessness unspecified: Secondary | ICD-10-CM

## 2023-12-04 DIAGNOSIS — F4322 Adjustment disorder with anxiety: Secondary | ICD-10-CM | POA: Diagnosis not present

## 2023-12-04 DIAGNOSIS — F1910 Other psychoactive substance abuse, uncomplicated: Secondary | ICD-10-CM | POA: Diagnosis not present
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
## 2023-12-04 NOTE — Progress Notes (Signed)
   12/04/23 1843  [place] Suicide Severity Rating Scale  1. In the past month -  Have you wished you were dead or wished you could go to sleep and not wake up? No  2. In the past month - Have you actually had any thoughts of killing yourself? No  6. Have you ever done anything, started to do anything, or prepared to do anything to end your life? No  C-SSRS RISK CATEGORY No Risk

## 2023-12-04 NOTE — Discharge Summary (Signed)
 [name] to be discharged Home per  NP order. An After Visit Summary was printed and given to the patient. Patient escorted out and discharged home via private auto.  [name]  12/04/2023 6:55 PM

## 2023-12-04 NOTE — Discharge Instructions (Addendum)
 Enclosed are some resources you can explore for your housing and shelter needs, as well as substance use rehabilitation and therapy as we discussed.   Get help right away if: You have thoughts about hurting yourself or others. Get help right away if you feel like you may hurt yourself or others, or have thoughts about taking your own life. Go to your nearest emergency room or: Call 911. Call the National Suicide Prevention Lifeline at (320)678-2952 or 988 in the [place] This is open 24 hours a day. If you're a Veteran: Call 988 and press 1. This is open 24 hours a day. Text the [employer] at 628-436-2195. Summary Mental health is not just the absence of mental illness. It involves understanding your emotions and behaviors, and taking steps to manage them in a healthy way. If you have symptoms of mental or emotional distress, get help from family, friends, a health care provider, or a mental health professional. Practice good mental health behaviors such as stress management skills, self-calming skills, exercise, healthy sleeping and eating, and supportive relationships. This information is not intended to replace advice given to you by your health care provider. Make sure you discuss any questions you have with your health care provider.  Education provided on the fact that if experiencing worsening of psychiatry symptoms including suicidal ideations, homicidal ideations, or having auditory/visual hallucinations, etc, to call 911, 988, come back to this location, or go to the nearest ER.  Pt verbalized understanding.
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
## 2023-12-05 ENCOUNTER — Encounter (HOSPITAL_COMMUNITY): Payer: Self-pay

## 2023-12-05 ENCOUNTER — Emergency Department (HOSPITAL_COMMUNITY)
Admission: EM | Admit: 2023-12-05 | Discharge: 2023-12-05 | Disposition: A | Discharge status: Home / Self Care | Payer: MEDICAID | Attending: Emergency Medicine | Admitting: Emergency Medicine

## 2023-12-05 ENCOUNTER — Other Ambulatory Visit: Payer: Self-pay

## 2023-12-05 ENCOUNTER — Emergency Department (HOSPITAL_COMMUNITY)
Admission: EM | Admit: 2023-12-05 | Discharge: 2023-12-05 | Discharge status: Against Medical Advice | Payer: Self-pay | Attending: Emergency Medicine | Admitting: Emergency Medicine

## 2023-12-05 ENCOUNTER — Ambulatory Visit (HOSPITAL_COMMUNITY): Admission: EM | Admit: 2023-12-05 | Discharge: 2023-12-05 | Disposition: A | Discharge status: Home / Self Care

## 2023-12-05 DIAGNOSIS — Z5900 Homelessness unspecified: Secondary | ICD-10-CM | POA: Insufficient documentation

## 2023-12-05 DIAGNOSIS — I959 Hypotension, unspecified: Secondary | ICD-10-CM | POA: Diagnosis present

## 2023-12-05 DIAGNOSIS — Z5329 Procedure and treatment not carried out because of patient's decision for other reasons: Secondary | ICD-10-CM | POA: Insufficient documentation

## 2023-12-05 DIAGNOSIS — Z59811 Housing instability, housed, with risk of homelessness: Secondary | ICD-10-CM | POA: Insufficient documentation

## 2023-12-05 DIAGNOSIS — F1910 Other psychoactive substance abuse, uncomplicated: Secondary | ICD-10-CM

## 2023-12-05 DIAGNOSIS — R4689 Other symptoms and signs involving appearance and behavior: Secondary | ICD-10-CM

## 2023-12-05 DIAGNOSIS — R001 Bradycardia, unspecified: Secondary | ICD-10-CM | POA: Insufficient documentation

## 2023-12-05 DIAGNOSIS — Z765 Malingerer [conscious simulation]: Secondary | ICD-10-CM

## 2023-12-05 LAB — COMPREHENSIVE METABOLIC PANEL WITH GFR
ALT: 24 U/L (ref 0–44)
AST: 30 U/L (ref 15–41)
Albumin: 3.2 g/dL — ABNORMAL LOW (ref 3.5–5.0)
Alkaline Phosphatase: 44 U/L (ref 38–126)
Anion gap: 10 (ref 5–15)
BUN: 16 mg/dL (ref 6–20)
CO2: 25 mmol/L (ref 22–32)
Calcium: 8.7 mg/dL — ABNORMAL LOW (ref 8.9–10.3)
Chloride: 102 mmol/L (ref 98–111)
Creatinine, Ser: 0.86 mg/dL (ref 0.61–1.24)
GFR, Estimated: >60 mL/min (ref >60)
Glucose, Bld: 101 mg/dL — ABNORMAL HIGH (ref 70–99)
Potassium: 3.4 mmol/L — ABNORMAL LOW (ref 3.5–5.1)
Sodium: 137 mmol/L (ref 135–145)
Total Bilirubin: 1.1 mg/dL (ref 0.0–1.2)
Total Protein: 5.7 g/dL — ABNORMAL LOW (ref 6.5–8.1)

## 2023-12-05 LAB — CBC WITH DIFFERENTIAL/PLATELET
Abs Immature Granulocytes: 0.01 K/uL (ref 0.00–0.07)
Basophils Absolute: 0 K/uL (ref 0.0–0.1)
Basophils Relative: 1 %
Eosinophils Absolute: 0.1 K/uL (ref 0.0–0.5)
Eosinophils Relative: 3 %
HCT: 39.4 % (ref 39.0–52.0)
Hemoglobin: 13.6 g/dL (ref 13.0–17.0)
Immature Granulocytes: 0 %
Lymphocytes Relative: 36 %
Lymphs Abs: 1.7 K/uL (ref 0.7–4.0)
MCH: 30.4 pg (ref 26.0–34.0)
MCHC: 34.5 g/dL (ref 30.0–36.0)
MCV: 87.9 fL (ref 80.0–100.0)
Monocytes Absolute: 0.8 K/uL (ref 0.1–1.0)
Monocytes Relative: 15 %
Neutro Abs: 2.2 K/uL (ref 1.7–7.7)
Neutrophils Relative %: 45 %
Platelets: UNDETERMINED K/uL (ref 150–400)
RBC: 4.48 MIL/uL (ref 4.22–5.81)
RDW: 12.3 % (ref 11.5–15.5)
WBC: 4.9 K/uL (ref 4.0–10.5)
nRBC: 0 % (ref 0.0–0.2)

## 2023-12-05 LAB — RAPID URINE DRUG SCREEN, HOSP PERFORMED
Amphetamines: POSITIVE — AB
Barbiturates: NOT DETECTED
Benzodiazepines: NOT DETECTED
Cocaine: POSITIVE — AB
Opiates: NOT DETECTED
Tetrahydrocannabinol: NOT DETECTED

## 2023-12-05 LAB — LACTIC ACID, PLASMA: Lactic Acid, Venous: 1.2 mmol/L (ref 0.5–1.9)

## 2023-12-05 LAB — ETHANOL: Alcohol, Ethyl (B): <15 mg/dL (ref <15)

## 2023-12-05 MED ORDER — LACTATED RINGERS IV BOLUS
2000.0000 mL | Freq: Once | INTRAVENOUS | Status: AC
  Administered 2023-12-05: 2000 mL via INTRAVENOUS

## 2023-12-05 MED ORDER — LACTATED RINGERS IV BOLUS: 1000.0000 mL | Freq: Once | INTRAVENOUS | Status: DC

## 2023-12-05 MED ORDER — MIDODRINE HCL 5 MG PO TABS
5.0000 mg | ORAL_TABLET | ORAL | Status: AC
  Administered 2023-12-05: 5 mg via ORAL
  Filled 2023-12-05: qty 1

## 2023-12-05 NOTE — Progress Notes (Signed)
   12/05/23 2156  BH[facility] Triage Screening (Walk-ins at [facility] only)  How Did You Hear About Us ? Self  What Is the Reason for Your Visit/Call Today? Pt presents to [facility] as a voluntary walk-in, unaccompanied requesting substance abuse treatment. Pt reports using crack and meth today. Pt reports that he does not use either regularly. Pt reports experiencing homelessness at this time. Pt was recently seen at [facility] yesterday and was discharged with resources. Pt currently denies SI,HI,AVH and alcohol use.  How Long Has This Been Causing You Problems? <Week  Have You Recently Had Any Thoughts About Hurting Yourself? No  Are You Planning to Commit Suicide/Harm Yourself At This time? No  Have you Recently Had Thoughts About Hurting Someone Sherral? No  Are You Planning To Harm Someone At This Time? No  Physical Abuse Denies  Verbal Abuse Denies  Sexual Abuse Denies  Exploitation of patient/patient's resources Denies  Self-Neglect Denies  Possible abuse reported to:  (N/A)  Are you currently experiencing any auditory, visual or other hallucinations? No  Have You Used Any Alcohol or Drugs in the Past 24 Hours? Yes  What Did You Use and How Much? Meth and crack today, but does not use regularly  Do you have any current medical co-morbidities that require immediate attention? No  Clinician description of patient physical appearance/behavior: Cooperative, calm, oriented  What Do You Feel Would Help You the Most Today? Alcohol or Drug Use Treatment;Housing Assistance;Social Support  If access to [facility] Urgent Care was not available, would you have sought care in the Emergency Department? No  Determination of Need Routine (7 days)  Options For Referral Other: Comment;Facility-Based Crisis;Chemical Dependency Intensive Outpatient Therapy (CDIOP)

## 2023-12-05 NOTE — ED Provider Notes (Cosign Needed Addendum)
   EMERGENCY DEPARTMENT AT [facility] Provider Note   CSN: [id number] Arrival date & time: 12/05/23  1440     Patient presents with: Hypotension   Eren Ryser is a 28 y.o. male.   The history is provided by the patient and medical records. No language interpreter was used.     28 year old male here requesting for a place to stay and for drug rehab as he has no where else to go.  This is his 3 ER visits in 24 hrs.   Prior to Admission medications   Not on File    Allergies: Patient has no known allergies.    Review of Systems  Psychiatric/Behavioral:  Negative for self-injury and suicidal ideas.     Updated Vital Signs BP (!) 95/58   Pulse (!) 51   Temp 98.2 F (36.8 C)   Resp 16   SpO2 100%   Physical Exam Constitutional:      General: He is not in acute distress.    Appearance: He is well-developed.  HENT:     Head: Atraumatic.  Eyes:     Conjunctiva/sclera: Conjunctivae normal.  Musculoskeletal:     Cervical back: Normal range of motion and neck supple.  Skin:    Findings: No rash.  Neurological:     Mental Status: He is alert.     (all labs ordered are listed, but only abnormal results are displayed) Labs Reviewed - No data to display  EKG: None  Radiology: No results found.   Procedures   Medications Ordered in the ED - No data to display                                  Medical Decision Making  BP (!) 95/58   Pulse (!) 51   Temp 98.2 F (36.8 C)   Resp 16   SpO2 100%   19:49 PM  28 year old male here requesting for a place to stay and for drug rehab as he has no where else to go.  This is his 3 ER visits in 24 hrs.   Pt resting comfortably.  EMR reviewed pt has been seen and evaluated twice in the past 24 hrs.  Initially got admitted due to low BP.  However pt left AMA. Felt visit is social driven and no medical emergency identify.  Will give resources to [facility] for out pt care.      Final diagnoses:   Homelessness    ED Discharge Orders     None           Nivia Colon, PA-C 12/05/23 1516    Freddi Hamilton, MD 12/06/23 [phone]

## 2023-12-05 NOTE — ED Triage Notes (Signed)
 Patient c/o low bp so he can get into a detox center.

## 2023-12-05 NOTE — ED Notes (Addendum)
 Pt came out room mumbling about leaving.  Pt noted to have pulled out his IV and removed all monitoring equipment.  This Clinical research associate tried to explain he is being admitted d/t low blood pressure.  Pt reports he only came to the ED because he is homeless and wants resources.  Pt informed those needs would be addressed after we fix his blood pressure.  Pt walked back to his room where the EDP came to speak with him.  Pt continued to state he doesn't want to be admitted and only wants resources.  EDP informed the Pt that he can leave if he wants, but doesn't suggest it.  Pt proceeded stand up, throw his blanket down, state I know where I need to go, and walk out cursing/making comments about [facility] not helping him.       NAD and steady gait noted.

## 2023-12-05 NOTE — Discharge Instructions (Addendum)
 Please use resource provided to find help both with place to live and help with drug use.

## 2023-12-05 NOTE — ED Triage Notes (Signed)
 Pt arrived via POV states that he just needs a place to stay and a consult with social work to help him with resources.

## 2023-12-05 NOTE — ED Notes (Signed)
 Pt is aware we need a urine sample and has been provided a urinal.

## 2023-12-05 NOTE — ED Notes (Addendum)
 RN was notified BP was low in lobby. RN accessed pt, pt is axox4. BP 80/66. Pt denies dizziness/shob/cp. Charge nurse notified.

## 2023-12-05 NOTE — ED Notes (Signed)
 Pt has no complaints and reports he is here for [facility] consult and resources.

## 2023-12-05 NOTE — ED Notes (Signed)
 ED Provider at bedside.

## 2023-12-05 NOTE — Discharge Instructions (Addendum)

## 2023-12-05 NOTE — ED Provider Notes (Signed)
 Behavioral Health Urgent Care Medical Screening Exam  Patient Name: [name] MRN: [id number] Date of Evaluation: 12/05/23 Chief Complaint:   Can I stay here until I figure it out.  Diagnosis:  Final diagnoses:  Malingering  Homelessness  Substance abuse (HCC)  Aggressiveness    History of Present illness: [name] is a 28 y.o. male.  With psychiatric history of polysubstance abuse, homelessness, malingering, aggressiveness, and anxiety, who presented voluntarily as a walk in to [facility], requesting substance abuse treatment for crack and meth use today. Patient reports he does not use both substances regularly. He admits to using methamphetamines and crack cocaine occasionally but stated that he self-detoxed approximately two months ago. He denies withdrawal symptoms.    Patient was seen face to face by this provider and chart reviewed. Per chart review, patient has had 16 ED/Urgent care visits in the last 6 months. Patient has had 3 ER visits in the last 24 hours requesting for a place to stay.  Patient also presented to the [facility] yesterday with primary complaint of homelessness and seeking for a place to stay, stating he had been staying at [facility] for approximately six months but is now unable to return for another six months due to shelter policies.  He emphasized that he is not seeking detox services, but rather a place to stabilize--somewhere he can eat, sleep, and have access to air conditioning.   Tonight, patient is also asking to stay here to stabilize because  I ain't got nowhere else to stay and I see people-Mexicans, and blacks going through that door and why can't I have a bed? Patient is using a lot of foul language with loud voice, and has become belligerent. He is now requesting for a ride to [employer], [place].   Patient reports he does not take any medications and not established with an outpatient psychiatrist or therapist,  because I don't fu...ng  need them.   Patient is now out of the evaluation room in the hallway, security on standby. Patient is restless and cussing. He is demanding some socks and a ride out of this f.....ng  place.   Patient is offered resources for area homeless shelters. He scoffs at the idea, stating. I've been to all of them, I went to friends of [facility], the owner is a junkie, they are no good, just let me stay here until I can figure this out.   Patient is informed he will be discharged as he does not meet inpatient criteria and he can safely follow up with outpatient psychiatric services.  He becomes really angry and starts overturning the chairs and tables in the assessment room.. Security on standby, [employer] notified.   On evaluation, patient is alert, oriented x 4, and uncooperative. Speech is clear and coherent with increased tone. Pt appears casually dressed. Eye contact is good. Mood is anxious, angry and irritable, affect is congruent with mood. Thought process is coherent and thought content is WDL. Pt denies SI/HI/AVH. There is no objective indication that the patient is responding to internal stimuli. No delusions elicited during this assessment.    Flowsheet Row ED from 12/05/2023 in [facility] Most recent reading at 12/05/2023 10:25 PM ED from 12/05/2023 in [facility] Emergency Department at [facility] Most recent reading at 12/05/2023  3:01 PM ED from 12/05/2023 in [facility] Emergency Department at [facility] Most recent reading at 12/05/2023  1:21 AM  C-SSRS RISK CATEGORY No  Risk No Risk No Risk    Psychiatric Specialty Exam  Presentation  General Appearance:Casual  Eye Contact:Good  Speech:Clear and Coherent  Speech Volume:Increased  Handedness:Right   Mood and Affect  Mood: Irritable; Angry  Affect: Blunt   Thought Process  Thought Processes: Coherent; Goal Directed  Descriptions of Associations:Intact  Orientation:Full (Time,  Place and Person)  Thought Content:WDL  Diagnosis of Schizophrenia or Schizoaffective disorder in past: No   Hallucinations:None  Ideas of Reference:None  Suicidal Thoughts:No  Homicidal Thoughts:No   Sensorium  Memory: Immediate Fair  Judgment: Poor  Insight: Shallow   Executive Functions  Concentration: Good  Attention Span: Good  Recall: Fair  Fund of Knowledge: Fair  Language: Good   Psychomotor Activity  Psychomotor Activity: Mannerisms   Assets  Assets: Communication Skills   Sleep  Sleep: Fair  Number of hours:  6   Physical Exam: Physical Exam Constitutional:      General: He is not in acute distress.    Appearance: He is not diaphoretic.  HENT:     Nose: No congestion.  Cardiovascular:     Rate and Rhythm: Normal rate.  Pulmonary:     Effort: No respiratory distress.  Chest:     Chest wall: No tenderness.  Neurological:     Mental Status: He is alert and oriented to person, place, and time.  Psychiatric:        Attention and Perception: Attention and perception normal.        Mood and Affect: Mood is anxious. Affect is angry.        Speech: Speech normal.        Behavior: Behavior is uncooperative, agitated and aggressive.        Thought Content: Thought content normal.    Review of Systems  Constitutional:  Negative for chills, diaphoresis and fever.  HENT:  Negative for congestion.   Eyes:  Negative for discharge.  Respiratory:  Negative for cough, shortness of breath and wheezing.   Cardiovascular:  Negative for chest pain and palpitations.  Gastrointestinal:  Negative for diarrhea, nausea and vomiting.  Neurological:  Negative for dizziness, seizures, weakness and headaches.  Psychiatric/Behavioral:  Positive for substance abuse. The patient is nervous/anxious.    Blood pressure 94/66, pulse 72, temperature 98.5 F (36.9 C), temperature source Oral, resp. rate 16, SpO2 99%. There is no height or weight on file  to calculate BMI.  Musculoskeletal: Strength & Muscle Tone: within normal limits Gait & Station: normal Patient leans: N/A   [facility] MSE Discharge Disposition for Follow up and Recommendations: Based on my evaluation the patient does not appear to have an emergency medical condition and can be discharged with resources and follow up care in outpatient services for Substance Abuse Intensive Outpatient Program and area homeless shelters.  Recommend discharge and follow up with outpatient psychiatric services and area homeless shelters. Patient is seeking inpatient admission for secondary gain of shelter. He has been offered area homeless resources, and appears unwilling to follow up.   Patient denies SI/HI/AVH. Patient does not meet inpatient psychiatric admission criteria or IVC criteria at this time. There is no evidence of imminent risk of harm to self or others.   Discharge recommendations:  Please follow up with your primary care provider for all medical related needs.   Therapy: We recommend that patient participate in individual therapy to address mental health concerns.  Safety:  The patient should abstain from use of illicit substances/drugs and abuse of any medications. If  symptoms worsen or do not continue to improve or if the patient becomes actively suicidal or homicidal then it is recommended that the patient return to the closest hospital emergency department, the [facility], or call 911 for further evaluation and treatment. National Suicide Prevention Lifeline 1-800-SUICIDE or 210-046-3225.  About 988 988 offers 24/7 access to trained crisis counselors who can help people experiencing mental health-related distress. People can call or text 988 or chat 988lifeline.org for themselves or if they are worried about a loved one who may need crisis support.  Crisis Mobile: [facility]:                     917-392-0850 (for crisis response  24 hours a day) [facility] Hotline:                                            859 269 2516   Patient discharged in stable condition.  Thurman LULLA Ivans, NP 12/05/2023, 11:59 PM

## 2023-12-05 NOTE — ED Notes (Signed)
 Patient observed leaving the lobby with a steady gait, did not appear in distress but appeared agitated and requested sandwich.

## 2023-12-05 NOTE — ED Provider Notes (Signed)
 [place] EMERGENCY DEPARTMENT AT [facility] Provider Note   CSN: [id number] Arrival date & time: 12/05/23  9973     Patient presents with: Homeless   [name] is a 28 y.o. male.   28 year old male history of homelessness and substance use who presents to the emergency department with request for housing.  Patient reports that he was staying at every ministries and then unfortunately was not able to stay with them anymore due to shelter policy.  Went to behavioral health yesterday to reestablish this purpose and was discharged.  Says that he left hand to use some cocaine and came here to look for housing.  Was found to be hypotensive in triage but denies any chest pain, shortness of breath, lightheadedness or dizziness.  Says that he is hardly had anything to eat over the past 48 hours.  Drank some beer prior to arrival as well.  Does not take any antihypertensives.       Prior to Admission medications   Not on File    Allergies: Patient has no known allergies.    Review of Systems  Updated Vital Signs BP (!) 79/48   Pulse (!) 39   Temp 98.2 F (36.8 C) (Oral)   Resp 18   Ht 5' 9 (1.753 m)   Wt 63.5 kg   SpO2 100%   BMI 20.67 kg/m   Physical Exam Vitals and nursing note reviewed.  Constitutional:      General: He is not in acute distress.    Appearance: He is well-developed.  HENT:     Head: Normocephalic and atraumatic.     Right Ear: External ear normal.     Left Ear: External ear normal.     Nose: Nose normal.  Eyes:     Extraocular Movements: Extraocular movements intact.     Conjunctiva/sclera: Conjunctivae normal.     Pupils: Pupils are equal, round, and reactive to light.  Cardiovascular:     Rate and Rhythm: Regular rhythm. Bradycardia present.     Heart sounds: Normal heart sounds.  Pulmonary:     Effort: Pulmonary effort is normal. No respiratory distress.     Breath sounds: Normal breath sounds.  Musculoskeletal:      Cervical back: Normal range of motion and neck supple.     Right lower leg: No edema.     Left lower leg: No edema.  Skin:    General: Skin is warm and dry.  Neurological:     Mental Status: He is alert. Mental status is at baseline.  Psychiatric:        Mood and Affect: Mood normal.        Behavior: Behavior normal.     (all labs ordered are listed, but only abnormal results are displayed) Labs Reviewed  COMPREHENSIVE METABOLIC PANEL WITH GFR - Abnormal; Notable for the following components:      Result Value   Potassium 3.4 (*)    Glucose, Bld 101 (*)    Calcium 8.7 (*)    Total Protein 5.7 (*)    Albumin 3.2 (*)    All other components within normal limits  RAPID URINE DRUG SCREEN, HOSP PERFORMED - Abnormal; Notable for the following components:   Cocaine POSITIVE (*)    Amphetamines POSITIVE (*)    All other components within normal limits  LACTIC ACID, PLASMA  CBC WITH DIFFERENTIAL/PLATELET  ETHANOL    EKG: EKG Interpretation Date/Time:  Tuesday December 05 2023 09:04:15 EDT Ventricular  Rate:  50 PR Interval:  149 QRS Duration:  108 QT Interval:  461 QTC Calculation: 421 R Axis:   267  Text Interpretation: Sinus rhythm Right superior axis RSR' in V1 or V2, probably normal variant Confirmed by [name] [phone]) on 12/05/2023 9:12:05 AM  Radiology: No results found.   Procedures     EMERGENCY DEPARTMENT US  CARDIAC EXAM Study: Limited Ultrasound of the Heart and Pericardium  INDICATIONS:Abnormal vital signs Multiple views of the heart and pericardium were obtained in real-time with a multi-frequency probe.  PERFORMED AB:Fbdzoq IMAGES ARCHIVED?: No LIMITATIONS:  None VIEWS USED: Subcostal 4 chamber, Parasternal long axis, Parasternal short axis, Apical 4 chamber , and Inferior Vena Cava INTERPRETATION: Cardiac activity present, Pericardial effusioin absent, Normal contractility, and IVC normal   Medications Ordered in the ED  lactated ringers   bolus 2,000 mL (0 mLs Intravenous Stopped 12/05/23 1222)  midodrine  (PROAMATINE ) tablet 5 mg (5 mg Oral Given 12/05/23 1103)    Clinical Course as of 12/05/23 1516  Tue Dec 05, 2023  1217 Discussed with Dr Claudene for admission [RP]    Clinical Course User Index [RP] [name] BROCKS, MD                                 Medical Decision Making Amount and/or Complexity of Data Reviewed Labs: ordered.  Risk Prescription drug management. Decision regarding hospitalization.   [name] is a 28 year old male history of homelessness and substance use who presents to the emergency department with request for housing.  Initial Ddx:  Homelessness, secondary gain, malingering, shock, hypovolemia, medication side effect  MDM/Course:  Patient presents emergency department requesting housing.  Has a blood pressure that is quite low.  Also his bradycardic.  EKG/telemetry shows sinus rhythm that is occasionally bradycardic.  He is overall very well-appearing at this point in time and not symptomatic from his hypotension.  Says he has not eaten recently.  He was given 2 L of fluid and was still hypotensive.  Point-of-care ultrasound shows normal EF without pericardial effusion.  Was given midodrine  and had improvement of his blood pressure.  Not having any infectious symptoms to suggest septic shock.  Not have any chest pain or presyncope to suggest cardiogenic shock.  No bleeding.  Suspect it may be related to his substance use.  Was discussed with hospitalist for admission but then patient states that he wants to leave AMA.  He understands that he could have a life-threatening condition going on or could pass out and hurt himself.  Expressed that he understood and would still like to go.  Does not appear to be clinically intoxicated at this point in time and was discharged AGAINST MEDICAL ADVICE.    This patient presents to the ED for concern of complaints listed in HPI, this involves an extensive  number of treatment options, and is a complaint that carries with it a high risk of complications and morbidity. Disposition including potential need for admission considered.   Dispo: DC Home. Return precautions discussed including, but not limited to, those listed in the AVS. Allowed pt time to ask questions which were answered fully prior to dc.  Records reviewed Outpatient Clinic Notes The following labs were independently interpreted: Chemistry and show no acute abnormality I personally reviewed and interpreted cardiac monitoring: normal sinus rhythm  I personally reviewed and interpreted the pt's EKG: see above for interpretation  I have reviewed the  patients home medications and made adjustments as needed  Portions of this note were generated with [employer] dictation software. Dictation errors may occur despite best attempts at proofreading.     Final diagnoses:  Homeless  Hypotension, unspecified hypotension type    ED Discharge Orders     None          Matthew Lamar BROCKS, MD 12/05/23 [phone]

## 2023-12-05 NOTE — ED Notes (Signed)
 Pt became severely violent, throwing food in triage room when being told he was being discharged.

## 2023-12-06 ENCOUNTER — Emergency Department (HOSPITAL_COMMUNITY)
Admission: EM | Admit: 2023-12-06 | Discharge: 2023-12-06 | Disposition: A | Discharge status: Home / Self Care | Payer: Self-pay | Attending: Emergency Medicine | Admitting: Emergency Medicine

## 2023-12-06 DIAGNOSIS — Z59819 Housing instability, housed unspecified: Secondary | ICD-10-CM

## 2023-12-06 NOTE — ED Notes (Signed)
 Pt is sleeping, wants to rest.

## 2023-12-06 NOTE — ED Triage Notes (Signed)
 Pt presents to the ED via POV with complaints of homelessness and was brought in for resources for shelters. A&Ox4 at this time. Denies CP or SOB.

## 2023-12-06 NOTE — ED Provider Notes (Signed)
   EMERGENCY DEPARTMENT AT [facility] Provider Note   CSN: [id number] Arrival date & time: 12/05/23  2352     Patient presents with: Homeless   [name] is a 28 y.o. male.   The history is provided by the patient.   [name] is a 28 y.o. male who presents to the Emergency Department complaining of a place to sleep. He presents the emergency department with request for getting some rest. He requested resources for shelters to triage. On evaluation at the bedside patient states can't you just let me sleep.     Prior to Admission medications   Not on File    Allergies: Patient has no known allergies.    Review of Systems  All other systems reviewed and are negative.   Updated Vital Signs BP 100/67   Pulse (!) 48   Temp 98.2 F (36.8 C)   Resp 20   SpO2 100%   Physical Exam Vitals and nursing note reviewed.  Constitutional:      Appearance: He is well-developed.  HENT:     Head: Normocephalic and atraumatic.  Cardiovascular:     Rate and Rhythm: Normal rate and regular rhythm.  Pulmonary:     Effort: Pulmonary effort is normal. No respiratory distress.  Musculoskeletal:        General: No tenderness.  Skin:    General: Skin is warm and dry.  Neurological:     Mental Status: He is alert.     Comments: Moves all extremities symmetrically  Psychiatric:     Comments: Labile     (all labs ordered are listed, but only abnormal results are displayed) Labs Reviewed - No data to display  EKG: None  Radiology: No results found.   Procedures   Medications Ordered in the ED - No data to display                                  Medical Decision Making  Patient with history of substance use disorder here with request for a place to sleep. He also requests placement in a detox facility. Discussed that placement into detox/rehab is not an option at this time. This is his fourth ED visit in the last 24 hours. At time of  triage he asked for shelter resources. Will provide resources. Feel he is stable for discharge with outpatient resources and return precautions.     Final diagnoses:  Housing insecurity    ED Discharge Orders     None          Griselda Norris, MD 12/06/23 [phone]

## 2023-12-06 NOTE — ED Notes (Signed)
 Patient refusing to leave, security called

## 2024-03-13 NOTE — Congregational Nurse Program (Signed)
  Dept: [phone]   Congregational Nurse Program Note  Date of Encounter: 03/13/2024  Past Medical History: Past Medical History:  Diagnosis Date   Acid reflux    Asthma     Encounter Details:  Community Questionnaire - 03/13/24 1216       Questionnaire   Ask client: Do you give verbal consent for me to treat you today? Yes    Student Assistance N/A    Location Patient Served  [facility]    Encounter Setting CN site    Population Status Unhoused    Insurance Medicaid    Insurance/Financial Assistance Referral N/A    Medication N/A    Medical Provider Yes    Screening Referrals Made N/A    Medical Referrals Made Drug/Alcohol Services    Medical Appointment Completed N/A    CNP Interventions Advocate/Support;Navigate Healthcare System;Case Management;Spiritual Care;Educate;Counsel    Screenings CN Performed N/A    ED Visit Averted Yes    Life-Saving Intervention Made Yes          Client requested to see RN. Client states is interested in [place].Client has been in program before, but states he left after about two weeks. Client states he wants to receive treatment and is currently unhoused. RN called [facility] and spoke with [place]. Next available appointment is 03/20/24. RN will fax over referral form.

## 2024-03-18 ENCOUNTER — Encounter: Payer: Self-pay | Admitting: *Deleted

## 2024-03-18 NOTE — Congregational Nurse Program (Signed)
  Dept: [phone]   Congregational Nurse Program Note  Date of Encounter: 03/18/2024  Past Medical History: Past Medical History:  Diagnosis Date   Acid reflux    Asthma     Encounter Details:  Community Questionnaire - 03/18/24 1137       Questionnaire   Ask client: Do you give verbal consent for me to treat you today? Yes    Student Assistance N/A    Location Patient Served  [facility]    Encounter Setting CN site    Population Status Unhoused    Insurance Medicaid    Insurance/Financial Assistance Referral N/A    Medication N/A    Medical Provider Yes    Screening Referrals Made N/A    Medical Referrals Made N/A    Medical Appointment Completed N/A    CNP Interventions Advocate/Support    Screenings CN Performed Blood Pressure    ED Visit Averted N/A    Life-Saving Intervention Made N/A        Client came to nurse's office requesting assistance with sleep medication. He reports noise and lights makes it difficult to sleep and he wants to see about medication. Explained that medications must be given by MD. Completed triage form to see MD in [facility] clinic this Wednesday afternoon. Blood pressure 102/61, pulse 66, height 5' 9 (1.753 m), weight 144 lb 9.6 oz (65.6 kg), SpO2 96%.  Bhavya Eschete W RN CN

## 2024-03-20 ENCOUNTER — Encounter: Payer: Self-pay | Admitting: Physician Assistant

## 2024-03-20 NOTE — Progress Notes (Cosign Needed Addendum)
 Pt here to ask for sleep aid. He is upset about how he is being treated, but his conversation is tangential at times. Apparently has family issues.  Pt was at [facility] several months ago. He was on trazodone  50 mg while there, in March 2025. Advised I could not fill this.  Offered him Bendryl, gave him a box of 25 mg tabs, he took one in the office. Gave him Gatorade as well.   Has been homeless x 2 1/2 years.  He was in jail at 28 years old.  He has a job at a adult nurse, hopes to get a place soon.   He is very tired. He feels stressed by the situation, that is why he is sleeping so poorly.   Has been sober x 2 weeks.  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 He is sober now and wants to do better.   BP is a on the low side, no sx. Follow, encouraged hydration.   No other issues or concerns.      03/20/2024    2:04 PM 03/18/2024   11:36 AM 12/06/2023   12:06 AM  Vitals with BMI  Height  5' 9   Weight  144 lbs 10 oz   BMI  21.34   Systolic 96 102 100  Diastolic 65 61 67  Pulse 70 66 48   Drugs of Abuse     Component Value Date/Time   LABOPIA NONE DETECTED 12/05/2023 0918   COCAINSCRNUR POSITIVE (A) 12/05/2023 0918   LABBENZ NONE DETECTED 12/05/2023 0918   AMPHETMU POSITIVE (A) 12/05/2023 0918   THCU NONE DETECTED 12/05/2023 0918   LABBARB NONE DETECTED 12/05/2023 0918      Shona Shad, PA-C 03/20/2024 2:04 PM

## 2024-03-21 ENCOUNTER — Encounter: Payer: Self-pay | Admitting: Physician Assistant

## 2024-04-02 ENCOUNTER — Ambulatory Visit: Admitting: Physician Assistant

## 2024-04-02 ENCOUNTER — Encounter: Payer: Self-pay | Admitting: Physician Assistant

## 2024-04-02 ENCOUNTER — Other Ambulatory Visit: Payer: Self-pay

## 2024-04-02 VITALS — BP 103/68 | HR 72 | Temp 98.0°F | Ht 69.0 in | Wt 141.0 lb

## 2024-04-02 DIAGNOSIS — Z5901 Sheltered homelessness: Secondary | ICD-10-CM

## 2024-04-02 DIAGNOSIS — J0110 Acute frontal sinusitis, unspecified: Secondary | ICD-10-CM | POA: Diagnosis not present

## 2024-04-02 LAB — POC COVID19/FLU A&B COMBO
Covid Antigen, POC: NEGATIVE
Influenza A Antigen, POC: NEGATIVE
Influenza B Antigen, POC: NEGATIVE

## 2024-04-02 MED ORDER — BENZONATATE 100 MG PO CAPS
100.0000 mg | ORAL_CAPSULE | Freq: Three times a day (TID) | ORAL | 0 refills | Status: AC | PRN
  Filled 2024-04-02: qty 20, 4d supply, fill #0

## 2024-04-02 MED ORDER — FLUTICASONE PROPIONATE 50 MCG/ACT NA SUSP
2.0000 | Freq: Every day | NASAL | 6 refills | Status: AC
  Filled 2024-04-02: qty 16, 30d supply, fill #0

## 2024-04-02 MED ORDER — METHYLPREDNISOLONE ACETATE 80 MG/ML IJ SUSP
80.0000 mg | Freq: Once | INTRAMUSCULAR | Status: AC
  Administered 2024-04-02: 80 mg via INTRAMUSCULAR
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
## 2024-04-02 NOTE — Patient Instructions (Signed)
 VISIT SUMMARY:  You came in today with sinus congestion, headache, and a productive cough that has lasted for 3 days. You have nasal congestion with green discharge and a productive cough with green sputum. You do not have a fever, chills, ear pain, or facial tenderness. You recently quit smoking but smoked part of a cigarette yesterday. You are currently staying at [facility] and have Medicaid with limited ability to pay copays.  YOUR PLAN:  -ACUTE SINUSITIS: Acute sinusitis is an inflammation of the sinuses, often caused by a viral infection. Given your symptoms and negative COVID and flu tests, it is likely viral. No antibiotics are needed at this time. You received a steroid injection to reduce inflammation, and you have been prescribed medication for your cough and a nasal spray for congestion. Your prescriptions have been sent to the pharmacy. Please return for a full physical examination once you are feeling better.  INSTRUCTIONS:  Please follow up for a full physical examination once your symptoms have improved.  Sinus Infection, Adult A sinus infection, also called sinusitis, is inflammation of your sinuses. Sinuses are hollow spaces in the bones around your face. Your sinuses are located: Around your eyes. In the middle of your forehead. Behind your nose. In your cheekbones. Mucus normally drains out of your sinuses. When your nasal tissues become inflamed or swollen, mucus can become trapped or blocked. This allows bacteria, viruses, and fungi to grow, which leads to infection. Most infections of the sinuses are caused by a virus. A sinus infection can develop quickly. It can last for up to 4 weeks (acute) or for more than 12 weeks (chronic). A sinus infection often develops after a cold. What are the causes? This condition is caused by anything that creates swelling in the sinuses or stops mucus from draining. This includes: Allergies. Asthma. Infection from bacteria or  viruses. Deformities or blockages in your nose or sinuses. Abnormal growths in the nose (nasal polyps). Pollutants, such as chemicals or irritants in the air. Infection from fungi. This is rare. What increases the risk? You are more likely to develop this condition if you: Have a weak body defense system (immune system). Do a lot of swimming or diving. Overuse nasal sprays. Smoke. What are the signs or symptoms? The main symptoms of this condition are pain and a feeling of pressure around the affected sinuses. Other symptoms include: Stuffy nose or congestion that makes it difficult to breathe through your nose. Thick yellow or greenish drainage from your nose. Tenderness, swelling, and warmth over the affected sinuses. A cough that may get worse at night. Decreased sense of smell and taste. Extra mucus that collects in the throat or the back of the nose (postnasal drip) causing a sore throat or bad breath. Tiredness (fatigue). Fever. How is this diagnosed? This condition is diagnosed based on: Your symptoms. Your medical history. A physical exam. Tests to find out if your condition is acute or chronic. This may include: Checking your nose for nasal polyps. Viewing your sinuses using a device that has a light (endoscope). Testing for allergies or bacteria. Imaging tests, such as an MRI or CT scan. In rare cases, a bone biopsy may be done to rule out more serious types of fungal sinus disease. How is this treated? Treatment for a sinus infection depends on the cause and whether your condition is chronic or acute. If caused by a virus, your symptoms should go away on their own within 10 days. You may be  given medicines to relieve symptoms. They include: Medicines that shrink swollen nasal passages (decongestants). A spray that eases inflammation of the nostrils (topical intranasal corticosteroids). Rinses that help get rid of thick mucus in your nose (nasal saline  washes). Medicines that treat allergies (antihistamines). Over-the-counter pain relievers. If caused by bacteria, your health care provider may recommend waiting to see if your symptoms improve. Most bacterial infections will get better without antibiotic medicine. You may be given antibiotics if you have: A severe infection. A weak immune system. If caused by narrow nasal passages or nasal polyps, surgery may be needed. Follow these instructions at home: Medicines Take, use, or apply over-the-counter and prescription medicines only as told by your health care provider. These may include nasal sprays. If you were prescribed an antibiotic medicine, take it as told by your health care provider. Do not stop taking the antibiotic even if you start to feel better. Hydrate and humidify  Drink enough fluid to keep your urine pale yellow. Staying hydrated will help to thin your mucus. Use a cool mist humidifier to keep the humidity level in your home above 50%. Inhale steam for 10-15 minutes, 3-4 times a day, or as told by your health care provider. You can do this in the bathroom while a hot shower is running. Limit your exposure to cool or dry air. Rest Rest as much as possible. Sleep with your head raised (elevated). Make sure you get enough sleep each night. General instructions  Apply a warm, moist washcloth to your face 3-4 times a day or as told by your health care provider. This will help with discomfort. Use nasal saline washes as often as told by your health care provider. Wash your hands often with soap and water  to reduce your exposure to germs. If soap and water  are not available, use hand sanitizer. Do not smoke. Avoid being around people who are smoking (secondhand smoke). Keep all follow-up visits. This is important. Contact a health care provider if: You have a fever. Your symptoms get worse. Your symptoms do not improve within 10 days. Get help right away if: You have a  severe headache. You have persistent vomiting. You have severe pain or swelling around your face or eyes. You have vision problems. You develop confusion. Your neck is stiff. You have trouble breathing. These symptoms may be an emergency. Get help right away. Call 911. Do not wait to see if the symptoms will go away. Do not drive yourself to the hospital. Summary A sinus infection is soreness and inflammation of your sinuses. Sinuses are hollow spaces in the bones around your face. This condition is caused by nasal tissues that become inflamed or swollen. The swelling traps or blocks the flow of mucus. This allows bacteria, viruses, and fungi to grow, which leads to infection. If you were prescribed an antibiotic medicine, take it as told by your health care provider. Do not stop taking the antibiotic even if you start to feel better. Keep all follow-up visits. This is important. This information is not intended to replace advice given to you by your health care provider. Make sure you discuss any questions you have with your health care provider. Document Revised: 03/02/2021 Document Reviewed: 03/02/2021 Elsevier Patient Education  2024 [employer].

## 2024-04-05 ENCOUNTER — Other Ambulatory Visit: Payer: Self-pay

## 2024-04-05 ENCOUNTER — Emergency Department (HOSPITAL_COMMUNITY)
Admission: EM | Admit: 2024-04-05 | Discharge: 2024-04-05 | Disposition: A | Discharge status: Home / Self Care | Attending: Emergency Medicine | Admitting: Emergency Medicine

## 2024-04-05 DIAGNOSIS — Z7951 Long term (current) use of inhaled steroids: Secondary | ICD-10-CM | POA: Insufficient documentation

## 2024-04-05 DIAGNOSIS — R059 Cough, unspecified: Secondary | ICD-10-CM | POA: Diagnosis present

## 2024-04-05 DIAGNOSIS — Z87891 Personal history of nicotine dependence: Secondary | ICD-10-CM | POA: Insufficient documentation

## 2024-04-05 DIAGNOSIS — J45909 Unspecified asthma, uncomplicated: Secondary | ICD-10-CM | POA: Diagnosis not present

## 2024-04-05 DIAGNOSIS — J101 Influenza due to other identified influenza virus with other respiratory manifestations: Secondary | ICD-10-CM | POA: Insufficient documentation

## 2024-04-05 LAB — RESP PANEL BY RT-PCR (RSV, FLU A&B, COVID)  RVPGX2
Influenza A by PCR: POSITIVE — AB
Influenza B by PCR: NEGATIVE
Resp Syncytial Virus by PCR: NEGATIVE
SARS Coronavirus 2 by RT PCR: NEGATIVE

## 2024-04-05 MED ORDER — ACETAMINOPHEN 500 MG PO TABS
1000.0000 mg | ORAL_TABLET | Freq: Once | ORAL | Status: AC
  Administered 2024-04-05: 1000 mg via ORAL
  Filled 2024-04-05: qty 2

## 2024-04-05 MED ORDER — ACETAMINOPHEN 325 MG PO TABS
650.0000 mg | ORAL_TABLET | Freq: Once | ORAL | Status: DC | PRN
  Filled 2024-04-05: qty 2

## 2024-04-05 MED ORDER — ACETAMINOPHEN 500 MG PO TABS: 1000.0000 mg | ORAL_TABLET | Freq: Once | ORAL | Status: DC | PRN

## 2024-04-05 NOTE — ED Triage Notes (Signed)
 [facility] for a cough, headache, congestion.
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
## 2024-04-05 NOTE — Discharge Instructions (Signed)
Viral Illness TREATMENT   Please use Tylenol or ibuprofen for pain.  You may use 600 mg ibuprofen every 6 hours or 1000 mg of Tylenol every 6 hours.  You may choose to alternate between the 2.  This would be most effective.  Not to exceed 4 g of Tylenol within 24 hours.  Not to exceed 3200 mg ibuprofen 24 hours.    Treatment is directed at relieving symptoms. There is no cure. Antibiotics are not effective, because the infection is caused by a virus, not by bacteria. Treatment may include:  Increased fluid intake. Sports drinks offer valuable electrolytes, sugars, and fluids.  Breathing heated mist or steam (vaporizer or shower).  Eating chicken soup or other clear broths, and maintaining good nutrition.  Getting plenty of rest.  Using gargles or lozenges for comfort.  Increasing usage of your inhaler if you have asthma.  Return to work when your temperature has returned to normal.  Gargle warm salt water and spit it out for sore throat. Take benadryl to decrease sinus secretions. Continue to alternate between Tylenol and ibuprofen for pain and fever control.  Follow Up: Follow up with your primary care doctor in 5-7 days for recheck of ongoing symptoms.  Return to emergency department for emergent changing or worsening of symptoms.   

## 2024-04-15 ENCOUNTER — Other Ambulatory Visit: Payer: Self-pay

## 2024-04-25 ENCOUNTER — Encounter: Payer: Self-pay | Admitting: Emergency Medicine

## 2024-04-25 NOTE — Progress Notes (Signed)
 Patient presents because I can't sleep States he hasn't a good nights sleep recently due to noise, lights, etc Reports he has a new job next week and needs rest Reports mild cough that is improving No fever/chills/myalgias No other acute complaints  PE - T- 98, HR - 77; pulse ox - 96% Awake/alert appropriate No distress Ambulatory  Advised patient that we have limited access to meds Offered APAP + benadryl  for pain and to help with sleep at night Pt agreeable with plan

## 2024-04-26 ENCOUNTER — Emergency Department (HOSPITAL_COMMUNITY): Admission: EM | Admit: 2024-04-26 | Discharge: 2024-04-26 | Disposition: A | Discharge status: Home / Self Care

## 2024-04-26 ENCOUNTER — Encounter (HOSPITAL_COMMUNITY): Payer: Self-pay

## 2024-04-26 DIAGNOSIS — R067 Sneezing: Secondary | ICD-10-CM | POA: Diagnosis not present

## 2024-04-26 DIAGNOSIS — Z5900 Homelessness unspecified: Secondary | ICD-10-CM | POA: Diagnosis not present

## 2024-04-26 DIAGNOSIS — R051 Acute cough: Secondary | ICD-10-CM | POA: Diagnosis not present

## 2024-04-26 DIAGNOSIS — R059 Cough, unspecified: Secondary | ICD-10-CM | POA: Diagnosis present

## 2024-04-26 LAB — RESP PANEL BY RT-PCR (RSV, FLU A&B, COVID)  RVPGX2
Influenza A by PCR: NEGATIVE
Influenza B by PCR: NEGATIVE
Resp Syncytial Virus by PCR: NEGATIVE
SARS Coronavirus 2 by RT PCR: NEGATIVE

## 2024-04-26 NOTE — ED Provider Notes (Signed)
 " [facility] Provider Note   CSN: [id number] Arrival date & time: 04/26/24  9370     Patient presents with: URI   [name] is a 29 y.o. male with past medical history significant for substance abuse, current unhoused status, anxiety who presents concern for cold, cough, sneezing.  Patient reports that mostly he just wanted to get out of the cold for a little while.  He was kicked out of his homeless shelter last night.  He reports that he has a friend that he can hopefully stay with over the weekend, and he is post to start new job on Monday.  He reports that he overall is not so worried about his health and mostly wanting to get out of the cold.    URI      Prior to Admission medications  Medication Sig Start Date End Date Taking? Authorizing Provider  benzonatate  (TESSALON ) 100 MG capsule Take 1-2 capsules (100-200 mg total) by mouth 3 (three) times daily as needed. 04/02/24   Mayers, Cari S, PA-C  fluticasone  (FLONASE ) 50 MCG/ACT nasal spray Place 2 sprays into both nostrils daily. 04/02/24   Mayers, Cari S, PA-C    Allergies: Patient has no known allergies.    Review of Systems  All other systems reviewed and are negative.   Updated Vital Signs BP 106/66 (BP Location: Right Arm)   Pulse 70   Temp 97.6 F (36.4 C) (Oral)   Resp 18   SpO2 98%   Physical Exam Vitals and nursing note reviewed.  Constitutional:      General: He is not in acute distress.    Appearance: Normal appearance.  HENT:     Head: Normocephalic and atraumatic.     Mouth/Throat:     Comments: No significant posterior oropharynx erythema, swelling, exudate. Uvula midline, tonsils 1+ bilaterally.  No trismus, stridor, evidence of PTA, floor of mouth swelling or redness.   Eyes:     General:        Right eye: No discharge.        Left eye: No discharge.  Cardiovascular:     Rate and Rhythm: Normal rate and regular rhythm.     Heart sounds: No  murmur heard.    No friction rub. No gallop.  Pulmonary:     Effort: Pulmonary effort is normal.     Breath sounds: Normal breath sounds.     Comments: No wheezing, rhonchi, stridor, rales Skin:    General: Skin is warm and dry.     Capillary Refill: Capillary refill takes less than 2 seconds.  Neurological:     Mental Status: He is alert and oriented to person, place, and time.  Psychiatric:        Mood and Affect: Mood normal.        Behavior: Behavior normal.     (all labs ordered are listed, but only abnormal results are displayed) Labs Reviewed  RESP PANEL BY RT-PCR (RSV, FLU A&B, COVID)  [id number]    EKG: None  Radiology: No results found.   Procedures   Medications Ordered in the ED - No data to display                                  Medical Decision Making  This is a well-appearing 29yo male who presents with concern for 2 days of cough, sneezing.  My emergent differential diagnosis includes acute upper respiratory infection with COVID, flu, RSV versus new asthma presentation, acute bronchitis, less clinical concern for pneumonia.  Also considered other ENT emergencies, Ludwig angina, strep pharyngitis, mono, versus epiglottis, tonsillitis versus other.  This is not an exhaustive differential.  On my exam patient is overall well-appearing, they have temperature of 97.6, breathing unlabored, no tachypnea, no respiratory distress, stable oxygen saturation.  Patient without tachycardia. RVP independently reviewed by myself shows negative for covid, flu, rsv.  Patient symptoms are consistent with viral upper respiratory infection, encouraged ibuprofen , Tylenol , rest, plenty of fluids.  Discussed extensive return precautions.     Possible that patient does have a viral upper respiratory infection, but overall feel that his major reason for medical evaluation today is to get out of the cold.  Vital signs stable in the emergency department, patient monitored for a brief  amount of time prior to being discharged in stable condition.  Patient discharged in stable condition at this time.   Final diagnoses:  Acute cough  Unhoused person    ED Discharge Orders     None          Rosan Sherlean DEL, [place] 04/26/24 0755    Ula Prentice SAUNDERS, MD 04/26/24 1553  "

## 2024-04-26 NOTE — ED Notes (Signed)
 Patient refused discharged papers , states he is just really cold and is homeless, upset that he was discharged. '

## 2024-04-26 NOTE — Discharge Instructions (Signed)
 Your workup today showed no evidence of COVID, flu, RSV.  Your lungs sounded clear on my exam.  You can use ibuprofen , Tylenol , drink plenty of fluids, rest.
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
